# Patient Record
Sex: Male | Born: 1970 | Race: Black or African American | Hispanic: No | Marital: Single | Smoking: Former smoker
Health system: Southern US, Community
[De-identification: ages and names within clinical notes are randomized; demographics above are authoritative.]

## PROBLEM LIST (undated history)

## (undated) DIAGNOSIS — I10 Essential (primary) hypertension: Secondary | ICD-10-CM

## (undated) DIAGNOSIS — J45909 Unspecified asthma, uncomplicated: Secondary | ICD-10-CM

## (undated) DIAGNOSIS — F209 Schizophrenia, unspecified: Secondary | ICD-10-CM

## (undated) DIAGNOSIS — F319 Bipolar disorder, unspecified: Secondary | ICD-10-CM

## (undated) HISTORY — PX: KNEE SURGERY: SHX244

---

## 2005-07-16 ENCOUNTER — Emergency Department (HOSPITAL_COMMUNITY): Admission: EM | Admit: 2005-07-16 | Discharge: 2005-07-16 | Payer: Self-pay | Admitting: Emergency Medicine

## 2006-03-30 ENCOUNTER — Emergency Department (HOSPITAL_COMMUNITY): Admission: EM | Admit: 2006-03-30 | Discharge: 2006-03-30 | Payer: Self-pay | Admitting: Emergency Medicine

## 2006-10-28 ENCOUNTER — Emergency Department (HOSPITAL_COMMUNITY): Admission: EM | Admit: 2006-10-28 | Discharge: 2006-10-28 | Payer: Self-pay | Admitting: Emergency Medicine

## 2007-04-14 ENCOUNTER — Emergency Department: Payer: Self-pay | Admitting: Unknown Physician Specialty

## 2008-01-08 ENCOUNTER — Emergency Department (HOSPITAL_COMMUNITY): Admission: EM | Admit: 2008-01-08 | Discharge: 2008-01-08 | Payer: Self-pay | Admitting: Emergency Medicine

## 2008-09-23 ENCOUNTER — Emergency Department (HOSPITAL_COMMUNITY): Admission: EM | Admit: 2008-09-23 | Discharge: 2008-09-23 | Payer: Self-pay | Admitting: Emergency Medicine

## 2008-11-13 ENCOUNTER — Other Ambulatory Visit: Payer: Self-pay

## 2008-11-14 ENCOUNTER — Ambulatory Visit: Payer: Self-pay | Admitting: Psychiatry

## 2008-11-15 ENCOUNTER — Inpatient Hospital Stay (HOSPITAL_COMMUNITY): Admission: AD | Admit: 2008-11-15 | Discharge: 2008-11-16 | Payer: Self-pay | Admitting: Psychiatry

## 2010-10-06 ENCOUNTER — Inpatient Hospital Stay (HOSPITAL_COMMUNITY)
Admission: RE | Admit: 2010-10-06 | Discharge: 2010-10-09 | Payer: Self-pay | Source: Home / Self Care | Attending: Psychiatry | Admitting: Psychiatry

## 2010-10-14 LAB — URINALYSIS, MICROSCOPIC ONLY
Bilirubin Urine: NEGATIVE
Ketones, ur: NEGATIVE mg/dL
Leukocytes, UA: NEGATIVE
Nitrite: NEGATIVE
Protein, ur: 30 mg/dL — AB
Specific Gravity, Urine: 1.008 (ref 1.005–1.030)
Urine Glucose, Fasting: NEGATIVE mg/dL
Urine-Other: NONE SEEN
Urobilinogen, UA: 0.2 mg/dL (ref 0.0–1.0)
pH: 6 (ref 5.0–8.0)

## 2010-10-14 LAB — CBC
HCT: 43.2 % (ref 39.0–52.0)
Hemoglobin: 14.7 g/dL (ref 13.0–17.0)
MCH: 32.7 pg (ref 26.0–34.0)
MCHC: 34 g/dL (ref 30.0–36.0)
MCV: 96 fL (ref 78.0–100.0)
Platelets: 143 10*3/uL — ABNORMAL LOW (ref 150–400)
RBC: 4.5 MIL/uL (ref 4.22–5.81)
RDW: 13.5 % (ref 11.5–15.5)
WBC: 6.5 10*3/uL (ref 4.0–10.5)

## 2010-10-14 LAB — DRUGS OF ABUSE SCREEN W/O ALC, ROUTINE URINE
Amphetamine Screen, Ur: NEGATIVE
Barbiturate Quant, Ur: NEGATIVE
Benzodiazepines.: NEGATIVE
Cocaine Metabolites: NEGATIVE
Creatinine,U: 55.2 mg/dL
Marijuana Metabolite: NEGATIVE
Methadone: NEGATIVE
Opiate Screen, Urine: NEGATIVE
Phencyclidine (PCP): NEGATIVE
Propoxyphene: NEGATIVE

## 2010-10-14 LAB — COMPREHENSIVE METABOLIC PANEL
ALT: 18 U/L (ref 0–53)
AST: 21 U/L (ref 0–37)
Albumin: 3.7 g/dL (ref 3.5–5.2)
Alkaline Phosphatase: 60 U/L (ref 39–117)
BUN: 14 mg/dL (ref 6–23)
CO2: 30 mEq/L (ref 19–32)
Calcium: 8.6 mg/dL (ref 8.4–10.5)
Chloride: 106 mEq/L (ref 96–112)
Creatinine, Ser: 1.27 mg/dL (ref 0.4–1.5)
GFR calc Af Amer: >60 mL/min (ref >60)
GFR calc non Af Amer: >60 mL/min (ref >60)
Glucose, Bld: 96 mg/dL (ref 70–99)
Potassium: 4.6 mEq/L (ref 3.5–5.1)
Sodium: 140 mEq/L (ref 135–145)
Total Bilirubin: 0.5 mg/dL (ref 0.3–1.2)
Total Protein: 6.8 g/dL (ref 6.0–8.3)

## 2010-10-14 LAB — TSH: TSH: 1.491 u[IU]/mL (ref 0.350–4.500)

## 2010-10-14 LAB — ETHANOL: Alcohol, Ethyl (B): <5 mg/dL (ref 0–10)

## 2010-12-17 ENCOUNTER — Emergency Department (HOSPITAL_COMMUNITY)
Admission: EM | Admit: 2010-12-17 | Discharge: 2010-12-17 | Disposition: A | Discharge status: Home / Self Care | Payer: Medicaid Other | Attending: Emergency Medicine | Admitting: Emergency Medicine

## 2010-12-17 DIAGNOSIS — F209 Schizophrenia, unspecified: Secondary | ICD-10-CM | POA: Insufficient documentation

## 2010-12-17 LAB — ETHANOL: Alcohol, Ethyl (B): <5 mg/dL (ref 0–10)

## 2010-12-17 LAB — URINALYSIS, ROUTINE W REFLEX MICROSCOPIC
Bilirubin Urine: NEGATIVE
Glucose, UA: NEGATIVE mg/dL
Hgb urine dipstick: NEGATIVE
Ketones, ur: NEGATIVE mg/dL
Nitrite: NEGATIVE
Protein, ur: NEGATIVE mg/dL
Specific Gravity, Urine: 1.014 (ref 1.005–1.030)
Urobilinogen, UA: 0.2 mg/dL (ref 0.0–1.0)
pH: 7.5 (ref 5.0–8.0)

## 2010-12-17 LAB — RAPID URINE DRUG SCREEN, HOSP PERFORMED
Amphetamines: NOT DETECTED
Barbiturates: NOT DETECTED
Benzodiazepines: NOT DETECTED
Cocaine: NOT DETECTED
Opiates: NOT DETECTED
Tetrahydrocannabinol: NOT DETECTED

## 2010-12-17 LAB — CBC
HCT: 44 % (ref 39.0–52.0)
Hemoglobin: 14.9 g/dL (ref 13.0–17.0)
MCH: 32.2 pg (ref 26.0–34.0)
MCHC: 33.9 g/dL (ref 30.0–36.0)
MCV: 95 fL (ref 78.0–100.0)
Platelets: 163 10*3/uL (ref 150–400)
RBC: 4.63 MIL/uL (ref 4.22–5.81)
RDW: 12.8 % (ref 11.5–15.5)
WBC: 5.7 10*3/uL (ref 4.0–10.5)

## 2010-12-17 LAB — BASIC METABOLIC PANEL
BUN: 17 mg/dL (ref 6–23)
CO2: 26 mEq/L (ref 19–32)
Calcium: 9 mg/dL (ref 8.4–10.5)
Chloride: 105 mEq/L (ref 96–112)
Creatinine, Ser: 1.35 mg/dL (ref 0.4–1.5)
GFR calc Af Amer: >60 mL/min (ref >60)
GFR calc non Af Amer: 59 mL/min — ABNORMAL LOW (ref >60)
Glucose, Bld: 102 mg/dL — ABNORMAL HIGH (ref 70–99)
Potassium: 4.1 mEq/L (ref 3.5–5.1)
Sodium: 138 mEq/L (ref 135–145)

## 2010-12-17 LAB — DIFFERENTIAL
Basophils Absolute: 0 10*3/uL (ref 0.0–0.1)
Basophils Relative: 1 % (ref 0–1)
Eosinophils Absolute: 0.4 10*3/uL (ref 0.0–0.7)
Eosinophils Relative: 7 % — ABNORMAL HIGH (ref 0–5)
Lymphocytes Relative: 52 % — ABNORMAL HIGH (ref 12–46)
Lymphs Abs: 3 10*3/uL (ref 0.7–4.0)
Monocytes Absolute: 0.3 10*3/uL (ref 0.1–1.0)
Monocytes Relative: 5 % (ref 3–12)
Neutro Abs: 2 10*3/uL (ref 1.7–7.7)
Neutrophils Relative %: 35 % — ABNORMAL LOW (ref 43–77)

## 2011-01-14 LAB — BASIC METABOLIC PANEL
BUN: 7 mg/dL (ref 6–23)
CO2: 28 mEq/L (ref 19–32)
Calcium: 9.2 mg/dL (ref 8.4–10.5)
Chloride: 99 mEq/L (ref 96–112)
Creatinine, Ser: 1.01 mg/dL (ref 0.4–1.5)
GFR calc Af Amer: >60 mL/min (ref >60)
GFR calc non Af Amer: >60 mL/min (ref >60)
Glucose, Bld: 106 mg/dL — ABNORMAL HIGH (ref 70–99)
Potassium: 3.9 mEq/L (ref 3.5–5.1)
Sodium: 136 mEq/L (ref 135–145)

## 2011-01-14 LAB — DIFFERENTIAL
Basophils Absolute: 0 10*3/uL (ref 0.0–0.1)
Basophils Relative: 0 % (ref 0–1)
Eosinophils Absolute: 0.1 10*3/uL (ref 0.0–0.7)
Eosinophils Relative: 1 % (ref 0–5)
Lymphocytes Relative: 41 % (ref 12–46)
Lymphs Abs: 2.5 10*3/uL (ref 0.7–4.0)
Monocytes Absolute: 0.6 10*3/uL (ref 0.1–1.0)
Monocytes Relative: 9 % (ref 3–12)
Neutro Abs: 3.1 10*3/uL (ref 1.7–7.7)
Neutrophils Relative %: 49 % (ref 43–77)

## 2011-01-14 LAB — CBC
HCT: 45.5 % (ref 39.0–52.0)
Hemoglobin: 15.6 g/dL (ref 13.0–17.0)
MCHC: 34.4 g/dL (ref 30.0–36.0)
MCV: 97.4 fL (ref 78.0–100.0)
Platelets: 151 10*3/uL (ref 150–400)
RBC: 4.67 MIL/uL (ref 4.22–5.81)
RDW: 13.4 % (ref 11.5–15.5)
WBC: 6.3 10*3/uL (ref 4.0–10.5)

## 2011-01-14 LAB — RAPID URINE DRUG SCREEN, HOSP PERFORMED
Amphetamines: NOT DETECTED
Barbiturates: NOT DETECTED
Benzodiazepines: NOT DETECTED
Cocaine: NOT DETECTED
Opiates: NOT DETECTED
Tetrahydrocannabinol: NOT DETECTED

## 2011-01-14 LAB — ETHANOL: Alcohol, Ethyl (B): <5 mg/dL (ref 0–10)

## 2011-02-11 NOTE — H&P (Signed)
[name], York             ACCOUNT NO.:  [id number]   MEDICAL RECORD NO.:  [id number]          PATIENT TYPE:  IPS   LOCATION:  0406                          FACILITY:  [facility]   PHYSICIAN:  Anselm Jungling, MD  DATE OF BIRTH:  November 16, 1970   DATE OF ADMISSION:  11/14/2008  DATE OF DISCHARGE:                       PSYCHIATRIC ADMISSION ASSESSMENT   TIME:  11:55 a.m.   IDENTIFYING INFORMATION:  A 40 year old single male.  This is a  voluntary admission.   HISTORY OF PRESENT ILLNESS:  First [facility] admission for this 40 year old  who presented in the emergency room accompanied by police after he had  called them several times during the day.  On the prior day that he had  been to mental health a couple of times looking for help,  recognizing  that he needed to take his medications and had gotten them filled at  mental health, but not yet started them.  He says today that he stopped  his medications back in October, has not been taking anything regularly.  Wanted to see if he could function well without them.  Says that when he  does not take his medications, he begins to see things, hear things that  people are talking about him.  He also feels that people are looking at  him and getting ideas about him, that they are plotting against him.  He  does have some insight and recognizes that he has paranoia and would  like to get back on his medications.  He was pacing and mildly agitated  in the emergency room, and required 20 mg of Geodon.  He has been calm  and cooperative here on the unit.   PAST PSYCHIATRIC HISTORY:  First [facility] admission.  He is followed at the  [facility] by Dr. Hortencia Pilar.  He reports a history of  schizoaffective disorder.  He denies a history of substance abuse.  He  denies a history of suicide attempts.  He has a history of prior  admissions at [facility] last in 2007.   SOCIAL HISTORY:  Single male, lives in his own apartment.   Parents live  nearby.  He says they are supportive.  Feels that he has good  relationships with his family.  No income.  No job at this time, but  states that he would like to work.  Does have Medicaid insurance.   FAMILY HISTORY:  Not available.   MEDICAL HISTORY:  No regular primary care Ryan York.   MEDICAL PROBLEMS:  He denies any medical problems or somatic complaints.   CURRENT MEDICATIONS:  1. Risperdal 4 mg tab 1 tab q.h.s. and 1/2 tablet during the day.  2. Depakote 500 mg in the morning and 1500 mg at bedtime.  3. Cogentin, dose not clear.   DRUG ALLERGIES:  NONE.   PHYSICAL EXAMINATION:  Physical exam was done in the emergency room as  noted in the record.  He initially presented there on November 13, 2008  and was unable to get a bed until late on November 14, 2008.  While in  the emergency room, he  received 20 mg of Geodon then later Risperdal 2  mg along with Depakote 500 mg this morning, November 15, 2008.   LABORATORY DATA:  Diagnostic studies were done in the emergency room.  His BMET was within normal limits.  His urine drug screen negative for  all substances.  Ethanol level less than 5.  CBC within normal limits.   MENTAL STATUS EXAM:  Fully alert male, cooperative with a guarded  affect.  Speech is normal, nonpressured.  No response latency.  Candid  about his history.  Recognizes that he has hallucinations.  Recognizes  paranoia.  Somewhat embarrassed about his behavior.  Mood is neutral  today.  Initially he is quite guarded, but warms up quickly with  conversation and becomes less guarded.  More forthcoming.  He has been  up and moving about the unit cooperating and participating well in all  activities.  Thought processes are logical.  He does not appear to be  internally distracted at this time.  No delusional thoughts.  Denying  any suicidal or homicidal thought or dangerous ideas.  Cognition is  completely preserved.  Immediate recent and remote memory  are intact.  Insight fairly good.   AXIS I:  Bipolar disorder versus schizoaffective disorder.  AXIS II:  Deferred.  AXIS III:  No diagnosis.  AXIS IV:  Deferred.  AXIS V:  Current 46, past year not known.   PLAN:  The plan is to voluntarily admit him to stabilize him.  Our case  manager will contact  family and hear their concerns.  He is on our  stabilization team.  We are going to evaluate his support system and get  some additional history.  I have started him back on Depakote 1000 mg  q.h.s. and 500 q.a.m., Risperdal 2 mg p.o. q.a.m. and 4 mg p.o. q.h.s.,  and Cogentin 1 mg b.i.d.      Ryan York. Lorin Picket, N.P.      Anselm Jungling, MD  Electronically Signed    MAS/MEDQ  D:  11/15/2008  T:  11/15/2008  Job:  [id number]

## 2011-06-24 LAB — CBC
HCT: 45.3
Hemoglobin: 15.4
MCHC: 34
MCV: 95.1
Platelets: 141 — ABNORMAL LOW
RBC: 4.77
RDW: 12.8
WBC: 3.8 — ABNORMAL LOW

## 2011-06-24 LAB — DIFFERENTIAL
Basophils Absolute: 0
Basophils Relative: 0
Eosinophils Absolute: 0.3
Eosinophils Relative: 8 — ABNORMAL HIGH
Lymphocytes Relative: 25
Lymphs Abs: 0.9
Monocytes Absolute: 0.3
Monocytes Relative: 9
Neutro Abs: 2.1
Neutrophils Relative %: 57

## 2011-06-24 LAB — BASIC METABOLIC PANEL
BUN: 19
CO2: 25
Calcium: 8.8
Chloride: 105
Creatinine, Ser: 1.33
GFR calc Af Amer: >60
GFR calc non Af Amer: >60
Glucose, Bld: 106 — ABNORMAL HIGH
Potassium: 3.6
Sodium: 138

## 2011-06-24 LAB — RAPID URINE DRUG SCREEN, HOSP PERFORMED
Amphetamines: NOT DETECTED
Barbiturates: NOT DETECTED
Benzodiazepines: POSITIVE — AB
Cocaine: NOT DETECTED
Opiates: NOT DETECTED
Tetrahydrocannabinol: NOT DETECTED

## 2011-06-24 LAB — ETHANOL: Alcohol, Ethyl (B): <5

## 2012-04-17 ENCOUNTER — Encounter (HOSPITAL_COMMUNITY): Payer: Self-pay | Admitting: Emergency Medicine

## 2012-04-17 ENCOUNTER — Emergency Department (HOSPITAL_COMMUNITY)
Admission: EM | Admit: 2012-04-17 | Discharge: 2012-04-19 | Disposition: A | Discharge status: Other Acute Inpatient Hospital | Payer: Medicaid Other | Attending: Emergency Medicine | Admitting: Emergency Medicine

## 2012-04-17 DIAGNOSIS — Z8659 Personal history of other mental and behavioral disorders: Secondary | ICD-10-CM | POA: Insufficient documentation

## 2012-04-17 DIAGNOSIS — J45909 Unspecified asthma, uncomplicated: Secondary | ICD-10-CM | POA: Insufficient documentation

## 2012-04-17 DIAGNOSIS — F17200 Nicotine dependence, unspecified, uncomplicated: Secondary | ICD-10-CM | POA: Insufficient documentation

## 2012-04-17 DIAGNOSIS — F319 Bipolar disorder, unspecified: Secondary | ICD-10-CM | POA: Insufficient documentation

## 2012-04-17 DIAGNOSIS — F29 Unspecified psychosis not due to a substance or known physiological condition: Secondary | ICD-10-CM

## 2012-04-17 HISTORY — DX: Unspecified asthma, uncomplicated: J45.909

## 2012-04-17 HISTORY — DX: Bipolar disorder, unspecified: F31.9

## 2012-04-17 HISTORY — DX: Schizophrenia, unspecified: F20.9

## 2012-04-17 LAB — CBC WITH DIFFERENTIAL/PLATELET
Basophils Absolute: 0.1 10*3/uL (ref 0.0–0.1)
Basophils Relative: 1 % (ref 0–1)
Eosinophils Absolute: 0.4 10*3/uL (ref 0.0–0.7)
Eosinophils Relative: 8 % — ABNORMAL HIGH (ref 0–5)
HCT: 40.7 % (ref 39.0–52.0)
Hemoglobin: 14.3 g/dL (ref 13.0–17.0)
Lymphocytes Relative: 43 % (ref 12–46)
Lymphs Abs: 2.4 10*3/uL (ref 0.7–4.0)
MCH: 34.1 pg — ABNORMAL HIGH (ref 26.0–34.0)
MCHC: 35.1 g/dL (ref 30.0–36.0)
MCV: 97.1 fL (ref 78.0–100.0)
Monocytes Absolute: 0.4 10*3/uL (ref 0.1–1.0)
Monocytes Relative: 7 % (ref 3–12)
Neutro Abs: 2.3 10*3/uL (ref 1.7–7.7)
Neutrophils Relative %: 41 % — ABNORMAL LOW (ref 43–77)
Platelets: 149 10*3/uL — ABNORMAL LOW (ref 150–400)
RBC: 4.19 MIL/uL — ABNORMAL LOW (ref 4.22–5.81)
RDW: 12.5 % (ref 11.5–15.5)
WBC: 5.6 10*3/uL (ref 4.0–10.5)

## 2012-04-17 LAB — BASIC METABOLIC PANEL
BUN: 14 mg/dL (ref 6–23)
CO2: 25 mEq/L (ref 19–32)
Calcium: 9 mg/dL (ref 8.4–10.5)
Chloride: 103 mEq/L (ref 96–112)
Creatinine, Ser: 1.03 mg/dL (ref 0.50–1.35)
GFR calc Af Amer: >90 mL/min (ref >90)
GFR calc non Af Amer: 90 mL/min — ABNORMAL LOW (ref >90)
Glucose, Bld: 96 mg/dL (ref 70–99)
Potassium: 4 mEq/L (ref 3.5–5.1)
Sodium: 138 mEq/L (ref 135–145)

## 2012-04-17 LAB — RAPID URINE DRUG SCREEN, HOSP PERFORMED
Amphetamines: NOT DETECTED
Barbiturates: NOT DETECTED
Benzodiazepines: NOT DETECTED
Cocaine: NOT DETECTED
Opiates: NOT DETECTED
Tetrahydrocannabinol: NOT DETECTED

## 2012-04-17 LAB — ETHANOL: Alcohol, Ethyl (B): <11 mg/dL (ref 0–11)

## 2012-04-17 MED ORDER — ONDANSETRON HCL 4 MG PO TABS: 4.0000 mg | ORAL_TABLET | Freq: Three times a day (TID) | ORAL | Status: DC | PRN

## 2012-04-17 MED ORDER — NICOTINE 21 MG/24HR TD PT24
21.0000 mg | MEDICATED_PATCH | Freq: Every day | TRANSDERMAL | Status: DC
  Administered 2012-04-17 – 2012-04-19 (×3): 21 mg via TRANSDERMAL
  Filled 2012-04-17 (×3): qty 1

## 2012-04-17 MED ORDER — ZIPRASIDONE HCL 20 MG PO CAPS
20.0000 mg | ORAL_CAPSULE | Freq: Two times a day (BID) | ORAL | Status: DC
  Administered 2012-04-17 – 2012-04-19 (×5): 20 mg via ORAL
  Filled 2012-04-17 (×4): qty 1

## 2012-04-17 MED ORDER — ALUM & MAG HYDROXIDE-SIMETH 200-200-20 MG/5ML PO SUSP: 30.0000 mL | ORAL | Status: DC | PRN

## 2012-04-17 MED ORDER — QUETIAPINE FUMARATE 100 MG PO TABS
100.0000 mg | ORAL_TABLET | Freq: Two times a day (BID) | ORAL | Status: DC
  Administered 2012-04-17 – 2012-04-19 (×5): 100 mg via ORAL
  Filled 2012-04-17 (×5): qty 1

## 2012-04-17 NOTE — ED Notes (Signed)
Pt states voices tell him to sleep with women and to chase his children around the house.

## 2012-04-17 NOTE — ED Notes (Signed)
Telepsych in progress. 

## 2012-04-17 NOTE — BH Assessment (Signed)
Assessment Note   Ryan York is an 41 y.o. male who presented to [facility] Emergency Department with the chief complaint of experiencing auditory hallucinations. Patient reported to Clinical research associate that he was diagnosed with Schizophrenia in 1995, stating "That's when I started hearing the voices". Patient stated to writer that he hears 20 different voices in his head that command him to hurt himself. "I live in a hostile environment full of druggies and prostitutes. No one ever told the truth about Schizophrenia and telepathy. The voices be trying to inflict pain and I can't take it no more." Patient reported that his only way of coping with the voices is by consuming his Seroquel because it puts him to sleep. Patient stated that the voices are "interrogating me. They wanna see me go to jail or make babies with your girl." Patient reported to Clinical research associate that he began hearing the voices through objects such as the speaker or electronics beginning in 1999. Patient desires for the voices to stop and to have his medications evaluated. Patient denies SI/HI/VH at this time. Patient has received past inpatient treatment before at [facility], [facility], and [facility].   Axis I: Schizophrenia, Bipolar Disorder  Axis II: Deferred Axis III:  Past Medical History  Diagnosis Date  . Bipolar disorder   . Schizophrenia   . Asthma    Axis IV: other psychosocial or environmental problems, problems related to social environment and problems with access to health care services Axis V: 21-30 behavior considerably influenced by delusions or hallucinations OR serious impairment in judgment, communication OR inability to function in almost all areas  Past Medical History:  Past Medical History  Diagnosis Date  . Bipolar disorder   . Schizophrenia   . Asthma     Past Surgical History  Procedure Date  . Knee surgery     Family History: No family history on file.  Social History:  reports that he has been smoking  Cigarettes.  He has never used smokeless tobacco. He reports that he drinks alcohol. He reports that he does not use illicit drugs.  Additional Social History:  Alcohol / Drug Use Pain Medications: See MAR Prescriptions: See MAR Over the Counter: See MAR History of alcohol / drug use?: No history of alcohol / drug abuse (Pt denies to assessor)  CIWA: CIWA-Ar BP: 121/81 mmHg Pulse Rate: 63  COWS:    Allergies: No Known Allergies  Home Medications:  (Not in a hospital admission)  OB/GYN Status:  No LMP for male patient.  General Assessment Data Location of Assessment: [facility] ED Living Arrangements: Alone Can pt return to current living arrangement?: Yes Admission Status: Voluntary Is patient capable of signing voluntary admission?: Yes Transfer from: Acute Hospital Referral Source: Self/Family/Friend  Education Status Is patient currently in school?: No  Risk to self Suicidal Ideation: No Suicidal Intent: No Is patient at risk for suicide?: No Suicidal Plan?: No Access to Means: No What has been your use of drugs/alcohol within the last 12 months?: Pt denies SA use Previous Attempts/Gestures: No How many times?: 0  Other Self Harm Risks: None Reported Triggers for Past Attempts: None known Intentional Self Injurious Behavior: None Family Suicide History: No Recent stressful life event(s): Other (Comment) (Recent severe Auditory Hallucinations) Persecutory voices/beliefs?: No Depression: Yes Depression Symptoms: Loss of interest in usual pleasures;Feeling worthless/self pity;Isolating Substance abuse history and/or treatment for substance abuse?: No Suicide prevention information given to non-admitted patients: Not applicable  Risk to Others Homicidal Ideation: No Thoughts of  Harm to Others: No Current Homicidal Intent: No Current Homicidal Plan: No Access to Homicidal Means: No Identified Victim: N/A History of harm to others?: No Assessment of Violence: None  Noted Violent Behavior Description: Pt is calm and cooperative Does patient have access to weapons?: No Criminal Charges Pending?: No (Pt is on probation ) Does patient have a court date: No  Psychosis Hallucinations: Auditory;With command (Voices command pt to hurt himself) Delusions: None noted  Mental Status Report Appear/Hygiene: Disheveled Eye Contact: Poor Motor Activity: Freedom of movement Speech: Logical/coherent Level of Consciousness: Quiet/awake Mood: Apprehensive Affect: Appropriate to circumstance Anxiety Level: None Thought Processes: Coherent;Relevant Judgement: Impaired Orientation: Person;Time;Place;Situation Obsessive Compulsive Thoughts/Behaviors: None  Cognitive Functioning Concentration: Decreased Memory: Recent Intact;Remote Intact IQ: Average Insight: Fair Impulse Control: Poor Appetite: Good Weight Loss: 0  Weight Gain: 0  Sleep: Increased Total Hours of Sleep: 16  Vegetative Symptoms: None  ADLScreening [facility] Assessment Services) Patient's cognitive ability adequate to safely complete daily activities?: Yes Patient able to express need for assistance with ADLs?: Yes Independently performs ADLs?: Yes  Abuse/Neglect [facility]) Physical Abuse: Denies Verbal Abuse: Denies Sexual Abuse: Denies  Prior Inpatient Therapy Prior Inpatient Therapy: Yes Prior Therapy Dates:  (Dates unknown ) Prior Therapy Facilty/Provider(s): [facility], [facility] Facility in [place] Reason for Treatment: Psychosis  Prior Outpatient Therapy Prior Outpatient Therapy: No  ADL Screening (condition at time of admission) Patient's cognitive ability adequate to safely complete daily activities?: Yes Patient able to express need for assistance with ADLs?: Yes Independently performs ADLs?: Yes Weakness of Legs: None Weakness of Arms/Hands: None  Home Assistive Devices/Equipment Home Assistive Devices/Equipment: None  Therapy Consults (therapy consults require a physician  order) PT Evaluation Needed: No OT Evalulation Needed: No SLP Evaluation Needed: No Abuse/Neglect Assessment (Assessment to be complete while patient is alone) Physical Abuse: Denies Verbal Abuse: Denies Sexual Abuse: Denies Exploitation of patient/patient's resources: Denies Self-Neglect: Denies Values / Beliefs Cultural Requests During Hospitalization: None Spiritual Requests During Hospitalization: None Consults Spiritual Care Consult Needed: No Social Work Consult Needed: No      Additional Information 1:1 In Past 12 Months?: No CIRT Risk: No Elopement Risk: No Does patient have medical clearance?: Yes     Disposition: Referral for inpatient treatment program. Disposition Disposition of Patient: Inpatient treatment program Type of inpatient treatment program: Adult  On Site Evaluation by:   Reviewed with Physician:     Paulino Door, Shifa Brisbon C 04/17/2012 1:14 PM

## 2012-04-17 NOTE — ED Notes (Signed)
[id number]> Expected date:<BR> Expected time:<BR> Means of arrival:<BR> Comments:<BR>

## 2012-04-17 NOTE — ED Notes (Addendum)
Belongings received. Three bags placed in LOCKER 26 and one large duffle bag at desk.

## 2012-04-17 NOTE — ED Notes (Signed)
Call placed to Telepsych around 12 noon

## 2012-04-17 NOTE — ED Notes (Signed)
Pt placed in blue scrubs via [employer]. Security called to wand pt and belongings.

## 2012-04-17 NOTE — ED Notes (Signed)
ACT assessment completed 

## 2012-04-17 NOTE — ED Provider Notes (Addendum)
History     CSN: [id number]  Arrival date & time 04/17/12  0123   First MD Initiated Contact with Patient 04/17/12 [phone]      Chief Complaint  Patient presents with  . Hearing voices     (Consider location/radiation/quality/duration/timing/severity/associated sxs/prior treatment) HPI Level V caveat: This psychotic, poverty of speech. This is a 41 year old black male with a long-standing history of schizophrenia. He states he continues to hear voices despite being on Seroquel. He is not sure why he came in particularly this morning as there has not been an acute change. He is vague about what the voices say to him. He is reticent to give a more detailed history, stating that being in the hospital environment distracts him from being able to carry on a conversation.  Past Medical History  Diagnosis Date  . Bipolar disorder   . Schizophrenia   . Asthma     Past Surgical History  Procedure Date  . Knee surgery     No family history on file.  History  Substance Use Topics  . Smoking status: Current Some Day Smoker  . Smokeless tobacco: Never Used  . Alcohol Use: Yes      Review of Systems  Unable to perform ROS   Allergies  Review of patient's allergies indicates no known allergies.  Home Medications   Current Outpatient Rx  Name Route Sig Dispense Refill  . QUETIAPINE FUMARATE 100 MG PO TABS Oral Take 100 mg by mouth 2 (two) times daily.       BP 138/87  Pulse 60  Temp 97.3 F (36.3 C) (Oral)  Resp 20  SpO2 93%  Physical Exam General: Well-developed, well-nourished male in no acute distress; appearance consistent with age of record HENT: normocephalic, atraumatic Eyes: pupils equal round and reactive to light; extraocular muscles intact Neck: supple Heart: regular rate and rhythm Lungs: clear to auscultation bilaterally Abdomen: soft; nondistended Extremities: No deformity; full range of motion Neurologic: Awake, alert; motor function intact in all  extremities and symmetric; no facial droop Skin: Warm and dry Psychiatric: Auditory hallucinations; poverty of speech; flat affect    ED Course  Procedures (including critical care time)     MDM   Nursing notes and vitals signs, including pulse oximetry, reviewed.  Summary of this visit's results, reviewed by myself:  Labs:  Results for orders placed during the hospital encounter of 04/17/12  ETHANOL      Component Value Range   Alcohol, Ethyl (B) <11  0 - 11 mg/dL  URINE RAPID DRUG SCREEN (HOSP PERFORMED)      Component Value Range   Opiates NONE DETECTED  NONE DETECTED   Cocaine NONE DETECTED  NONE DETECTED   Benzodiazepines NONE DETECTED  NONE DETECTED   Amphetamines NONE DETECTED  NONE DETECTED   Tetrahydrocannabinol NONE DETECTED  NONE DETECTED   Barbiturates NONE DETECTED  NONE DETECTED  BASIC METABOLIC PANEL      Component Value Range   Sodium 138  135 - 145 mEq/L   Potassium 4.0  3.5 - 5.1 mEq/L   Chloride 103  96 - 112 mEq/L   CO2 25  19 - 32 mEq/L   Glucose, Bld 96  70 - 99 mg/dL   BUN 14  6 - 23 mg/dL   Creatinine, Ser 1.19  0.50 - 1.35 mg/dL   Calcium 9.0  8.4 - 14.7 mg/dL   GFR calc non Af Amer 90 (*) >90 mL/min   GFR calc Af Amer >90  >  90 mL/min  CBC WITH DIFFERENTIAL      Component Value Range   WBC 5.6  4.0 - 10.5 K/uL   RBC 4.19 (*) 4.22 - 5.81 MIL/uL   Hemoglobin 14.3  13.0 - 17.0 g/dL   HCT 98.1  19.1 - 47.8 %   MCV 97.1  78.0 - 100.0 fL   MCH 34.1 (*) 26.0 - 34.0 pg   MCHC 35.1  30.0 - 36.0 g/dL   RDW 29.5  62.1 - 30.8 %   Platelets 149 (*) 150 - 400 K/uL   Neutrophils Relative 41 (*) 43 - 77 %   Lymphocytes Relative 43  12 - 46 %   Monocytes Relative 7  3 - 12 %   Eosinophils Relative 8 (*) 0 - 5 %   Basophils Relative 1  0 - 1 %   Neutro Abs 2.3  1.7 - 7.7 K/uL   Lymphs Abs 2.4  0.7 - 4.0 K/uL   Monocytes Absolute 0.4  0.1 - 1.0 K/uL   Eosinophils Absolute 0.4  0.0 - 0.7 K/uL   Basophils Absolute 0.1  0.0 - 0.1 K/uL   RBC  Morphology TEARDROP CELLS     WBC Morphology ATYPICAL LYMPHOCYTES               Hanley Seamen, MD 04/17/12 [id number]  Hanley Seamen, MD 04/17/12 [phone]

## 2012-04-17 NOTE — ED Notes (Signed)
As per EMS, pt requesting mental health help.

## 2012-04-17 NOTE — ED Notes (Signed)
Pt recommended to inpatient tx per ACT team. Will pursue a bed at [facility].

## 2012-04-17 NOTE — ED Notes (Signed)
Received pt to TCU Rm 26, ambulatory.

## 2012-04-17 NOTE — ED Notes (Signed)
Pt states he is looking for adjustment in medication to help with mental health issues.

## 2012-04-17 NOTE — ED Notes (Signed)
Acuity level changed due to pt hearing voices and requesting seroquel be adjusted per Dr Read Drivers

## 2012-04-17 NOTE — ED Provider Notes (Signed)
  Physical Exam  BP 121/81  Pulse 63  Temp 97.5 F (36.4 C) (Oral)  Resp 18  SpO2 94%  Physical Exam  ED Course  Procedures  MDM patietn has had a telepsych consult. recomending inpatient treatment. Will had geodon. 20 mg po bid  Juliet Rude. Rubin Payor, MD 04/17/12 1454

## 2012-04-17 NOTE — ED Notes (Signed)
YNW:[id number]<OZ> Expected date:04/17/12<BR> Expected time: 1:07 AM<BR> Means of arrival:Ambulance<BR> Comments:<BR> Mental health issues

## 2012-04-18 NOTE — ED Provider Notes (Addendum)
Pt resting quietly, nad. Vitals normal. Discussed w act team - awaiting psych placement.    Suzi Roots, MD 04/18/12 [phone]  Pt alert, content, vitals normal, nad. Awaiting psych placement.   Suzi Roots, MD 04/19/12 [phone]

## 2012-04-19 ENCOUNTER — Encounter (HOSPITAL_COMMUNITY): Payer: Self-pay | Admitting: Emergency Medicine

## 2012-04-19 ENCOUNTER — Inpatient Hospital Stay (HOSPITAL_COMMUNITY)
Admission: AD | Admit: 2012-04-19 | Discharge: 2012-04-27 | DRG: 885 | Disposition: A | Discharge status: Home / Self Care | Payer: Medicaid Other | Source: Ambulatory Visit | Attending: Psychiatry | Admitting: Psychiatry

## 2012-04-19 DIAGNOSIS — J45909 Unspecified asthma, uncomplicated: Secondary | ICD-10-CM | POA: Diagnosis present

## 2012-04-19 DIAGNOSIS — F259 Schizoaffective disorder, unspecified: Principal | ICD-10-CM | POA: Diagnosis present

## 2012-04-19 DIAGNOSIS — F250 Schizoaffective disorder, bipolar type: Secondary | ICD-10-CM

## 2012-04-19 DIAGNOSIS — F1020 Alcohol dependence, uncomplicated: Secondary | ICD-10-CM | POA: Diagnosis present

## 2012-04-19 MED ORDER — BENZTROPINE MESYLATE 1 MG PO TABS: 1.0000 mg | ORAL_TABLET | Freq: Two times a day (BID) | ORAL | Status: DC | PRN

## 2012-04-19 MED ORDER — QUETIAPINE FUMARATE ER 300 MG PO TB24
300.0000 mg | ORAL_TABLET | Freq: Every day | ORAL | Status: DC
  Administered 2012-04-19 – 2012-04-26 (×8): 300 mg via ORAL
  Filled 2012-04-19 (×10): qty 1

## 2012-04-19 MED ORDER — ALUM & MAG HYDROXIDE-SIMETH 200-200-20 MG/5ML PO SUSP: 30.0000 mL | ORAL | Status: DC | PRN

## 2012-04-19 MED ORDER — ACETAMINOPHEN 325 MG PO TABS
650.0000 mg | ORAL_TABLET | Freq: Four times a day (QID) | ORAL | Status: DC | PRN
  Administered 2012-04-23 – 2012-04-25 (×2): 650 mg via ORAL

## 2012-04-19 MED ORDER — MAGNESIUM HYDROXIDE 400 MG/5ML PO SUSP: 30.0000 mL | Freq: Every day | ORAL | Status: DC | PRN

## 2012-04-19 NOTE — [facility] Counselor (Signed)
Patient accepted to [facility] by Lynann Bologna, NP to Dr. Harvie Heck Reidling. Patient's bed assignment is 402-2. Writer informed the EDP-Dr. Linwood Dibbles of patients disposition. Also informed patient's nurse of the disposition. Patient's support paperwork completed and faxed to [facility].

## 2012-04-19 NOTE — Progress Notes (Signed)
Patient ID: Ryan York, male   DOB: September 28, 1971, 41 y.o.   MRN: [id number] Pt admitted to [facility] for Schizophrenia and depression. Pt with hx of multiple inpatient admissions. Pt is voluntary, stating he "came in because the voices got so bad". Pt states he has been hearing command voices telling him to "[facility], you're not wanted here". Pt also states the voices are telling him to sleep with multiple partners so he can have many children. Pt states he feels them (voices) touch him at times as well. Pt denies SI or HI at this time. Pt pleasant and cooperative during assessment, brightens on approach but endorses depression. Pt currently homeless and states his family does not live in this area. Pt declines to add any emergency contacts at this time stating he does not want to worry his mom. Pt oriented to unit rules and is agreeable to them. Will monitor patient Q 15 minutes for safety.

## 2012-04-19 NOTE — Tx Team (Signed)
Initial Interdisciplinary Treatment Plan  PATIENT STRENGTHS: (choose at least two) Ability for insight Active sense of humor Average or above average intelligence Capable of independent living Communication skills General fund of knowledge Motivation for treatment/growth  PATIENT STRESSORS: Financial difficulties   PROBLEM LIST: Problem List/Patient Goals Date to be addressed Date deferred Reason deferred Estimated date of resolution  Schizophrenia 04/19/12     Depression 04/19/12                                                DISCHARGE CRITERIA:  Adequate post-discharge living arrangements Improved stabilization in mood, thinking, and/or behavior Motivation to continue treatment in a less acute level of care Need for constant or close observation no longer present Safe-care adequate arrangements made Verbal commitment to aftercare and medication compliance  PRELIMINARY DISCHARGE PLAN: Placement in alternative living arrangements  PATIENT/FAMIILY INVOLVEMENT: This treatment plan has been presented to and reviewed with the patient, Ryan York, and/or family member, .  The patient and family have been given the opportunity to ask questions and make suggestions.  [name] 04/19/2012, 8:21 PM

## 2012-04-20 ENCOUNTER — Encounter (HOSPITAL_COMMUNITY): Payer: Self-pay | Admitting: Psychiatry

## 2012-04-20 DIAGNOSIS — F250 Schizoaffective disorder, bipolar type: Secondary | ICD-10-CM | POA: Diagnosis present

## 2012-04-20 DIAGNOSIS — F1010 Alcohol abuse, uncomplicated: Secondary | ICD-10-CM

## 2012-04-20 MED ORDER — LORAZEPAM 2 MG/ML IJ SOLN
INTRAMUSCULAR | Status: AC
  Filled 2012-04-20: qty 1

## 2012-04-20 MED ORDER — LORAZEPAM 1 MG PO TABS
ORAL_TABLET | ORAL | Status: AC
  Filled 2012-04-20: qty 2

## 2012-04-20 MED ORDER — HALOPERIDOL 5 MG PO TABS: 5.0000 mg | ORAL_TABLET | Freq: Three times a day (TID) | ORAL | Status: DC | PRN

## 2012-04-20 MED ORDER — HALOPERIDOL 5 MG PO TABS
ORAL_TABLET | ORAL | Status: AC
  Filled 2012-04-20: qty 2

## 2012-04-20 MED ORDER — LORAZEPAM 2 MG/ML IJ SOLN: 2.0000 mg | INTRAMUSCULAR | Status: DC | PRN

## 2012-04-20 MED ORDER — HALOPERIDOL 5 MG PO TABS
10.0000 mg | ORAL_TABLET | ORAL | Status: DC | PRN
  Administered 2012-04-20: 10 mg via ORAL

## 2012-04-20 MED ORDER — LORAZEPAM 1 MG PO TABS
2.0000 mg | ORAL_TABLET | ORAL | Status: DC | PRN
  Administered 2012-04-20 – 2012-04-26 (×7): 2 mg via ORAL
  Filled 2012-04-20 (×2): qty 2
  Filled 2012-04-20 (×2): qty 1
  Filled 2012-04-20 (×3): qty 2

## 2012-04-20 MED ORDER — HALOPERIDOL LACTATE 5 MG/ML IJ SOLN
5.0000 mg | Freq: Three times a day (TID) | INTRAMUSCULAR | Status: DC | PRN
  Filled 2012-04-20: qty 2

## 2012-04-20 MED ORDER — HALOPERIDOL LACTATE 5 MG/ML IJ SOLN: 10.0000 mg | INTRAMUSCULAR | Status: DC | PRN

## 2012-04-20 NOTE — Progress Notes (Addendum)
D: Pt in room, sitting on side of bed and looking out the window on approach. Appears blunted and anxious. MHT had approached writer stating the pt had been making inappropriate comments in the hallway and asked that pt be assessed. Pt did state he had gotten upset in hallway, but said, "Im straight now, I just need to rebuke these demons around here.". Continues to endorse AH and does not feel they are any better or worse. Offered PRN for psychotic agitation, but pt refused. Denies SI/HI/VH and contracts for safety.  A: Support and encouragement provided. Safety has been maintained with Q15 minute observation. Writer reminded pt he is in a psych hospital with pts that have many of the same issues as he does and we need him to remain calm so as not to scare his peers. States he will be fine and just needs to unwind. Will monitor closely and if he does not calm, writer will strongly recommend prn.  R: Pt remains safe. Pt remained in room and did appear to calm during interaction. Will continue to monitor closely for psychotic agitation and treat as indicated by MD order. Will continue current POC.   Pt became extremely psychotic and agitated. Was accussing staff and peers of being drug dealers and prostitutes. Believed MHT was his baby's momma. He refused PO prn as he felt like he could self regulate, but writer saw no evidence of this. Discussed with Mallie Darting, PA and prn orders were given for PO or IM if needed. A show of force was displayed and pt reluctantly agreed to take PO without incident.  Writer took HS medication to pt and he appeared to much more calm. Did not appear to be in any acute distress. Will continue to monitor.

## 2012-04-20 NOTE — Progress Notes (Signed)
Patient has been distressed today over the loss of his personal belongings.  His belongings were never sent over from [facility].  He became agitated and called the [employer] re the bag and they responded twice.  The phones had to be cut off so patient wouldn't continually call 911.  He became agitated to the point staff advised him to take some medication which he refused.  After locating the bag, patient went through his belongings and stated that everything was there.  Patient lives at [facility], but wants to leave.  He also wants to disassociate himself from [facility].  He also talked about doing away with his [address].  Patient would like to find another place to stay.  He remains with AH; told staff that he didn't want to stay in the Day Room with "all those prostitutes."  He has flat affect; he can be redirected when agitated.  He denies any SI/HI.    Continue to monitor medication management and MD orders.  15 minutes safety checks maintained.  Patient interacts with staff; has been easily redirected today.

## 2012-04-20 NOTE — [facility] Suicide Risk Assessment (Signed)
Suicide Risk Assessment  Admission Assessment     Demographic factors:  Assessment Details Time of Assessment: Admission Information Obtained From: Patient Current Mental Status:    Loss Factors:  Loss Factors: Financial problems / change in socioeconomic status Historical Factors:    Risk Reduction Factors:  Risk Reduction Factors: Positive coping skills or problem solving skills  CLINICAL FACTORS:   Severe Anxiety and/or Agitation Schizophrenia:   Command hallucinatons Paranoid or undifferentiated type More than one psychiatric diagnosis Currently Psychotic Unstable or Poor Therapeutic Relationship Previous Psychiatric Diagnoses and Treatments  COGNITIVE FEATURES THAT CONTRIBUTE TO RISK:  Closed-mindedness Loss of executive function    SUICIDE RISK:   Mild:  Suicidal ideation of limited frequency, intensity, duration, and specificity.  There are no identifiable plans, no associated intent, mild dysphoria and related symptoms, good self-control (both objective and subjective assessment), few other risk factors, and identifiable protective factors, including available and accessible social support.  PLAN OF CARE: The patient would benefit being started on a long-acting IM antipsychotic medication secondary to history of medication noncompliance  and patient being agitated, refusing to participate in outpatient treatment Will adjust patient's medications to help remit the psychosis Patient to participate in groups Patient will undergo education in regards to his diagnoses treatment and the need for medication compliance    Ryan York 04/20/2012, 11:21 AM

## 2012-04-20 NOTE — Progress Notes (Signed)
Psychoeducational Group Note  Date:  04/20/2012 Time:  1100 Group Topic/Focus:  Recovery Goals:   The focus of this group is to identify appropriate goals for recovery and establish a plan to achieve them.  Participation Level:  Did Not Attend  Participation Quality:  Did not attend group  Affect:  Did not attend group  Cognitive:  Did not attend group  Insight:  Did not attend group  Engagement in Group:  Did not attend group  Additional Comments:  Pt walked in group once group ended.  Karleen Hampshire Brittini 04/20/2012, 2:18 PM

## 2012-04-20 NOTE — Discharge Planning (Signed)
Met with patient in Aftercare Planning Group and provided today's workbook based on theme of the day.  Patient has been living in his own apartment at [facility] and states he does not want to return there.  He would like Case Manager to find him a room in the country somewhere.  Until then, he wants to stay at a homeless shelter.    Patient stated he is angry with his [facility] and wants to not work with them anymore.  He did sign consent for [place] to get his medication list and to speak with the ACT Team.  Patient asks to return to [place] mental health clinic [place], stating that he was with them from 1995-2009.  He appeared to be unaware that it has changed names and staff.  Patient reported that he is hearing at least 20 voices, and that he wants to kill some of them.  Later he stated there were specific people who give him a hard time that he would like to kill.  Case Manager sent request for records to [facility] and when those records arrived, gave them to the doctor.  Also called and verified that his only psychiatric medication is Seroquel 100 mg BID.  The ACT Team Leader Vincente Poli also reported to Case Manager that from time to time patient goes through this type of period of feeling he wants to stop working with the ACT Team.  The last time he was asked why, the ACT Team Leader quoted and warned Case Manager it was graphic, that he said, "'cause I ain't getting no p*ssy, I ain't getting no *ss, and I ain't getting no h*ad."  In the past when he has wanted to move or has wanted to stop ACTT services, very quickly he has changed his mind.  We made arrangements for the ACT Team Leader to come visit patient.  It was also shared that patient had said to ACTT staff several days ago that he wanted to go to the hospital because he had run out of food.  They immediately took him to several food banks to get food, but patient is now swearing that he has cleared his entire room out.  The  other thing that they wanted [place] to know is that patient has been drinking alcohol fairly regularly.  Ambrose Mantle, LCSW 04/20/2012, 2:14 PM

## 2012-04-20 NOTE — Tx Team (Signed)
Interdisciplinary Treatment Plan Update (Adult)  Date:  04/20/2012  Time Reviewed:  10:15AM-11:15AM  Progress in Treatment: Attending groups:  Yes Participating in groups:  Yes   Taking medication as prescribed:    Yes Tolerating medication:   Yes Family/Significant other contact made:  Not yet Patient understands diagnosis:   Yes, fair insight, poor judgment Discussing patient identified problems/goals with staff:   Yes Medical problems stabilized or resolved:   Yes Denies suicidal/homicidal ideation:  Yes Issues/concerns per patient self-inventory:   Missing possessions between [facility] ED and here at [facility] Other:    New problem(s) identified: Yes, Describe:  missing possessions, states he will run away from this area if he is put on an injection again, just like he ran away last time ("too much at one time")  Reason for Continuation of Hospitalization: Delusions  Depression Hallucinations Homicidal ideation Medication stabilization  Interventions implemented related to continuation of hospitalization:  Medication monitoring and adjustment, safety checks Q15 min., suicide risk assessment, group therapy, psychoeducation, collateral contact, aftercare planning, ongoing physician assessments, medication education  Additional comments:  Very upset, states that his [place] [employer] dufflebag is missing along with his Obamaphone, clippers, 15 pr boxers, socks, and more  Estimated length of stay:  3-5 days  Discharge Plan:  Unknown at this time, he refuses to consider going back to his former apartment ([address]), wants to get rid of his ACT Team Nurse, adult), is interested in possibly going to [facility] in [place] goal(s):  Not applicable  Review of initial/current patient goals per problem list:   1.  Goal(s):  Medication stabilization  Met:  No  Target date:  By Discharge   As evidenced by:  Not stable  2.  Goal(s):  Reduce auditory hallucinations (with command)  to baseline.  Met:  No  Target date:  By Discharge   As evidenced by:  Still hearing about 20 voices  3.  Goal(s):  Decrease depression to no greater than 3 at discharge.  Met:  No  Target date:  By Discharge   As evidenced by:  High depression right now  4.  Goal(s):  Determine aftercare living situation and follow-up, whether to remain with [facility].  Met:  No  Target date:  By Discharge   As evidenced by:  Ongoing need  5.  Goal(s):  Deny HI for 48 hours prior to D/C.  Met:  No  Target date:  By Discharge   As evidenced by:  HI today  6.  Goal(s):  Return sleep pattern to normal, 6+ hours nightly.  Met:  No  Target date:  By Discharge   As evidenced by:  Tired, needs sleep right now  Attendees: Patient:  Ryan York  04/20/2012 10:15AM-11:15AM  Family:     Physician:  Dr. Nelly Rout 04/20/2012 10:15AM-11:15AM  Nursing:   Joslyn Devon, RN 04/20/2012 10:15AM -11:15AM   Case Manager:  Ambrose Mantle, LCSW 04/20/2012 10:15AM-11:15AM  Counselor:  Veto Kemps, MT-BC 04/20/2012 10:15AM-11:15AM  Other:   Neill Loft, RN 04/20/2012   Other:      Other:      Other:       Scribe for Treatment Team:   Sarina Ser, 04/20/2012, 10:15AM-11:15AM

## 2012-04-20 NOTE — H&P (Signed)
Psychiatric Admission Assessment Adult  Patient Identification:  Ryan York Date of Evaluation:  04/20/2012 Chief Complaint:  Schizophrenia; Bipolar Disorder NOS History of Present Illness::Ryan York is an 41 y.o. male transferred from [place] long ED. Patient presented to [facility] Emergency Department with the chief complaint of experiencing auditory hallucinations. Patient reported to Clinical research associate that he was diagnosed with Schizophrenia in 1995, stating "That's when I started hearing the voices". Patient stated to writer that he hears 20 different voices in his head that command him to hurt himself. "I live in a hostile environment full of druggies and prostitutes. No one ever told the truth about Schizophrenia and telepathy. The voices be trying to inflict pain and I can't take it no more." Patient reported that his only way of coping with the voices is by consuming his Seroquel because it puts him to sleep. Patient stated that the voices are "interrogating me. They wanna see me go to jail or make babies with my girl."  Patient reported that he began hearing the voices through objects such as the speaker or electronics beginning in 1999. Patient desires for the voices to stop and to have his medications evaluated. Patient denies SI/HI/VH at this time. Patient has a history of multiple psychiatric hospitalizations in the past and has been at [facility], [facility], and [facility].  Patient adds that he is missing him back and feels that someone has stolen it. He wants to make a report to the police to report his missing bag.   Mood Symptoms:  Depression, Hypomania/Mania, Mood Swings, Sleep, Depression Symptoms:  depressed mood, insomnia, psychomotor agitation, difficulty concentrating, disturbed sleep, (Hypo) Manic Symptoms:  Distractibility, Flight of Ideas, Impulsivity, Irritable Mood, Labiality of Mood, Anxiety Symptoms:  Excessive Worry, Psychotic Symptoms:   Delusions, Hallucinations: Auditory  PTSD Symptoms: Had a traumatic exposure in the last month:  none  Past Psychiatric History: Diagnosis:  Hospitalizations:  Outpatient Care:  Substance Abuse Care:  Self-Mutilation:  Suicidal Attempts:  Violent Behaviors:   Past Medical History:   Past Medical History  Diagnosis Date  . Bipolar disorder   . Schizophrenia   . Asthma    None. Allergies:  No Known Allergies PTA Medications: Prescriptions prior to admission  Medication Sig Dispense Refill  . QUEtiapine (SEROQUEL) 100 MG tablet Take 100 mg by mouth 2 (two) times daily.         Previous Psychotropic Medications:  Medication/Dose                 Substance Abuse History in the last 12 months:Patient has been drinking a lot of alcohol but denies any other drug use Substance Age of 1st Use Last Use Amount Specific Type  Nicotine      Alcohol      Cannabis      Opiates      Cocaine      Methamphetamines      LSD      Ecstasy      Benzodiazepines      Caffeine      Inhalants      Others:                         Consequences of Substance Abuse: Withdrawal Symptoms:   None  Social History: Current Place of Residence:  Lives in section 8 housing Place of Birth:   Family Members: Parents supportive Marital Status:  Single    Family History:  History reviewed. No  pertinent family history.  Mental Status Examination/Evaluation: Objective:  Appearance: Bizarre and Guarded  Eye Contact::  Fair  Speech:  Pressured and loud  Volume:  Increased  Mood:  Angry and Irritable  Affect:  Inappropriate and Labile  Thought Process:  Disorganized and Irrelevant  Orientation:  Other:  Place and person  Thought Content:  Delusions, Hallucinations: Auditory and Paranoid Ideation  Suicidal Thoughts:  No  Homicidal Thoughts:  No  Memory:  Immediate;   Poor  Judgement:  Poor  Insight:  Lacking  Psychomotor Activity:  Increased and Restlessness  Concentration:  Poor   Recall:  Poor  Akathisia:  No  Handed:  Right  AIMS (if indicated):     Assets:  Housing Physical Health Social Support  Sleep:  Number of Hours: 4.75     Laboratory/X-Ray Psychological Evaluation(s)      Assessment:    AXIS I:  Schizoaffective Disorder and Alcohol abuse/dependency AXIS II:  Deferred AXIS III:   Past Medical History  Diagnosis Date  . Bipolar disorder   . Schizophrenia   . Asthma    AXIS IV:  other psychosocial or environmental problems, problems related to social environment and problems with primary support group AXIS V:  31-40 impairment in reality testing  Treatment Plan/Recommendations:  Treatment Plan Summary: Daily contact with patient to assess and evaluate symptoms and progress in treatment Medication management Current Medications:  Current Facility-Administered Medications  Medication Dose Route Frequency Provider Last Rate Last Dose  . acetaminophen (TYLENOL) tablet 650 mg  650 mg Oral Q6H PRN Mickeal Skinner, MD      . alum & mag hydroxide-simeth (MAALOX/MYLANTA) 200-200-20 MG/5ML suspension 30 mL  30 mL Oral Q4H PRN Mickeal Skinner, MD      . benztropine (COGENTIN) tablet 1 mg  1 mg Oral BID PRN Mickeal Skinner, MD      . magnesium hydroxide (MILK OF MAGNESIA) suspension 30 mL  30 mL Oral Daily PRN Mickeal Skinner, MD      . QUEtiapine (SEROQUEL XR) 24 hr tablet 300 mg  300 mg Oral QHS Mickeal Skinner, MD   300 mg at 04/19/12 2124   Facility-Administered Medications Ordered in Other Encounters  Medication Dose Route Frequency Provider Last Rate Last Dose  . DISCONTD: alum & mag hydroxide-simeth (MAALOX/MYLANTA) 200-200-20 MG/5ML suspension 30 mL  30 mL Oral PRN Carlisle Beers Molpus, MD      . DISCONTD: nicotine (NICODERM CQ - dosed in mg/24 hours) patch 21 mg  21 mg Transdermal Daily Carlisle Beers Molpus, MD   21 mg at 04/19/12 0932  . DISCONTD: ondansetron (ZOFRAN) tablet 4 mg  4 mg Oral Q8H PRN Carlisle Beers Molpus, MD      . DISCONTD: QUEtiapine (SEROQUEL)  tablet 100 mg  100 mg Oral BID Carlisle Beers Molpus, MD   100 mg at 04/19/12 0931  . DISCONTD: ziprasidone (GEODON) capsule 20 mg  20 mg Oral BID WC Nathan R. Pickering, MD   20 mg at 04/19/12 1650    Observation Level/Precautions:   Every 15 minute checks  Laboratory:  Labs to be reviewed  Psychotherapy:  Patient to participate in groups   Medications:  To change Seroquel to Seroquel XR 300 mg one pill at bedtime   Routine PRN Medications:  Yes  Consultations:  None at this time   Discharge Concerns: The need for medication compliance      Ras Kollman 7/23/20132:32 PM

## 2012-04-20 NOTE — Progress Notes (Signed)
[facility] Group Notes:  (Counselor/Nursing/MHT/Case Management/Adjunct)  04/20/2012 1:03 PM  Type of Therapy:  Group Therapy  Participation Level:  Active  Participation Quality:  Attentive and Sharing  Affect:  Appropriate  Cognitive:  Oriented  Insight:  Limited  Engagement in Group:  Good  Engagement in Therapy:  Good  Modes of Intervention:  Clarification, Problem Solving, Support   Summary of Progress/Problems: Patient talked about needing to get away from his apartment of 8 years due to the landlord, the drug environment, etc. He wants to get off of section 8. Stated he doesn't hear voices when he is away from the apartment. Heard voices for years but nothing helps except THC. He denies any use now. Thinks he needs to be out in the country, where it is quiet and he can just walk out of his front door into daylight. He has been in group homes and doesn't like these either. Hoping to find a different place. Stated he is not in any hurry to leave the hospital.   Veto Kemps 04/20/2012, 1:03 PM

## 2012-04-20 NOTE — [facility] Counselor (Signed)
Adult Comprehensive Assessment  Patient ID: Ryan York, male   DOB: [birth year], 41 y.o.   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 get along IT trainer / Lack of resources (include bankruptcy): fixed income of SSI, food stamps and [employer] / Lack of housing: doesn't like his section 8 housing, wants to go to a shelter Physical health (include injuries & life threatening diseases): ashtma, bade left leg Social relationships: lacks social support Substance abuse: none reported Bereavement / Loss: none reported  Living/Environment/Situation:  Living Arrangements: Alone Living conditions (as described by patient or guardian): doesn't like, drug infested, landlord won't keep things up, section 8 How long has patient lived in current situation?: 8 years What is atmosphere in current home: Dangerous  Family History:  Marital status: Separated Separated, when?: separated in 29, hasn't seen since 1999. What types of issues is patient dealing with in the relationship?: n/a Does patient have children?: No  Childhood History:  By whom was/is the patient raised?: Both parents Description of patient's relationship with caregiver when they were a child: alright, he was very active and didn't have time to get in trouble Patient's description of current relationship with people who raised him/her: good, they just have to tolerate me now, working on it. Does patient have siblings?: Yes Number of Siblings: 1  (older brother) Description of patient's current relationship with siblings: good Did patient suffer any verbal/emotional/physical/sexual abuse as a child?: No Did patient suffer from severe childhood neglect?: No Has patient ever been sexually  abused/assaulted/raped as an adolescent or adult?: No Was the patient ever a victim of a crime or a disaster?: No Witnessed domestic violence?: No Has patient been effected by domestic violence as an adult?: No  Education:  Highest grade of school patient has completed: graduated high school, vocational classes on line Currently a student?: Yes Name of school: [employer] online classes How long has the patient attended?: unsure Learning disability?: No  Employment/Work Situation:   Employment situation: On disability Why is patient on disability: Schizophrenia How long has patient been on disability: unsure Patient's job has been impacted by current illness: No What is the longest time patient has a held a job?: 15 months 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 Has patient ever been in the [employer]?: No Has patient ever served in Buyer, retail?: No  Financial Resources:   Surveyor, quantity resources: [employer];Food stamps;Medicaid Does patient have a representative payee or guardian?: No  Alcohol/Substance Abuse:   What has been your use of drugs/alcohol within the last 12 months?: none reported currently, age 41-drug use If attempted suicide, did drugs/alcohol play a role in this?: No Alcohol/Substance Abuse Treatment Hx: Past Tx, Inpatient If yes, describe treatment: ADS Has alcohol/substance abuse ever caused legal problems?: Yes (age 41-possession charges, )  Social Support System:   Patient's Community Support System: Production assistant, radio System: family, [facility] Type of faith/religion: none How does patient's faith help to cope with current illness?: n/a  Leisure/Recreation:   Leisure and Hobbies: writing poetry, collect cans, watch [employer]  Strengths/Needs:   What things does the patient do well?: I talk to people and relate to people In what areas does patient struggle / problems for patient: not having a lot of energy  Discharge Plan:   Does  patient  have access to transportation?: No Plan for no access to transportation at discharge: bus Will patient be returning to same living situation after discharge?: No Plan for living situation after discharge: wants to go to a shelter Currently receiving community mental health services: Yes (From Whom) Nurse, adult, wants to go to [place]) Does patient have financial barriers related to discharge medications?: No  Summary/Recommendations:   Summary and Recommendations (to be completed by the evaluator): Patient is a 41 year old African American male with diagnosis of Schizophrenia. He was admitted with audiitory hallucinations. Patient is not compliant with medications. Patient's main goal for hospitaliztion is to find different housing. Patient will benefit from crisis stabilization, medication evaluation, group therapy and psycho-education groups to work on coping skills, cse management for referrals .  Ryan York. 04/20/2012

## 2012-04-20 NOTE — Progress Notes (Signed)
Psychoeducational Group Note  Date:  04/20/2012 Time:  2030  Group Topic/Focus:  Wrap-Up Group:   The focus of this group is to help patients review their daily goal of treatment and discuss progress on daily workbooks.  Participation Level:  Minimal  Participation Quality:  Monopolizing  Affect:  Anxious and Irritable  Cognitive:  Confused  Insight:  Limited  Engagement in Group:  Limited  Additional Comments:  Patient attended wrap-up group this evening and was respectful when the first two group members shared but when patient shared during group, patient was sharing that other members in the group were causing agitation. Patient spoke with raised voice and was not redirectable. Patient left group after sharing.  Amanpreet Delmont, Newton Pigg 04/20/2012, 8:54 PM

## 2012-04-21 DIAGNOSIS — F259 Schizoaffective disorder, unspecified: Principal | ICD-10-CM

## 2012-04-21 MED ORDER — HYDROXYZINE HCL 50 MG PO TABS: 50.0000 mg | ORAL_TABLET | Freq: Three times a day (TID) | ORAL | Status: DC | PRN

## 2012-04-21 NOTE — Discharge Planning (Signed)
Met with patient in Aftercare Planning Group and provided today's workbook based on theme of the day.  He was a little sleepy and kept snoring while other patients were talking, but was sufficiently awake to talk with Case Manager several times throughout group.  He expressed some bizarre ideas regarding being in a studio apartment, with the air conditioner and refrigerator draining or interfering with his energy.  He spoke with his ACT Team yesterday and now is consent to stay with their services, and stay at [facility] if he can get a room on the other side of the building where the traffic will not interfere with his energy, would prefer two rooms where he can get away from the air conditioner and refrigerator.  He stated he is willing to save his money toward paying off the landlord and listened openly to suggestions from Case Manager about possibly using his ACT Team to save his money.  He has now decided he does not want to lose his [address] 8 housing, and is willing to be patient in moving with the help of the ACT Team.  A [facility] Team member came to see him today again.    No case management needs today aside from utilization management.  Ambrose Mantle, LCSW 04/21/2012, 12:40 PM

## 2012-04-21 NOTE — Progress Notes (Signed)
[facility] MD Progress Note  04/21/2012 1:33 PM  Diagnosis:  Axis I: Schizo affective disorder, bipolar type  ADL's:  Impaired  Sleep: Poor  Appetite:  Fair  Suicidal Ideation: none  Homicidal Ideation:  Plan:  " I need to kill alll the drug dealers, prostitutes as they are bothering me   AEB (as evidenced by):  Mental Status Examination/Evaluation: Objective:  Appearance: Disheveled  Patent attorney::  Fair  Speech:  Pressured  Volume:  Increased  Mood:  Angry, Anxious and Irritable  Affect:  Labile  Thought Process:  Disorganized and Loose  Orientation:  Other:  Person and place  Thought Content:  Delusions and Hallucinations: Auditory Tactile  Suicidal Thoughts:  No  Homicidal Thoughts:  No  Memory:  Immediate;   Fair  Judgement:  Poor  Insight:  Absent  Psychomotor Activity:  Mannerisms and Restlessness  Concentration:  Poor  Recall:  Poor  Akathisia:  No    AIMS (if indicated):     Assets:  Desire for Improvement Physical Health  Sleep:  Number of Hours: 5.25    Vital Signs:Blood pressure 126/87, pulse 72, temperature 96.7 F (35.9 C), temperature source Oral, resp. rate 16, height 6' (1.829 m), weight 213 lb (96.616 kg). Current Medications: Current Facility-Administered Medications  Medication Dose Route Frequency Provider Last Rate Last Dose  . acetaminophen (TYLENOL) tablet 650 mg  650 mg Oral Q6H PRN Mickeal Skinner, MD      . alum & mag hydroxide-simeth (MAALOX/MYLANTA) 200-200-20 MG/5ML suspension 30 mL  30 mL Oral Q4H PRN Mickeal Skinner, MD      . benztropine (COGENTIN) tablet 1 mg  1 mg Oral BID PRN Nelly Rout, MD      . haloperidol (HALDOL) 5 MG tablet           . haloperidol (HALDOL) tablet 10 mg  10 mg Oral Q1H PRN Mickie D. Adams, PA   10 mg at 04/20/12 2115   Or  . haloperidol lactate (HALDOL) injection 10 mg  10 mg Intramuscular Q1H PRN Mickie D. Adams, PA      . hydrOXYzine (ATARAX/VISTARIL) tablet 50 mg  50 mg Oral TID PRN Nelly Rout, MD      .  LORazepam (ATIVAN) 1 MG tablet           . LORazepam (ATIVAN) 2 MG/ML injection           . LORazepam (ATIVAN) tablet 2 mg  2 mg Oral Q1H PRN Mickie D. Adams, PA   2 mg at 04/20/12 2115   Or  . LORazepam (ATIVAN) injection 2 mg  2 mg Intramuscular Q1H PRN Mickie D. Adams, PA      . magnesium hydroxide (MILK OF MAGNESIA) suspension 30 mL  30 mL Oral Daily PRN Mickeal Skinner, MD      . QUEtiapine (SEROQUEL XR) 24 hr tablet 300 mg  300 mg Oral QHS Mickeal Skinner, MD   300 mg at 04/20/12 2213  . DISCONTD: haloperidol (HALDOL) tablet 5 mg  5 mg Oral Q8H PRN Nelly Rout, MD      . DISCONTD: haloperidol lactate (HALDOL) injection 5 mg  5 mg Intramuscular Q8H PRN Nelly Rout, MD      . DISCONTD: LORazepam (ATIVAN) injection 2 mg  2 mg Intravenous Q1H PRN Mickie D. Adams, PA        Lab Results: No results found for this or any previous visit (from the past 48 hour(s)).  Physical Findings: AIMS: Facial and Oral Movements Muscles  of Facial Expression: None, normal Lips and Perioral Area: None, normal Jaw: None, normal Tongue: None, normal,Extremity Movements Upper (arms, wrists, hands, fingers): None, normal Lower (legs, knees, ankles, toes): None, normal, Trunk Movements Neck, shoulders, hips: None, normal, Overall Severity Severity of abnormal movements (highest score from questions above): None, normal Incapacitation due to abnormal movements: None, normal Patient's awareness of abnormal movements (rate only patient's report): No Awareness, Dental Status Current problems with teeth and/or dentures?: No Does patient usually wear dentures?: No  CIWA:  CIWA-Ar Total: 0  COWS:  COWS Total Score: 0   Treatment Plan Summary: Daily contact with patient to assess and evaluate symptoms and progress in treatment Medication management Patient's delusions and hallucinations the need to remit in order for him to be discharged  Plan: Continue Seroquel XL 300 mg at bedtime for psychosis To continue  Haldol by mouth/IM for agitation along with Cogentin if needed To start Vistaril 50 mg by mouth every 8 hours when necessary for anxiety/agitation Patient to participate in the program Labs reviewed today  Ryan York 04/21/2012, 1:33 PM

## 2012-04-21 NOTE — Progress Notes (Signed)
Psychoeducational Group Note  Date:  04/21/2012 Time:  1100  Group Topic/Focus:  Crisis Planning:   The purpose of this group is to help patients create a crisis plan for use upon discharge or in the future, as needed.  Participation Level:  Did Not Attend  Participation Quality:    Affect:    Cognitive:    Insight:    Engagement in Group:   Additional Comments:  Pt stated he had an prn and was feeling tired.  Isla Pence M 04/21/2012, 1:34 PM

## 2012-04-21 NOTE — Progress Notes (Signed)
[facility] Group Notes:  (Counselor/Nursing/MHT/Case Management/Adjunct)  04/21/2012 2:17 PM  Type of Therapy:  Group Therapy  Participation Level:  Did Not Attend. Patient's medications are making him drowsy.   Fleet Higham, [name] 04/21/2012, 2:17 PM

## 2012-04-21 NOTE — Progress Notes (Signed)
D.Pt. Ryan York.  Pt. Sitting in his room on his bed.  Denies SI/HI.  Denies pain.  No concerns or issues voiced at present. A.  Encouragement and support given.   R.  Pt. Remains safe.

## 2012-04-21 NOTE — Progress Notes (Signed)
D:  Pt. about on the unit today.  He did attend discharge planning group but went back to bed.  He said he was still feeling sleepy from night meds last night.  He said he got upset over some belongings last evening.  He said that he still hears some voices but said that they were not as bad as last night.  Rates his depression as an 8/10.  He denies SI/HI. A:  Offered support and encouragement.  Encouraged patient that if he has any concerns/needs that he seek out staff.  R:  Receptive to staff.  Remains on q 15 minute checks.  Will continue to monitor.

## 2012-04-21 NOTE — Progress Notes (Signed)
04/21/2012         Time: 0930      Group Topic/Focus: The focus of the group is on enhancing the patients' ability to utilize positive relaxation strategies by practicing several that can be used at discharge.  Participation Level: Did not attend  Participation Quality: Not Applicable  Affect: Not Applicable  Cognitive: Not Applicable   Additional Comments: Patient asleep.   Orest Dygert 04/21/2012 11:51 AM 

## 2012-04-21 NOTE — Progress Notes (Signed)
Psychoeducational Group Note  Date:  04/21/2012 Time:  2000  Group Topic/Focus:  Wrap-Up Group:   The focus of this group is to help patients review their daily goal of treatment and discuss progress on daily workbooks.  Participation Level:  Active  Participation Quality:  Appropriate  Affect:  Appropriate  Cognitive:  Appropriate  Insight:  Good  Engagement in Group:  Good  Additional Comments:  Pt attended wrap-up group this evening and stated that he has had a good day today.  Kaleen Odea R 04/21/2012, 9:24 PM

## 2012-04-22 MED ORDER — HALOPERIDOL 5 MG PO TABS
10.0000 mg | ORAL_TABLET | Freq: Every day | ORAL | Status: DC
  Administered 2012-04-22 – 2012-04-23 (×2): 10 mg via ORAL
  Filled 2012-04-22 (×4): qty 2

## 2012-04-22 NOTE — Progress Notes (Signed)
D: Patient denies SI/HI but is still experiencing auditory hallucinations. Patient has a preoccupied mood with an appropriate affect. Patients reports sleeping well and having a good appetite and energy level. The patient rates his depression and hopeless a 3 out of 10 (1 low/10 high). The patient is interacting on the unit more and attending groups.  A: Patient given emotional support from RN. Patient encouraged to come to staff with concerns and/or questions. Patient's medication routine continued. Patient's orders and plan of care reviewed. R: Patient remains appropriate and cooperative. Will continue to monitor patient q15 minutes for safety. Patient agreed to come to RN with any questions or concerns.

## 2012-04-22 NOTE — Progress Notes (Signed)
[facility] Group Notes:  (Counselor/Nursing/MHT/Case Management/Adjunct)  04/22/2012 11:35 AM  Type of Therapy:  Group Therapy  Participation Level:  Active  Participation Quality:  Appropriate  Affect:  Appropriate  Cognitive:  Alert and Appropriate  Insight:  Good  Engagement in Group:  Good  Engagement in Therapy:  Good  Modes of Intervention:  Problem-solving  Summary of Progress/Problems:Ryan York was able to express life changes he is interested in making to include: changing his current living situation and being open to transitioning to structured living situation such as [facility] and taking college classes at the local technical school.  Ryan York agrees to communicate and be more compliant with recommendations while work with his ACTT team.  Ryan York was able to advise a peer in group on the benefits of taking advantage of resources such as [employer], school, and not falling into situations with "club women" through speaking about life changes he would make if he were able to be 41 years old again.   Clarice Pole C 04/22/2012, 11:35 AM

## 2012-04-22 NOTE — Progress Notes (Signed)
D: Pt appears anxious and depressed. Pt stated that he is satisfied with his medication regimen. He stated that the haldol makes it easier to fight the voices off. He appears restless at times. Pt did not attend 8pm group therapy. He stated that his day so far has been up and down.  A: Support given. Verbalization encouraged. Pt encouraged to come to staff with any needs or concerns.  R: Pt is receptive. No complaints of pain or discomfort at this time. Q36min checks. Will continue to monitor.

## 2012-04-22 NOTE — Progress Notes (Signed)
[facility] Group Notes:  (Counselor/Nursing/MHT/Case Management/Adjunct)  04/22/2012 2:17 PM  Type of Therapy:  Music Therapy  Participation Level:  Active  Participation Quality:  Appropriate  Affect:  Appropriate  Cognitive:  Oriented  Insight:  Good  Engagement in Group:  Good  Engagement in Therapy:  Good  Modes of Intervention:  Activity, support, education and music  Summary of Progress/Problems: Patient was active in music therapy activities. He chose songs and reminisced about certain songs. He talked about wanting to be in a relationship where he could be comfortable and just be himself. Appropriate interactions with peers.   Vashon Arch, [name] 04/22/2012, 2:17 PM

## 2012-04-22 NOTE — Discharge Planning (Addendum)
Aftercare Planning Group  Date:  04/22/2012  Time:  8:30AM  Narrative:  Met with patient in Aftercare Planning Group and provided today's workbook based on theme of the day.  Patient stated he received a visit yesterday from his ACT Team, and that he will either go back to his apartment, or even possibly stay with the maintenance man in the apartment building while his ACT Team works to identify a new [address] location for him.  Later, patient started talking about how people have been "playing" him for 19-20 years as getting Bipolar medications even though he has Schizophrenia.  Case Manager spent some time providing psychoeducation re Schizoaffective Disorder, as many current patients do have that diagnosis.  Patient was not able to connect with this explanation, however.  Affect:  Broad  Insight:  Poor  Judgment:  Fair  Problems identified:  None  Request(s) made to Case Manager:  None  Still needed to finalize discharge plans:  Obtain appointment date/time from [facility] before discharge.  Give patient applicaition to [employer] to begin working on.  Call [facility] to inform them to follow up on [employer] application.   Ambrose Mantle, LCSW 04/22/2012 10:10 AM

## 2012-04-22 NOTE — Progress Notes (Signed)
Currently resting quietly in bed with eyes closed. Respirations are even and unlabored. No acute distress noted. Safety has been maintained with Q15 minute observation. Will continue to current POC. 

## 2012-04-22 NOTE — Progress Notes (Signed)
Patient ID: Ryan York, male   DOB: 07/14/1971, 41 y.o.   MRN: [id number] [facility] MD Progress Note  04/22/2012 12:04 PM  Diagnosis:  Axis I: Schizo affective disorder, bipolar type  ADL's:  Impaired  Sleep: Poor  Appetite:  Fair  Suicidal Ideation: none  Homicidal Ideation:  Plan:  None  AEB (as evidenced by): Patient reports his anxiety a 4/10 this morning, his auditory hallucinations a 5/10 Mental Status Examination/Evaluation: Objective:  Appearance: Disheveled  Eye Contact::  Fair  Speech:  Pressured  Volume:  Increased  Mood:  Angry, Anxious and Irritable  Affect:  Labile  Thought Process:  Disorganized and Loose  Orientation:  Other:  Person and place  Thought Content:  Delusions and Hallucinations: Auditory Tactile  Suicidal Thoughts:  No  Homicidal Thoughts:  No  Memory:  Immediate;   Fair  Judgement:  Poor  Insight:  Absent  Psychomotor Activity:  Mannerisms and Restlessness  Concentration:  Poor  Recall:  Poor  Akathisia:  No    AIMS (if indicated):     Assets:  Desire for Improvement Physical Health  Sleep:  Number of Hours: 5    Vital Signs:Blood pressure 134/92, pulse 91, temperature 97 F (36.1 C), temperature source Oral, resp. rate 16, height 6' (1.829 m), weight 213 lb (96.616 kg). Current Medications: Current Facility-Administered Medications  Medication Dose Route Frequency Provider Last Rate Last Dose  . acetaminophen (TYLENOL) tablet 650 mg  650 mg Oral Q6H PRN Mickeal Skinner, MD      . alum & mag hydroxide-simeth (MAALOX/MYLANTA) 200-200-20 MG/5ML suspension 30 mL  30 mL Oral Q4H PRN Mickeal Skinner, MD      . benztropine (COGENTIN) tablet 1 mg  1 mg Oral BID PRN Nelly Rout, MD      . haloperidol (HALDOL) tablet 10 mg  10 mg Oral Q1H PRN Mickie D. Adams, PA   10 mg at 04/20/12 2115   Or  . haloperidol lactate (HALDOL) injection 10 mg  10 mg Intramuscular Q1H PRN Mickie D. Adams, PA      . haloperidol (HALDOL) tablet 10 mg  10 mg Oral  QHS Nelly Rout, MD      . hydrOXYzine (ATARAX/VISTARIL) tablet 50 mg  50 mg Oral TID PRN Nelly Rout, MD      . LORazepam (ATIVAN) tablet 2 mg  2 mg Oral Q1H PRN Mickie D. Adams, PA   2 mg at 04/20/12 2115   Or  . LORazepam (ATIVAN) injection 2 mg  2 mg Intramuscular Q1H PRN Mickie D. Adams, PA      . magnesium hydroxide (MILK OF MAGNESIA) suspension 30 mL  30 mL Oral Daily PRN Mickeal Skinner, MD      . QUEtiapine (SEROQUEL XR) 24 hr tablet 300 mg  300 mg Oral QHS Mickeal Skinner, MD   300 mg at 04/21/12 2231    Lab Results: No results found for this or any previous visit (from the past 48 hour(s)).  Physical Findings: AIMS: Facial and Oral Movements Muscles of Facial Expression: None, normal Lips and Perioral Area: None, normal Jaw: None, normal Tongue: None, normal,Extremity Movements Upper (arms, wrists, hands, fingers): None, normal Lower (legs, knees, ankles, toes): None, normal, Trunk Movements Neck, shoulders, hips: None, normal, Overall Severity Severity of abnormal movements (highest score from questions above): None, normal Incapacitation due to abnormal movements: None, normal Patient's awareness of abnormal movements (rate only patient's report): No Awareness, Dental Status Current problems with teeth and/or dentures?: No Does patient  usually wear dentures?: No  CIWA:  CIWA-Ar Total: 0  COWS:  COWS Total Score: 0   Treatment Plan Summary: Daily contact with patient to assess and evaluate symptoms and progress in treatment Medication management Patient's delusions and hallucinations  need to remit in order for him to be discharged  Plan: Continue Seroquel XR 300 mg at bedtime for psychosis Start Haldol 10 MG by mouth each bedtime for psychosis To continue Haldol by mouth/IM for agitation along with Cogentin if needed To start Vistaril 50 mg by mouth every 8 hours when necessary for anxiety/agitation Patient to participate in the program EKG ordered for tomorrow  morning as the patient is being started on Haldol and is already on Seroquel to rule out any conduction abnormalities  Roc Streett 04/22/2012, 12:04 PM

## 2012-04-22 NOTE — Progress Notes (Signed)
Psychoeducational Group Note  Date:  04/22/2012 Time:  1100  Group Topic/Focus:  Overcoming Stress:   The focus of this group is to define stress and help patients assess their triggers.  Participation Level:  Active  Participation Quality:  Appropriate  Affect:  Appropriate  Cognitive:  Alert  Insight:  Good  Engagement in Group:  Good  Additional Comments:  Patient contributed greatly to the group subject. Pt shared a detailed list of his stressors and self developed coping skills with peers.   Koki Buxton A 04/22/2012, 11:58 PM

## 2012-04-23 NOTE — Progress Notes (Signed)
04/23/2012      Time: 0930      Group Topic/Focus: The focus of this group is on discussing various styles of communication and communicating assertively using 'I' (feeling) statements.  Participation Level: Active  Participation Quality: Redirectable  Affect: Labile  Cognitive: Alert  Additional Comments: Patient making comments about everything like it was a game show. Patient also reported that he has learned that if he "shows himself" he can get sleep medications whenever he wants. Patient reported he did so last night because he felt like staff wasn't listening to him.   Jaliah Foody 04/23/2012 10:12 AM

## 2012-04-23 NOTE — Progress Notes (Signed)
[facility] Group Notes:  (Counselor/Nursing/MHT/Case Management/Adjunct)  04/23/2012 3:40 PM  Type of Therapy:  Group Therapy  Participation Level:  Minimal  Participation Quality:  Attentive  Affect:  Blunted  Cognitive:  Oriented  Insight:  Limited  Engagement in Group:  Limited  Engagement in Therapy:  Limited  Modes of Intervention:  Education and Support  Summary of Progress/Problems: Patient came to group late. At the end of discussion on barriers, patient began to ramble and was not reality oriented. He talked about being protective of his heart and that he would hurt females.   HartisAram Beecham 04/23/2012, 3:40 PM

## 2012-04-23 NOTE — Progress Notes (Signed)
Patient ID: Ryan York, male   DOB: 06-Mar-1971, 41 y.o.   MRN: [id number] D. The patient has an anxious mood and affect, but is pleasant and cooperative. He has been obsessing about his discharge living arrangements and has asked several staff members the same questions regarding living in a group home versus an assisted living.  A. Support given and encouraged to continue working with his case manager and the treatment team to complete his discharge plan. He will probably not be discharged until next week. R. The patient is aware that he will initially return to his present home and continue working with the ACT team for further living arrangements

## 2012-04-23 NOTE — Discharge Planning (Signed)
Aftercare Planning Group   -  04/23/2012 @ 8:30AM  Narrative:  Met with patient in Aftercare Planning Group and provided today's workbook based on theme of the day.  Patient stated he did receive the information from Case Manager yesterday regarding [facility], and that he is not sure if he can apply there due to restrictions in his activities based on problems with his kneww.  Case Manager explained the process starts with a phone call to the Admissions Director, and that would let him know if he is even eligible.  Explained to him again that he will not get into [facility] from the hospital, and that his [facility] can pursue this with him.  Informed the patient that Case Manager called yesterday and spoke with the ACT Team leader, explaining his interest in [place] and how they can pursue it, giving her the web address to look it up.  Patient asked where he would stay until approved to go to [facility], and Case Manager explained he would go about his life normally.  Affect:   Somewhat broader                  Insight:   Good                  Judgment:  Fair  Problems identified:  None  Request(s) made to Case Manager:  None  Still needed to finalize discharge plans:  Obtain appointment date/time from [facility] before discharge.  Ambrose Mantle, LCSW 04/23/2012 10:02 AM

## 2012-04-23 NOTE — Progress Notes (Addendum)
Ryan York  has been seen [facility] on the [facility] hall today, tolerated fair.  Has stayed mostly to himself and in his room. He has an intense expression on his face...looks straight ahead and does not smile. He  Completed  his AM self inventory this morning, and on it he wrote he denied SI in the past 24 hrs, he rated his depression and hopelessness " 0 / 0 " and stated his DC plan was : " try to find the right meds for the daytime".      A He did attend his groups, was minimally engaged, but demonstrates difficulty in processing info given to him by counselors and case managers. He demonstrates thought blocking...gets visibly agitated when he is unable to complete his thoughts and leaves ...while trying to speak to this nurse.      R He was visibly anxious and asked for " something for my knee" this evening after dinner. Pt reports he underwent arthroscopic surgery on his left knee recently and that " it's killing me". Pt was given 2 mg ativan po, prn and 2 tylenol prn, per MD order. Pt was positioned to comfort  And able to calm down immediately after taking medicine. POC in place and safety is maintaiend as well. PD RN [facility]

## 2012-04-23 NOTE — Tx Team (Signed)
Interdisciplinary Treatment Plan Update (Adult)  Date:  04/23/2012  Time Reviewed:  10:15AM-11:15AM  Progress in Treatment: Attending groups:  Yes Participating in groups:    Some Taking medication as prescribed:    Yes Tolerating medication:   Yes Family/Significant other contact made:  Yes Patient understands diagnosis:   Yes Discussing patient identified problems/goals with staff:   Yes Medical problems stabilized or resolved:   Yes Denies suicidal/homicidal ideation:  Yes Issues/concerns per patient self-inventory:   None Other:    New problem(s) identified: No, Describe:    Reason for Continuation of Hospitalization: Hallucinations Medication stabilization Other; describe ruminations  Interventions implemented related to continuation of hospitalization:  Medication monitoring and adjustment, safety checks Q15 min., suicide risk assessment, group therapy, psychoeducation, collateral contact, aftercare planning, ongoing physician assessments, medication education  Additional comments:  Not applicable  Estimated length of stay:  3 days  Discharge Plan:  Return to live by self at [facility], follow up with [facility]  New goal(s):  Not applicable  Review of initial/current patient goals per problem list:   1.  Goal(s):  Medication stabilization  Met:  No  Target date:  By Discharge   As evidenced by:  Ongoing  2.  Goal(s):  Reduce auditory hallucinations (with command) to baseline.  Met:  No  Target date:  By Discharge   As evidenced by:  Needs more time to be ascertained to be at baseline  3.  Goal(s):  Decrease depression to no greater than 3 at discharge.  Met:  No  Target date:  By Discharge   As evidenced by:  Appears depressed  4.  Goal(s):  Determine aftercare living situation and follow-up, whether to remain with [facility].  Met:  Yes  Target date:  By Discharge   As evidenced by:  Will return to apartment, follow up with  ACT Team  5. Goal(s): Deny HI for 48 hours prior to D/C.  Met: No  Target date: By Discharge  As evidenced [id number], needs 48 hours.  6. Goal(s): Return sleep pattern to normal, 6+ hours nightly.  Met: No  Target date: By Discharge  As evidenced by: 4.75 hours last night  Attendees: Patient:  Ryan York  04/23/2012 10:15AM-11:15AM  Family:     Physician:   04/23/2012 10:15AM-11:15AM  Nursing:   Nestor Ramp, RN 04/23/2012 10:15AM -11:15AM   Case Manager:  Ambrose Mantle, LCSW 04/23/2012 10:15AM-11:15AM  Counselor:  Veto Kemps, MT-BC 04/23/2012 10:15AM-11:15AM  Other:   Verne Spurr, PA 04/23/2012   Other:      Other:      Other:       Scribe for Treatment Team:   Sarina Ser, 04/23/2012, 10:15AM-11:15AM

## 2012-04-23 NOTE — Progress Notes (Signed)
[facility] Group Notes:  (Counselor/Nursing/MHT/Case Management/Adjunct)  04/23/2012 9:34 PM  Type of Therapy:  wrap up  Participation Level:  Active  Participation Quality:  Appropriate  Affect:  Appropriate  Cognitive:  Appropriate  Insight:  Good  Engagement in Group:  Good  Engagement in Therapy:  Good  Modes of Intervention:  Education and Support  Summary of Progress/Problems: Ryan York was active in group tonight. Ryan York said that he had a good day because he got to talk to the dr about his knee. He also found out about housing options today and wants more info on them. He asked about getting a wheelchair because his knee has been hurting him.   Nichola Sizer 04/23/2012, 9:34 PM

## 2012-04-24 MED ORDER — HALOPERIDOL 2 MG PO TABS
4.0000 mg | ORAL_TABLET | Freq: Every day | ORAL | Status: DC
  Administered 2012-04-24 – 2012-04-26 (×3): 4 mg via ORAL
  Filled 2012-04-24 (×4): qty 2

## 2012-04-24 MED ORDER — HALOPERIDOL 2 MG PO TABS
2.0000 mg | ORAL_TABLET | ORAL | Status: DC
  Administered 2012-04-24 – 2012-04-27 (×10): 2 mg via ORAL
  Filled 2012-04-24 (×13): qty 1

## 2012-04-24 NOTE — Progress Notes (Signed)
Psychoeducational Group Note  Date:  04/24/2012 Time:1311  Group Topic/Focus:  Therapeutic Activity  Participation Level:  Active  Participation Quality:  Appropriate  Affect:  Appropriate  Cognitive:  Appropriate  Insight:  Good  Engagement in Group:  Good  Additional Comments: Pt attended therapeutic group with tech and peers. Pt was thrill about apple to apples games. Pt interacting with tech and peers and found the game very relaxing and helping to take his mind of stuff.  Lenox Ahr 04/24/2012, 3:12 PM

## 2012-04-24 NOTE — Progress Notes (Signed)
Psychoeducational Group Note  Date:  04/24/2012 Time:  0945 am  Group Topic/Focus:  Identifying Needs:   The focus of this group is to help patients identify their personal needs that have been historically problematic and identify healthy behaviors to address their needs.  Participation Level:  Did Not Attend   Andrena Mews 04/24/2012,10:46 AM

## 2012-04-24 NOTE — Progress Notes (Signed)
Patient ID: DELLIS VOGHT, male   DOB: 12-11-1970, 41 y.o.   MRN: [id number] D.The patient was in evening wrap up group. Initially he participated appropriately and shared that he uses walking as his method of coping. He listened and supported others as they shared their coping strategies. Then out of no where asked if God causes cancer to woman who have babies and don't tell men they fathered a child. Attempts were made to redirect him back on topic. He became agitated, verbally insulted a male patient, got up to leave group but turned to her and said to her, " Watch out I know where you live. Bang, bang!" He went to his room and was observed pacing and talking to himself. Stated, " I'm sick of African women and all their lies. I'm sick of white woman too. If you want me to drop my pants and so you can watch me jerk my dick off I will, but I'm sick of all of you woman." A. Medicated with Ativan prn for agitation and his H.S. medications. Encouraged to stay in his room until he was feeling less anxious and agitated. Reminded him that he was in a hospital and we needed to keep everyone safe.  R. Was able to follow direction and took his medication. Continued to speak to himself in an agitated manner, but made no attempts to physically harm anyone. Will continue to monitor.

## 2012-04-24 NOTE — Progress Notes (Signed)
Patient ID: Ryan York, male   DOB: 1970/10/24, 41 y.o.   MRN: [id number]  [facility] Group Notes:  (Counselor/Nursing/MHT/Case Management/Adjunct)  04/24/2012 11 AM  Type of Therapy:  Aftercare Planning, Group Therapy, Dance/Movement Therapy   Participation Level:  Active  Participation Quality:  Appropriate and Drowsy  Affect:  Appropriate  Cognitive:  Appropriate  Insight:  Limited  Engagement in Group:  Limited  Engagement in Therapy:  Limited  Modes of Intervention:  Clarification, Problem-solving, Role-play, Socialization and Support  Summary of Progress/Problems: After Care: Pt. attended and participated in aftercare planning group. Pt. accepted information on suicide prevention, warning signs to look for with suicide and crisis line numbers to use. The pt. agreed to call crisis line numbers if having warning signs or having thoughts of suicide. Pt. listed their current mood as "Feeling good like good like sunshine". Counseling:Therapist discussed the definition of self sabotage.Therapist asked group "What is your definition of self sabotage.?" and "Why do we tend to do things to sabotage ourselves?" Therapist discussed motives of sabotage and ways to turn negative thoughts into positive thoughts.  Therapist asked group to discuss self sabotaging mechanisms in their lives and how they turn the negative into positive.  Pt. stated, "Sometimes I am afraid to accept when things are going good.  I feel like something bad is going to happen.  Something positive I do is work out and exercise.        Rhunette Croft 04/24/2012. 11:50 AM

## 2012-04-24 NOTE — Progress Notes (Signed)
D Fabyan is seen  Out   in the milieu this morning.  He makes spontaneous   eye contact. He jokes with  This nurse. And .states " boy, i went to sleep last night....'. .  He interacts appropriately with the other pts.. He takes his medications as ordered..he asks " is that haldol?"  This nurse educates pt about his haldol and he is engaged in this discussion.      A he completed his self inventory  And on it he wrote he denied SI, he rated his depression and hopelessness " 3 / 3 " and stated his DC plan is to continue with " exercise and rest".      R Safety is in place and POC cont with therapeutic relationship fostered PD RN [facility]

## 2012-04-24 NOTE — Progress Notes (Addendum)
Ryan York  41 y.o.  [id number] [birth year]   Diagnosis:   Subjective: Corgan states he did not sleep well and he needed a prn medication last night to sleep. CM states that he was bragging about being able to manipulate the staff to get his medications any time he wanted them. When asked about this he states he was talking "junk" and that 4 pm is his headbanging tme.  He states he would feel better if he had medication more frequently.  Vital Signs:Blood pressure 139/72, pulse 112, temperature 97.4 F (36.3 C), temperature source Oral, resp. rate 16, height 6' (1.829 m), weight 96.616 kg (213 lb).   Objective: His speech is clear and goal directed, he is chagrinned at being questioned about his behavior but he does admit it was manipulative on his part. He states is depression is at a 4/10 today, no SI/no HI.  He reports the AH/VH are not bothering him as much but are still present.  Assessment: dx unchanged vs. Medication seeking behaviors.  Medications Scheduled:  . haloperidol  10 mg Oral QHS  . QUEtiapine  300 mg Oral QHS   PRN Meds acetaminophen, alum & mag hydroxide-simeth, benztropine, haloperidol, haloperidol lactate, hydrOXYzine, LORazepam, LORazepam, magnesium hydroxide  Plan: 1. Haldol 2mg  po TID at 8AM , 12 noon, and 4pm. 2. Haldol 4mg  at HS. 3. Seroquel 300mg  at hs. Increase to 400mg  if no improvement.  Rona Ravens. Usama H[facility] 04/24/2012 12:28 AM

## 2012-04-25 NOTE — Progress Notes (Signed)
[facility] Group Notes:  (Counselor/Nursing/MHT/Case Management/Adjunct)  04/25/2012 2:45 AM  Type of Therapy:  wrap-up  Participation Level:  Active  Participation Quality:  Appropriate  Affect:  Angry  Cognitive:  Appropriate  Insight:  Good  Engagement in Group:  Good  Engagement in Therapy:  Good  Modes of Intervention:  Education  Summary of Progress/Problems:Patient attended and participated in our group this evening. He reports that he had a good day, attended groups and socialized with peers. The patient was appropriate until the end of group he requested to ask a question. He asked what will happen to a woman who have a child and do not tell the man that they fathered a child. "will God give them some kind of disease like cancer or something". The patient became very agitated stating that he didn't expected to get answer from all women. He got up and walked out of the room. Before leaving he directed his anger to one particular patient in the room and threaten her. Patient was observed paseing and speaking to himself loud and agitated. Pt nurse spoke with him and offered him medication which he took.   Lita Mains [facility] 04/25/2012, 2:45 AM

## 2012-04-25 NOTE — Progress Notes (Signed)
Psychoeducational Group Note  Date:  04/25/2012 Time:  0945 am  Group Topic/Focus:  Making Healthy Choices:   The focus of this group is to help patients identify negative/unhealthy choices they were using prior to admission and identify positive/healthier coping strategies to replace them upon discharge.  Participation Level:  Active  Participation Quality:  Appropriate  Affect:  Blunted  Cognitive:  Alert  Insight:  Good  Engagement in Group:  Good  Additional Comments:  Pt gave supportive feedback to another pt during group  Andrena Mews 04/25/2012, 10:29 AM

## 2012-04-25 NOTE — Progress Notes (Signed)
Psychoeducational Group Note  Date:  04/25/2012 Time:  1515  Group Topic/Focus:  Goals Group:   The focus of this group is to help patients establish daily goals to achieve during treatment and discuss how the patient can incorporate goal setting into their daily lives to aide in recovery.  Participation Level:  Active  Participation Quality:  Appropriate and Attentive  Affect:  Appropriate  Cognitive:  Appropriate  Insight:  Good  Engagement in Group:  Good  Additional Comments:  Pt participated and processed to group.  Marquis Lunch, Demiana Crumbley 04/25/2012, 4:25 PM

## 2012-04-25 NOTE — Progress Notes (Signed)
Patient ID: Ryan York, male   DOB: [birth year], 41 y.o.   MRN: [id number] D. The patient was unable to attend group this evening. He was restless and pacing the hall. His speech was rapid and thoughts were loose. Vague when asked about auditory hallucinations. Instead talked about having to relocate out of [place] due to having too many painful memories here. Stated he has 30 children and none of the mothers will acknowledge him as the father. He claims he recognizes these children when he sees them and knows that they are his. Stated he has gone to houses and rang doorbells to homes where he believes a child of his might be living, but everyone lies to him. A broken spoon that had the stem twisted off was found in his room during environmental rounds. The straight stem with a pointed end was in his pocket. He stated it was to use as an imaginary cigarette because he was having cravings. A. The patient was asked to turn in the broken spoon and pockets searched for any other contraband or weapons. Offered nicotine replacement. Medicated for agitation and anxiety as well as scheduled H.S. medication. Q 15 minute checks maintained for safety.  R. The patient turned in the broken spoon handle and complied with request to empty pockets. Refused offer of nicotine replacement. Thoughts remains tangential but after medication was less anxious and not pacing the hallway.

## 2012-04-25 NOTE — Progress Notes (Signed)
D Ruffin has been seen out in the milieu..he interacts with the staff and other pts...tolerated fair. HE is pleasant and cooperative...boisterous when he approaches  This nurse to take his meds. . He attended his  AM group and completed his self inventory and on it he wrote he denied SI in the past 24 hrs, he rated his depression  And hopelessness " 2 / 2  " .      A He has attended his meals in the cafeteria, he has voiced no complaints.      R Safety is in place and POC cont with therapeutic relationship being fostered PD RN [facility]

## 2012-04-25 NOTE — Progress Notes (Signed)
Patient ID: Ryan York, male   DOB: 06-Feb-1971, 41 y.o.   MRN: [id number]  [facility] Group Notes:  (Counselor/Nursing/MHT/Case Management/Adjunct)  04/25/2012 11 AM  Type of Therapy:  Aftercare Planning, Group Therapy, Dance/Movement Therapy   Participation Level:  Active  Participation Quality:  Redirectable  Affect:  Appropriate  Cognitive:  Appropriate  Insight:  Limited  Engagement in Group:  Limited  Engagement in Therapy:  Limited  Modes of Intervention:  Clarification, Problem-solving, Role-play, Socialization and Support  Summary of Progress/Problems: After Care: Pt. attended and participated in aftercare planning group. Pt. accepted information on suicide prevention, warning signs to look for with suicide and crisis line numbers to use. The pt. agreed to call crisis line numbers if having warning signs or having thoughts of suicide. Pt. listed their mood as feeling good. Counseling:  Therapist discussed the meaning of support and how supports systems play a role in a healthy recovery..  Therapist asked what are the supports in your life and ask the group to name at least three healthy supports in their life.  Therapist processed with group in knowing the difference between unhealthy and positive supports.  Therapist processed with group ways to control the various supports in their lives.Therapist asked group what are ways to support yourself when your supports are not available when you need them.  Pt. stated "supports in my life are the hospital,mom and brother".    Rhunette Croft 04/25/2012. 3:01 PM

## 2012-04-25 NOTE — Progress Notes (Signed)
TAGEN MILBY  41 y.o.  [id number] 12-19-1970  04/24/2012   Diagnosis: Schizophrenia, Bipolar disorder  Subjective: Met with patient to discuss his symptoms.  He states he slept better, Appetite is good. He states his depression is a 2/10. Denis SI/HI, and he reports that the voices are decreasing by 80% with which he is pleased. He states his knee is sore which he feels is from playing basketball during recreation time yesterday. Vital Signs: BP 108/82 Temp: 97.5 Resp: 15 Pulse 68   Objective: His speech is clear, mood is calm, affect is congruent. Eye contact is good, and he reports his anxiety is much less today at a 2/10.   Assessment:Bipolar effective disorder improving. Pt. Is showing good response to 1st generation antipsychotic and atypical antipsychotic.  Medications Scheduled:  . haloperidol  2 mg Oral [id number]  . haloperidol  4 mg Oral QHS  . QUEtiapine  300 mg Oral QHS   PRN Meds acetaminophen, alum & mag hydroxide-simeth, benztropine, haloperidol, haloperidol lactate, hydrOXYzine, LORazepam, LORazepam, magnesium hydroxide  Plan: Continue current plan of care with no changes at this time.    Ice pack NSAID for knee. Rona Ravens. Rosbel Buckner [facility] 04/25/2012 12:05 PM

## 2012-04-25 NOTE — Progress Notes (Signed)
[facility] MD Progress Note  04/25/2012 12:00 PM  S: "I am doing great right now. I am sleeping well. The voices come off and on. I know to turn it off and stay busy, that is I keep my mind busy. No, I don't have suicidal thoughts and or suicidal thoughts".  Diagnosis:   Axis I: Schizoaffective disorder, bipolar type Axis II: Deferred Axis III:  Past Medical History  Diagnosis Date  . Bipolar disorder   . Schizophrenia   . Asthma    Axis IV: other psychosocial or environmental problems Axis V: 61-70 mild symptoms  ADL's:  Intact  Sleep: Good  Appetite:  Good  Suicidal Ideation:  Plan:  No Intent:  No Means:  no Homicidal Ideation:  Plan:  No Intent:  no Means:  No  AEB (as evidenced by): Per patient's reports.  Mental Status Examination/Evaluation: Objective:  Appearance: Casual  Eye Contact::  Good  Speech:  Clear and Coherent  Volume:  Normal  Mood:  Euthymic  Affect:  Appropriate  Thought Process:  Coherent  Orientation:  Full  Thought Content:  Rumination  Suicidal Thoughts:  No  Homicidal Thoughts:  No  Memory:  Immediate;   Good Recent;   Good Remote;   Good  Judgement:  Fair  Insight:  Fair  Psychomotor Activity:  Normal  Concentration:  Good  Recall:  Good  Akathisia:  No  Handed:  Right  AIMS (if indicated):     Assets:  Communication Skills Desire for Improvement  Sleep:  Number of Hours: 5    Vital Signs:Blood pressure 118/82, pulse 90, temperature 97.6 F (36.4 C), temperature source Oral, resp. rate 20, height 6' (1.829 m), weight 96.616 kg (213 lb). Current Medications: Current Facility-Administered Medications  Medication Dose Route Frequency Provider Last Rate Last Dose  . acetaminophen (TYLENOL) tablet 650 mg  650 mg Oral Q6H PRN Mickeal Skinner, MD   650 mg at 04/23/12 1816  . alum & mag hydroxide-simeth (MAALOX/MYLANTA) 200-200-20 MG/5ML suspension 30 mL  30 mL Oral Q4H PRN Mickeal Skinner, MD      . benztropine (COGENTIN) tablet 1 mg  1  mg Oral BID PRN Nelly Rout, MD      . haloperidol (HALDOL) tablet 10 mg  10 mg Oral Q1H PRN Mickie D. Adams, PA   10 mg at 04/20/12 2115   Or  . haloperidol lactate (HALDOL) injection 10 mg  10 mg Intramuscular Q1H PRN Mickie D. Adams, PA      . haloperidol (HALDOL) tablet 2 mg  2 mg Oral [id number] Verne Spurr, PA-C   2 mg at 04/25/12 1151  . haloperidol (HALDOL) tablet 4 mg  4 mg Oral QHS Verne Spurr, PA-C   4 mg at 04/24/12 2052  . hydrOXYzine (ATARAX/VISTARIL) tablet 50 mg  50 mg Oral TID PRN Nelly Rout, MD      . LORazepam (ATIVAN) tablet 2 mg  2 mg Oral Q1H PRN Mickie D. Adams, PA   2 mg at 04/24/12 2054   Or  . LORazepam (ATIVAN) injection 2 mg  2 mg Intramuscular Q1H PRN Mickie D. Adams, PA      . magnesium hydroxide (MILK OF MAGNESIA) suspension 30 mL  30 mL Oral Daily PRN Mickeal Skinner, MD      . QUEtiapine (SEROQUEL XR) 24 hr tablet 300 mg  300 mg Oral QHS Mickeal Skinner, MD   300 mg at 04/24/12 2051    Lab Results: No results found for this or any  previous visit (from the past 48 hour(s)).  Physical Findings: AIMS: Facial and Oral Movements Muscles of Facial Expression: None, normal Lips and Perioral Area: None, normal Jaw: None, normal Tongue: None, normal,Extremity Movements Upper (arms, wrists, hands, fingers): None, normal Lower (legs, knees, ankles, toes): None, normal, Trunk Movements Neck, shoulders, hips: None, normal, Overall Severity Severity of abnormal movements (highest score from questions above): None, normal Incapacitation due to abnormal movements: None, normal Patient's awareness of abnormal movements (rate only patient's report): No Awareness, Dental Status Current problems with teeth and/or dentures?: No Does patient usually wear dentures?: No  CIWA:  CIWA-Ar Total: 0  COWS:  COWS Total Score: 0   Treatment Plan Summary: Daily contact with patient to assess and evaluate symptoms and progress in treatment Medication management  Plan:  Continue current treatment plan.  Armandina Stammer I 04/25/2012, 12:00 PM

## 2012-04-26 NOTE — Progress Notes (Signed)
D Elvyn is seen out in the milieu this morning. He makes appropriate eye contact. He is articulate, pleasant and cooperative. HE attended his AM group as planned. He completed his AM self inventory and on it he writes he denied experiencing SI in the past 24 hrs, he rated his depression and hopelessness " 2 / 2 " and he stated his DC plan is to " exercise".       A  He is compliant with his medications and has not experienced any identifiable side effects associated with these meds.      R POC cont with safety maintained, DC planning in place and therapeutic relationship fostered. PD RN [facility]

## 2012-04-26 NOTE — Progress Notes (Signed)
04/26/2012         Time: 0930      Group Topic/Focus: The focus of this group is on discussing various aspects of wellness, balancing those aspects and exploring ways to increase the ability to experience wellness.  Participation Level: Active  Participation Quality: Attentive  Affect: Appropriate  Cognitive: Alert   Additional Comments: None.  Alayjah Boehringer 04/26/2012 12:34 PM

## 2012-04-26 NOTE — Progress Notes (Signed)
Patient ID: Ryan York, male   DOB: 1971-06-20, 41 y.o.   MRN: [id number] D: Pt. Sitting in room with light off, but speaks to Clinical research associate, notes voices have decreased with meds. " I don't even talk to them like that" Pt. Reports making a game plan for discharge tomorrow, "one that makes sense" Denies SHI. A: Staff will monitor for safety q75min. Pt. Encouraged to come to group. R: Pt. Remains safe on the unit. Pt. Attended group.

## 2012-04-26 NOTE — Treatment Plan (Signed)
Interdisciplinary Treatment Plan Update (Adult) Date: 04/26/2012  Time Reviewed: 10:55 AM  Progress in Treatment: Attending groups: Yes Participating in groups: Yes Taking medication as prescribed: Yes Tolerating medication: Yes Family/Significant othe contact made:  Patient understands diagnosis: Yes Discussing patient identified problems/goals with staff: Yes Medical problems stabilized or resolved: Yes Denies suicidal/homicidal ideation: Yes Issues/concerns per patient self-inventory: None identified Other: N/A New problem(s) identified: None Identified Reason for Continuation of Hospitalization: Depression Hallucinations Medication stabilization Interventions implemented related to continuation of hospitalization: mood stabilization, medication monitoring and adjustment, group therapy and psycho education, safety checks q 15 mins Additional comments: N/A Estimated length of stay:  Discharge Plan: SW is assessing for appropriate referrals.  Reason for Continuation of Hospitalization:  Hallucinations  Medication stabilization  Other; describe ruminations  Interventions implemented related to continuation of hospitalization: Medication monitoring and adjustment, safety checks Q15 min., suicide risk assessment, group therapy, psychoeducation, collateral contact, aftercare planning, ongoing physician assessments, medication education  Additional comments: Not applicable  Estimated length of stay: 3 days  Discharge Plan: Return to live by self at [facility], follow up with [facility]  New goal(s): Not applicable  Review of initial/current patient goals per problem list:  1. Goal(s): Medication stabilization  Met: No  Target date: By Discharge  As evidenced by: Ongoing 2. Goal(s): Reduce auditory hallucinations (with command) to baseline.  Met: No  Target date: By Discharge  As evidenced by: Needs more time to be ascertained to be at baseline 3. Goal(s): Decrease  depression to no greater than 3 at discharge.  Met: Yes  Target date: By Discharge  As evidenced by: Appears depressed 4. Goal(s): Determine aftercare living situation and follow-up, whether to remain with [facility].  Met: Yes  Target date: By Discharge  As evidenced by: Will return to apartment, follow up with [facility] 5. Goal(s): Deny HI for 48 hours prior to D/C.  Met: No  Target date: By Discharge  As evidenced XB:JYNWGN, needs 48 hours.  6. Goal(s): Return sleep pattern to normal, 6+ hours nightly.  Met: Yes  Target date: By Discharge  As evidenced by: improved sleep based on unit report of observation of pt.  Attendees: Patient: Ryan York  04/26/2012  Family:    Physician: Franchot Gallo, MD 04/26/2012 10:55 AM   Nursing: Alexia Freestone, RN   Case Manager: Clarice Pole, LCASA 04/26/2012 10:55 AM   Counselor: Veto Kemps, MT-BC 04/26/2012 10:55 AM   Other:    Other:    Other:    Other:    Scribe for Treatment Team:  Clarice Pole, LCASA 04/26/2012 10:55 AM

## 2012-04-26 NOTE — Progress Notes (Signed)
[facility] Group Notes:  (Counselor/Nursing/MHT/Case Management/Adjunct)  04/26/2012 2:28 PM  Type of Therapy:  Group Therapy  Participation Level:  Active  Participation Quality:  Resistant, monopolizing   Affect:  Angry  Cognitive:  Oriented  Insight:  None  Engagement in Group:  Limited  Engagement in Therapy:  Limited  Modes of Intervention:  Clarification, Limit-setting and Problem-solving  Summary of Progress/Problems: Patient was very negative in group talking about the Hispanics taking over and taking his job. Talked about how a black male can't make it. Patient was pointing out all problems but was not open to problem-solving. At some point, patient was standing up in the middle of the room and was very loud. He would not respond to redirection. Eventually the group had to be stopped because he was leading others in his negativity.   Guiseppe Flanagan, [name] 04/26/2012, 2:28 PM

## 2012-04-26 NOTE — Progress Notes (Signed)
[facility] MD Progress Note  04/26/2012 5:23 PM  Current Mental Status Per Physician:  Diagnosis:  Axis I: Schizoaffective Disorder - Bipolar Type.   The patient was seen today and reports the following:   ADL's: Intact.  Sleep: The patient reports that he is sleeping well at night..  Appetite: The patient reports a good appetite this morning.   Mild>(1-10) >Severe  Hopelessness (1-10): 0  Depression (1-10): 0  Anxiety (1-10): 0   Suicidal Ideation: The patient adamantly denies any suicidal ideations today.  Plan: No  Intent: No  Means: No   Homicidal Ideation: The patient adamantly denies any homicidal ideations today.  Plan: No  Intent: No.  Means: No   General Appearance/Behavior: The patient was friendly and cooperative today with this provider.  Eye Contact: Good.  Speech: Appropriate in rate and volume with no pressuring noted today.  Motor Behavior: wnl.  Level of Consciousness: Alert and Oriented x 3.  Mental Status: Alert and Oriented x 3.  Mood: Essentially euthymic.  Affect: Appears bright and full.  Anxiety Level: No anxiety reported today.  Thought Process: wnl.  Thought Content: The patient denies any auditory or visual hallucinations today as well as any delusional thinking.   Perception: wnl.  Judgment: Good.  Insight: Good.  Cognition: Oriented to person, place and time.  Sleep:  Number of Hours: 4.75    Vital Signs:Blood pressure 125/87, pulse 80, temperature 97.9 F (36.6 C), temperature source Oral, resp. rate 18, height 6' (1.829 m), weight 96.616 kg (213 lb).  Current Medications: Current Facility-Administered Medications  Medication Dose Route Frequency Provider Last Rate Last Dose  . acetaminophen (TYLENOL) tablet 650 mg  650 mg Oral Q6H PRN Mickeal Skinner, MD   650 mg at 04/25/12 1625  . alum & mag hydroxide-simeth (MAALOX/MYLANTA) 200-200-20 MG/5ML suspension 30 mL  30 mL Oral Q4H PRN Mickeal Skinner, MD      . benztropine (COGENTIN) tablet 1 mg   1 mg Oral BID PRN Nelly Rout, MD      . haloperidol (HALDOL) tablet 10 mg  10 mg Oral Q1H PRN Mickie D. Adams, PA   10 mg at 04/20/12 2115   Or  . haloperidol lactate (HALDOL) injection 10 mg  10 mg Intramuscular Q1H PRN Mickie D. Adams, PA      . haloperidol (HALDOL) tablet 2 mg  2 mg Oral [id number] Verne Spurr, PA-C   2 mg at 04/26/12 1701  . haloperidol (HALDOL) tablet 4 mg  4 mg Oral QHS Verne Spurr, PA-C   4 mg at 04/25/12 2106  . hydrOXYzine (ATARAX/VISTARIL) tablet 50 mg  50 mg Oral TID PRN Nelly Rout, MD      . LORazepam (ATIVAN) tablet 2 mg  2 mg Oral Q1H PRN Mickie D. Adams, PA   2 mg at 04/25/12 2107   Or  . LORazepam (ATIVAN) injection 2 mg  2 mg Intramuscular Q1H PRN Mickie D. Adams, PA      . magnesium hydroxide (MILK OF MAGNESIA) suspension 30 mL  30 mL Oral Daily PRN Mickeal Skinner, MD      . QUEtiapine (SEROQUEL XR) 24 hr tablet 300 mg  300 mg Oral QHS Mickeal Skinner, MD   300 mg at 04/25/12 2106   Lab Results: No results found for this or any previous visit (from the past 48 hour(s)).  Physical Findings: AIMS: Facial and Oral Movements Muscles of Facial Expression: None, normal Lips and Perioral Area: None, normal Jaw: None, normal Tongue: None,  normal,Extremity Movements Upper (arms, wrists, hands, fingers): None, normal Lower (legs, knees, ankles, toes): None, normal, Trunk Movements Neck, shoulders, hips: None, normal, Overall Severity Severity of abnormal movements (highest score from questions above): None, normal Incapacitation due to abnormal movements: None, normal Patient's awareness of abnormal movements (rate only patient's report): No Awareness, Dental Status Current problems with teeth and/or dentures?: No Does patient usually wear dentures?: No  CIWA:  CIWA-Ar Total: 0  COWS:  COWS Total Score: 0   Review of Systems:  Neurological: The patient denies any headaches today. He denies any seizures or dizziness.  G.I.: The patient denies any  constipation or G.I. Upset today.  Musculoskeletal: The patient denies any musculoskeletal issues today.   The patient was seen today and is Alert and Oriented x 3. The patient reports that he is sleeping well and reports a good appetite. He denies any significant feelings of sadness, anhedonia or depressed mood as well as any anxiety symptoms. He adamantly denies any suicidal or homicidal ideations and also denies any auditory or visual hallucinations or delusional thinking.  The patient denies any medication related side effects and states he has an ACT team which sees him 3 times a week.  Treatment Plan Summary:  1. Daily contact with patient to assess and evaluate symptoms and progress in treatment.  2. Medication management  3. The patient will deny suicidal ideations or homicidal ideations for 48 hours prior to discharge and have a depression and anxiety rating of 3 or less. The patient will also deny any auditory or visual hallucinations or delusional thinking.  4. The patient will deny any symptoms of substance withdrawal at time of discharge.   Plan:  1. Will continue the patient on his current medications as listed above.  2. Will continue to monitor.  3. Laboratory studies reviewed.  4. Possible discharge this week.  Ryan York 04/26/2012, 5:23 PM

## 2012-04-26 NOTE — Progress Notes (Signed)
[facility] Group Notes:  (Counselor/Nursing/MHT/Case Management/Adjunct)  04/26/2012 9:54 PM  Type of Therapy:  Wrap-up  Participation Level:  Active  Participation Quality:  Appropriate  Affect:  Appropriate  Cognitive:  Appropriate  Insight:  Good  Engagement in Group:  Good  Engagement in Therapy:  Good  Modes of Intervention:  Education  Summary of Progress/Problems: Patient stated he had a good day, states that he feels like his meds are working for him. Patient is hoping to go home tomorrow.   [facility] 04/26/2012, 9:54 PM

## 2012-04-26 NOTE — Progress Notes (Signed)
Psychoeducational Group Note  Date:  04/26/2012 Time:  1100  Group Topic/Focus:  Self Care:   The focus of this group is to help patients understand the importance of self-care in order to improve or restore emotional, physical, spiritual, interpersonal, and financial health.  Participation Level:  Active  Participation Quality:  Appropriate, Sharing and Supportive  Affect:  Appropriate  Cognitive:  Appropriate  Insight:  Good  Engagement in Group:  Good  Additional Comments:  none  Winner Valeriano M 04/26/2012, 1:28 PM

## 2012-04-26 NOTE — Discharge Planning (Signed)
04/26/2012  SW met with Ryan York in discharge planning group.  SW found [employer] to state he has adequate transportation and housing upon discharge.  SW found [employer] to need no contacts made on their behalf for follow-up.  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  SW found [name] to rate his depression at 1/10 and anxiety at 2/10 today.  Medication monitoring to continue due to history of non-compliance.  SW will continue to assess for referrals.  Clarice Pole, LCASA 04/26/2012, 4:08 PM

## 2012-04-27 MED ORDER — HALOPERIDOL 2 MG PO TABS: ORAL_TABLET | ORAL | Status: DC

## 2012-04-27 MED ORDER — QUETIAPINE FUMARATE ER 300 MG PO TB24: 300.0000 mg | ORAL_TABLET | Freq: Every day | ORAL | Status: DC

## 2012-04-27 NOTE — Progress Notes (Signed)
Pt. discharged from the unit.  His father here to pick him up.  He denies SI/HI and A/V hallucinations.  RN reviewed follow up appointment with him as well as medications and discharge prescriptions with voiced understanding given.  He obtained all belongings from his room and locker prior to leaving.

## 2012-04-27 NOTE — Progress Notes (Signed)
[facility] Group Notes:  (Counselor/Nursing/MHT/Case Management/Adjunct)  04/27/2012 2:12 PM  Type of Therapy:  Group Therapy  Participation Level:  Active  Participation Quality:  Attentive and Sharing  Affect:  Appropriate  Cognitive:  Oriented  Insight:  Limited  Engagement in Group:  Good  Engagement in Therapy:  Good  Modes of Intervention:  Education, Problem-solving and Support  Summary of Progress/Problems: Patient apologized for his behavior yesterday in group. He was more positive today even though he still had a negative outlook on what he can accomplish. He stated lets face it I can never have some of the things others have, I'm just not cut out for it. He was able to identify positive coping and plans to follow up with [facility]. He stated that he tends to set lists and does better with deadlines.   [name], [name] 04/27/2012, 2:12 PM

## 2012-04-27 NOTE — Progress Notes (Signed)
Psychoeducational Group Note  Date:  04/27/2012 Time: 1100  Group Topic/Focus:  Recovery Goals:   The focus of this group is to identify appropriate goals for recovery and establish a plan to achieve them.  Participation Level:  Active  Participation Quality:  Appropriate  Affect:  Appropriate  Cognitive:  Appropriate  Insight:  Limited  Engagement in Group:  Good  Additional Comments:  Pt was able to attend group this morning and actively participated.  Mahin Guardia E 04/27/2012, 1:53 PM

## 2012-04-27 NOTE — Tx Team (Signed)
Interdisciplinary Treatment Plan Update (Adult) Date: 04/27/2012  Time Reviewed: 10:16 AM  Progress in Treatment: Attending groups: Yes Participating in groups: Yes Taking medication as prescribed: Yes Tolerating medication: Yes Family/Significant othe contact made:  Patient understands diagnosis: Yes Discussing patient identified problems/goals with staff: Yes Medical problems stabilized or resolved: Yes Denies suicidal/homicidal ideation: Yes Issues/concerns per patient self-inventory: None identified Other: N/A New problem(s) identified: None Identified Reason for Continuation of Hospitalization: Other; describe Patient discharging home today Interventions implemented related to continuation of hospitalization: mood stabilization, medication monitoring and adjustment, group therapy and psycho education, safety checks q 15 mins Additional comments: N/A Estimated length of stay:  Discharge Plan: SW is assessing for appropriate referrals.  New goal(s): N/A Review of initial/current patient goals per problem list:  1. Goal(s): Medication stabilization  Met: Yes  Target date: By Discharge  As evidenced by: Stabilized per observation 2. Goal(s): Reduce auditory hallucinations (with command) to baseline.  Met: Yes  Target date: By Discharge  As evidenced by: Pt reports no AH within past 48 hours 3. Goal(s): Decrease depression to no greater than 3 at discharge.  Met: Yes Target date: By Discharge  As evidenced by: Appears depressed 4. Goal(s): Determine aftercare living situation and follow-up, whether to remain with [facility].  Met: Yes  Target date: By Discharge  As evidenced by: Will return to apartment, follow up with [facility] 5. Goal(s): Deny HI for 48 hours prior to D/C.  Met: Yes Target date: By Discharge  As evidenced [id number], needs 48 hours.  6. Goal(s): Return sleep pattern to normal, 6+ hours nightly.  Met: Yes  Target date: By Discharge  As  evidenced by: increased hours of sleep last night  Attendees: Patient: Ryan York 04/27/2012  Family:    Physician: Franchot Gallo, MD 04/27/2012 10:16 AM   Nursing:    Case Manager: Clarice Pole, LCASA 04/27/2012 10:16 AM   Counselor: Veto Kemps, MT-BC 04/27/2012 10:16 AM   Other: Joslyn Devon, RN 04/27/2012 10:16 AM   Other:    Other:    Other:    Scribe for Treatment Team:  Clarice Pole, LCASA 04/27/2012 10:16 AM

## 2012-04-27 NOTE — Discharge Planning (Signed)
04/27/2012  Narrative: SW met with Ryan York in discharge planning group.  SW found Ryan York to have adequate transportation upon discharge.  SW found Ryan York to need no contacts made on their behalf.  SW found Ryan York has current service providers, and they are [employer] [facility] team.  SW found Ryan York to rate his depression at 0/10 and anxiety at 0/10 today.  Pt. Expressed feeling better, no AVH, stable on meds, and ready to go home.     Affect: Good Insight: Good Judgment: Good   Problems identified: none   Clarice Pole, LCASA 04/27/2012, 6:25 PM

## 2012-04-27 NOTE — [facility] Suicide Risk Assessment (Signed)
Suicide Risk Assessment  Discharge Assessment     Demographic factors:  Male;Low socioeconomic status;Living alone;Unemployed  Current Mental Status Per Nursing Assessment::   On Admission:    At Discharge: The patient was seen today and is Alert and Oriented x 3. The patient reports that he is sleeping well and reports a good appetite. He denies any significant feelings of sadness, anhedonia or depressed mood.  He does report some mild anxiety symptoms related to being excited about going home.  He adamantly denies any suicidal or homicidal ideations and also denies any auditory or visual hallucinations or delusional thinking.  The patient states that he is ready to go home today and this will be ordered.  Current Mental Status Per Physician:  Diagnosis:  Axis I: Schizoaffective Disorder - Bipolar Type.   The patient was seen today and reports the following:   ADL's: Intact.  Sleep: The patient reports that he is sleeping well at night..  Appetite: The patient reports a good appetite this morning.   Mild>(1-10) >Severe  Hopelessness (1-10): 0  Depression (1-10): 0  Anxiety (1-10): 2   Suicidal Ideation: The patient adamantly denies any suicidal ideations today.  Plan: No  Intent: No  Means: No   Homicidal Ideation: The patient adamantly denies any homicidal ideations today.  Plan: No  Intent: No.  Means: No   General Appearance/Behavior: The patient was friendly and cooperative today with this provider.  Eye Contact: Good.  Speech: Appropriate in rate and volume with no pressuring noted today.  Motor Behavior: wnl.  Level of Consciousness: Alert and Oriented x 3.  Mental Status: Alert and Oriented x 3.  Mood: Essentially Euthymic.  Affect: Appears bright and full.  Anxiety Level: Mild anxiety reported today which the patient describes as excitement about going home.  Thought Process: wnl.  Thought Content: The patient denies any auditory or visual hallucinations today  as well as any delusional thinking.  Perception: wnl.  Judgment: Good.  Insight: Good.  Cognition: Oriented to person, place and time.   Loss Factors: Financial problems / change in socioeconomic status  Historical Factors: Long history of mental illness.  History of non-compliance with medications.  Risk Reduction Factors:   Good access to healthcare.  Patient has a ACT Team involved in his care.  Continued Clinical Symptoms:  Previous Psychiatric Diagnoses and Treatments Schizoaffective Disorder - Bipolar Type.  Discharge Diagnoses:   AXIS I:   Schizoaffective Disorder - Bipolar Type.  AXIS II:   Deferred. AXIS III:   1.  Asthma. AXIS IV:   Chronic Mental Illness.  Non-compliance with Medications. AXIS V:   GAF at time of admission approximately 35.  GAF at time of discharge approximately 55.  Cognitive Features That Contribute To Risk:  None Noted.    Current Medications:     . haloperidol  2 mg Oral [id number]  . haloperidol  4 mg Oral QHS  . QUEtiapine  300 mg Oral QHS   Review of Systems:  Neurological: The patient denies any headaches today. He denies any seizures or dizziness.  G.I.: The patient denies any constipation or G.I. Upset today.  Musculoskeletal: The patient denies any musculoskeletal issues today.   Treatment Plan Summary:  1. Daily contact with patient to assess and evaluate symptoms and progress in treatment.  2. Medication management  3. The patient will deny suicidal ideations or homicidal ideations for 48 hours prior to discharge and have a depression and anxiety rating of 3 or less. The  patient will also deny any auditory or visual hallucinations or delusional thinking.  4. The patient will deny any symptoms of substance withdrawal at time of discharge.   The patient was seen today and is Alert and Oriented x 3. The patient reports that he is sleeping well and reports a good appetite. He denies any significant feelings of sadness, anhedonia  or depressed mood.  He does report some mild anxiety symptoms related to being excited about going home.  He adamantly denies any suicidal or homicidal ideations and also denies any auditory or visual hallucinations or delusional thinking.  The patient states that he is ready to go home today and this will be ordered.  Plan:  1. Will continue the patient on his current medications as listed above.  2. Will continue to monitor.  3. Laboratory studies reviewed.  4. Discharge today to outpatient follow up.   Suicide Risk:  Minimal: No identifiable suicidal ideation. Patients presenting with no risk factors but with morbid ruminations; may be classified as minimal risk based on the severity of the depressive symptoms   Plan Of Care/Follow-up recommendations:  Activity: As tolerated.  Diet: Regular Diet.  Other: Please take all medications only as directed and keep all scheduled follow up appointments.  Sache Sane 04/27/2012, 1:09 PM

## 2012-04-29 NOTE — Progress Notes (Signed)
Patient Discharge Instructions:  After Visit Summary (AVS):   Faxed to:  [phone] Psychiatric Admission Assessment Note:   Faxed to:  [phone] Suicide Risk Assessment - Discharge Assessment:   Faxed to:  [phone] Faxed/Sent to the [facility] Care provider:  [phone]  Faxed to [facility] @ [phone]  Wandra Scot, 04/29/2012, 4:05 PM

## 2012-05-03 NOTE — Discharge Summary (Signed)
Physician Discharge Summary Note  Patient:  Ryan York is an 41 y.o., male MRN:  [id number] DOB:  1970-11-30 Patient phone:  [phone] (home)  Patient address:   [address] [place] [address]   Date of Admission:  04/19/2012 Date of Discharge: 04/27/2012  Discharge Diagnoses: Active Problems:  Schizoaffective disorder, bipolar type  Demographic factors:  Male;Low socioeconomic status;Living alone;Unemployed   Current Mental Status Per Nursing Assessment::  On Admission:  At Discharge: The patient was seen today and is Alert and Oriented x 3. The patient reports that he is sleeping well and reports a good appetite. He denies any significant feelings of sadness, anhedonia or depressed mood. He does report some mild anxiety symptoms related to being excited about going home. He adamantly denies any suicidal or homicidal ideations and also denies any auditory or visual hallucinations or delusional thinking. The patient states that he is ready to go home today and this will be ordered.   Current Mental Status Per Physician:   Diagnosis:  Axis I: Schizoaffective Disorder - Bipolar Type.  The patient was seen today and reports the following:   ADL's: Intact.  Sleep: The patient reports that he is sleeping well at night..  Appetite: The patient reports a good appetite this morning.   Mild>(1-10) >Severe  Hopelessness (1-10): 0  Depression (1-10): 0  Anxiety (1-10): 2   Suicidal Ideation: The patient adamantly denies any suicidal ideations today.  Plan: No  Intent: No  Means: No   Homicidal Ideation: The patient adamantly denies any homicidal ideations today.  Plan: No  Intent: No.  Means: No   General Appearance/Behavior: The patient was friendly and cooperative today with this provider.  Eye Contact: Good.  Speech: Appropriate in rate and volume with no pressuring noted today.  Motor Behavior: wnl.  Level of Consciousness: Alert and Oriented  x 3.  Mental Status: Alert and Oriented x 3.  Mood: Essentially Euthymic.  Affect: Appears bright and full.  Anxiety Level: Mild anxiety reported today which the patient describes as excitement about going home.  Thought Process: wnl.  Thought Content: The patient denies any auditory or visual hallucinations today as well as any delusional thinking.  Perception: wnl.  Judgment: Good.  Insight: Good.  Cognition: Oriented to person, place and time.   Loss Factors:  Financial problems / change in socioeconomic status   Historical Factors:  Long history of mental illness. History of non-compliance with medications.   Risk Reduction Factors:  Good access to healthcare. Patient has a ACT Team involved in his care.   Continued Clinical Symptoms:  Previous Psychiatric Diagnoses and Treatments  Schizoaffective Disorder - Bipolar Type.   Discharge Diagnoses:  AXIS I: Schizoaffective Disorder - Bipolar Type.  AXIS II: Deferred.  AXIS III: 1. Asthma.  AXIS IV: Chronic Mental Illness. Non-compliance with Medications.  AXIS V: GAF at time of admission approximately 35. GAF at time of discharge approximately 55.   Cognitive Features That Contribute To Risk:  None Noted.   Current Medications:  .  haloperidol  2 mg  Oral  [id number]   .  haloperidol  4 mg  Oral  QHS   .  QUEtiapine  300 mg  Oral  QHS    Review of Systems:  Neurological: The patient denies any headaches today. He denies any seizures or dizziness.  G.I.: The patient denies any constipation or G.I. Upset today.  Musculoskeletal: The patient denies any musculoskeletal issues today.   Treatment  Plan Summary:  1. Daily contact with patient to assess and evaluate symptoms and progress in treatment.  2. Medication management  3. The patient will deny suicidal ideations or homicidal ideations for 48 hours prior to discharge and have a depression and anxiety rating of 3 or less. The patient will also deny any auditory or  visual hallucinations or delusional thinking.  4. The patient will deny any symptoms of substance withdrawal at time of discharge.  The patient was seen today and is Alert and Oriented x 3. The patient reports that he is sleeping well and reports a good appetite. He denies any significant feelings of sadness, anhedonia or depressed mood. He does report some mild anxiety symptoms related to being excited about going home. He adamantly denies any suicidal or homicidal ideations and also denies any auditory or visual hallucinations or delusional thinking. The patient states that he is ready to go home today and this will be ordered.   Plan:  1. Will continue the patient on his current medications as listed above.  2. Will continue to monitor.  3. Laboratory studies reviewed.  4. Discharge today to outpatient follow up.   Level of Care:  OP  Hospital Course:   Ryan York is an 41 y.o. male transferred from [place] long ED. Patient presented to [facility] [place] Emergency Department with the chief complaint of experiencing auditory hallucinations. Patient reported to Clinical research associate that he was diagnosed with Schizophrenia in 1995, stating "That's when I started hearing the voices". Patient stated to writer that he hears 20 different voices in his head that command him to hurt himself. "I live in a hostile environment full of druggies and prostitutes. No one ever told the truth about Schizophrenia and telepathy. The voices be trying to inflict pain and I can't take it no more." Patient reported that his only way of coping with the voices is by consuming his Seroquel because it puts him to sleep. Patient stated that the voices are "interrogating me. They wanna see me go to jail or make babies with my girl."   Patient reported that he began hearing the voices through objects such as the speaker or electronics beginning in 1999. Patient desires for the voices to stop and to have his medications evaluated. Patient  denies SI/HI/VH at this time. Patient has a history of multiple psychiatric hospitalizations in the past and has been at [facility], [facility], and [facility].  Patient adds that he is missing him back and feels that someone has stolen it. He wants to make a report to the police to report his missing bag.  While at the hospital, the patient received medication management for her Schizoaffective Disorder - Bipolar Type. They were ordered and received Haldol and Seroquel  for her psychiatric symptoms. They were also enrolled in group counseling sessions and activities in which they participated actively.   Patient attended treatment team meeting this am and met with treatment team members. Pt symptoms, treatment plan and response to treatment discussed. The patient endorsed that their symptoms have improved. Pt also stated that they are stable for discharge.  They reported that from this hospital stay they had learned many coping skills.  In other to maintain stability, they will continue psychiatric care on outpatient basis. They will follow-up as outlined below.  In addition they were instructed to take all your medications as prescribed by your mental healthcare provider, to report any adverse effects and or reactions from your medicines to your outpatient provider  promptly, patient is instructed and cautioned to not engage in alcohol and or illegal drug use while on prescription medicines, in the event of worsening symptoms, patient is instructed to call the crisis hotline, 911 and or go to the nearest ED for appropriate evaluation and treatment of symptoms.   Follow-up Information    Follow up with [facility] on 04/28/2012. (Pt. scheduled to meet with Latoya on the ACTT team att 2pm/)    Contact information:   [facility]  [address] [place], [place] [address] [phone]        Upon discharge, patient adamantly denies suicidal, homicidal ideations, auditory, visual hallucinations and or delusional  thinking. They left [facility] with all personal belongings via personal transportation in no apparent distress.  Consults:  Please refer to the electronic medical record for more details.  Significant Diagnostic Studies:   Please refer to the laboratories sections of the electronic medical record for more details.  Discharge Vitals:   Blood pressure 116/84, pulse 100, temperature 96.7 F (35.9 C), temperature source Oral, resp. rate 20, height 6' (1.829 m), weight 96.616 kg (213 lb)..  Mental Status Exam: See Mental Status Examination and Suicide Risk Assessment completed by Attending Physician prior to discharge.  Discharge destination:  Home  Is patient on multiple antipsychotic therapies at discharge:  Yes  Has Patient had three or more failed trials of antipsychotic monotherapy by history: Yes Recommended Plan for Multiple Antipsychotic Therapies: Continue as prescribed.  The patient's symptoms are stable.  Discharge Orders    Future Orders Please Complete By Expires   Diet - low sodium heart healthy      Increase activity slowly      Discharge instructions      Comments:   Please take all medications only as directed and keep all scheduled follow up appointments.     Medication List  As of 05/03/2012  6:55 AM   STOP taking these medications         QUEtiapine 100 MG tablet         TAKE these medications      Indication    haloperidol 2 MG tablet   Commonly known as: HALDOL   Please take 1 tablet at 8 am, 12 noon, 4 pm and 2 tablets at bedtime for psychosis.       QUEtiapine 300 MG 24 hr tablet   Commonly known as: SEROQUEL XR   Take 1 tablet (300 mg total) by mouth at bedtime. For psychosis and mood stabilization.            Follow-up Information    Follow up with [facility] on 04/28/2012. (Pt. scheduled to meet with Latoya on the ACTT team att 2pm/)    Contact information:   [facility]  [address] [place], [place] [address] [phone]         Follow-up recommendations:   Activities: Resume typical activities Diet: Resume typical diet Other: Follow up with outpatient provider and report any side effects to out patient prescriber.  Comments:  Take all your medications as prescribed by your mental healthcare provider. Report any adverse effects and or reactions from your medicines to your outpatient provider promptly. Patient is instructed and cautioned to not engage in alcohol and or illegal drug use while on prescription medicines. In the event of worsening symptoms, patient is instructed to call the crisis hotline, 911 and or go to the nearest ED for appropriate evaluation and treatment of symptoms.  Suicide Risk:  Minimal: No identifiable suicidal ideation. Patients presenting with no risk factors but with morbid ruminations; may be classified as minimal risk based on the severity of the depressive symptoms   Plan Of Care/Follow-up recommendations:  Activity: As tolerated.  Diet: Regular Diet.  Other: Please take all medications only as directed and keep all scheduled follow up appointments.  Signed: Franchot Gallo 05/03/2012 6:55 AM

## 2012-06-03 ENCOUNTER — Encounter (HOSPITAL_COMMUNITY): Payer: Self-pay | Admitting: Emergency Medicine

## 2012-06-03 ENCOUNTER — Emergency Department (HOSPITAL_COMMUNITY)
Admission: EM | Admit: 2012-06-03 | Discharge: 2012-06-03 | Disposition: A | Discharge status: Home / Self Care | Payer: Medicaid Other | Attending: Emergency Medicine | Admitting: Emergency Medicine

## 2012-06-03 ENCOUNTER — Emergency Department (HOSPITAL_COMMUNITY)
Admission: EM | Admit: 2012-06-03 | Discharge: 2012-06-04 | Disposition: A | Discharge status: Home / Self Care | Payer: Medicaid Other | Source: Home / Self Care | Attending: Emergency Medicine | Admitting: Emergency Medicine

## 2012-06-03 ENCOUNTER — Encounter (HOSPITAL_COMMUNITY): Payer: Self-pay | Admitting: *Deleted

## 2012-06-03 DIAGNOSIS — F319 Bipolar disorder, unspecified: Secondary | ICD-10-CM | POA: Insufficient documentation

## 2012-06-03 DIAGNOSIS — F259 Schizoaffective disorder, unspecified: Secondary | ICD-10-CM

## 2012-06-03 DIAGNOSIS — F209 Schizophrenia, unspecified: Secondary | ICD-10-CM | POA: Insufficient documentation

## 2012-06-03 DIAGNOSIS — Z760 Encounter for issue of repeat prescription: Secondary | ICD-10-CM | POA: Insufficient documentation

## 2012-06-03 DIAGNOSIS — J45909 Unspecified asthma, uncomplicated: Secondary | ICD-10-CM | POA: Insufficient documentation

## 2012-06-03 DIAGNOSIS — F17200 Nicotine dependence, unspecified, uncomplicated: Secondary | ICD-10-CM | POA: Insufficient documentation

## 2012-06-03 DIAGNOSIS — Z79899 Other long term (current) drug therapy: Secondary | ICD-10-CM | POA: Insufficient documentation

## 2012-06-03 LAB — URINALYSIS, ROUTINE W REFLEX MICROSCOPIC
Bilirubin Urine: NEGATIVE
Hgb urine dipstick: NEGATIVE
Nitrite: NEGATIVE
Specific Gravity, Urine: 1.028 (ref 1.005–1.030)
Urobilinogen, UA: 0.2 mg/dL (ref 0.0–1.0)
pH: 6.5 (ref 5.0–8.0)

## 2012-06-03 LAB — COMPREHENSIVE METABOLIC PANEL
ALT: 17 U/L (ref 0–53)
ALT: 16 U/L (ref 0–53)
AST: 20 U/L (ref 0–37)
AST: 18 U/L (ref 0–37)
Albumin: 3.9 g/dL (ref 3.5–5.2)
Alkaline Phosphatase: 77 U/L (ref 39–117)
Alkaline Phosphatase: 74 U/L (ref 39–117)
CO2: 30 mEq/L (ref 19–32)
Calcium: 8.7 mg/dL (ref 8.4–10.5)
Chloride: 101 mEq/L (ref 96–112)
GFR calc Af Amer: 88 mL/min — ABNORMAL LOW (ref >90)
GFR calc non Af Amer: 76 mL/min — ABNORMAL LOW (ref >90)
Glucose, Bld: 100 mg/dL — ABNORMAL HIGH (ref 70–99)
Potassium: 4.3 mEq/L (ref 3.5–5.1)
Potassium: 3.7 mEq/L (ref 3.5–5.1)
Sodium: 138 mEq/L (ref 135–145)
Sodium: 136 mEq/L (ref 135–145)
Total Bilirubin: 0.3 mg/dL (ref 0.3–1.2)
Total Protein: 7.4 g/dL (ref 6.0–8.3)
Total Protein: 7.3 g/dL (ref 6.0–8.3)

## 2012-06-03 LAB — CBC WITH DIFFERENTIAL/PLATELET
Basophils Absolute: 0 10*3/uL (ref 0.0–0.1)
Lymphocytes Relative: 41 % (ref 12–46)
Lymphs Abs: 1.7 10*3/uL (ref 0.7–4.0)
Neutro Abs: 1.8 10*3/uL (ref 1.7–7.7)
Neutrophils Relative %: 43 % (ref 43–77)
Platelets: 178 10*3/uL (ref 150–400)
RBC: 4.4 MIL/uL (ref 4.22–5.81)
RDW: 11.9 % (ref 11.5–15.5)
WBC: 4.3 10*3/uL (ref 4.0–10.5)

## 2012-06-03 LAB — RAPID URINE DRUG SCREEN, HOSP PERFORMED
Amphetamines: NOT DETECTED
Barbiturates: NOT DETECTED
Barbiturates: NOT DETECTED
Benzodiazepines: NOT DETECTED
Benzodiazepines: NOT DETECTED
Cocaine: NOT DETECTED
Tetrahydrocannabinol: NOT DETECTED
Tetrahydrocannabinol: NOT DETECTED

## 2012-06-03 LAB — ACETAMINOPHEN LEVEL: Acetaminophen (Tylenol), Serum: <15 ug/mL (ref 10–30)

## 2012-06-03 LAB — CBC
HCT: 40.3 % (ref 39.0–52.0)
MCHC: 35.2 g/dL (ref 30.0–36.0)
RDW: 11.9 % (ref 11.5–15.5)
WBC: 5.8 10*3/uL (ref 4.0–10.5)

## 2012-06-03 MED ORDER — ALUM & MAG HYDROXIDE-SIMETH 200-200-20 MG/5ML PO SUSP: 30.0000 mL | ORAL | Status: DC | PRN

## 2012-06-03 MED ORDER — NICOTINE 21 MG/24HR TD PT24: 21.0000 mg | MEDICATED_PATCH | Freq: Every day | TRANSDERMAL | Status: DC

## 2012-06-03 MED ORDER — ONDANSETRON HCL 4 MG PO TABS: 4.0000 mg | ORAL_TABLET | Freq: Three times a day (TID) | ORAL | Status: DC | PRN

## 2012-06-03 MED ORDER — QUETIAPINE FUMARATE ER 300 MG PO TB24: 300.0000 mg | ORAL_TABLET | Freq: Every day | ORAL | Status: DC

## 2012-06-03 MED ORDER — ZOLPIDEM TARTRATE 5 MG PO TABS: 5.0000 mg | ORAL_TABLET | Freq: Every evening | ORAL | Status: DC | PRN

## 2012-06-03 MED ORDER — HALOPERIDOL 10 MG PO TABS: 10.0000 mg | ORAL_TABLET | Freq: Every day | ORAL | Status: DC

## 2012-06-03 MED ORDER — IBUPROFEN 600 MG PO TABS: 600.0000 mg | ORAL_TABLET | Freq: Three times a day (TID) | ORAL | Status: DC | PRN

## 2012-06-03 MED ORDER — KCL IN DEXTROSE-NACL 20-5-0.45 MEQ/L-%-% IV SOLN
INTRAVENOUS | Status: AC
  Filled 2012-06-03: qty 1000

## 2012-06-03 MED ORDER — ZIPRASIDONE MESYLATE 20 MG IM SOLR
20.0000 mg | Freq: Once | INTRAMUSCULAR | Status: AC
  Administered 2012-06-03: 20 mg via INTRAMUSCULAR

## 2012-06-03 MED ORDER — ZIPRASIDONE MESYLATE 20 MG IM SOLR
INTRAMUSCULAR | Status: AC
  Administered 2012-06-03: 20 mg via INTRAMUSCULAR
  Filled 2012-06-03: qty 20

## 2012-06-03 MED ORDER — LORAZEPAM 1 MG PO TABS
1.0000 mg | ORAL_TABLET | Freq: Three times a day (TID) | ORAL | Status: DC | PRN
  Administered 2012-06-03: 1 mg via ORAL
  Filled 2012-06-03: qty 1

## 2012-06-03 NOTE — ED Notes (Signed)
Pt states he feels bad; states needs to go back to [place].  States is hearing voices and feels paranoid like everyone is watching him;  Denies SI/HI;  States is homeless and his family won't have anything to do with him any more

## 2012-06-03 NOTE — ED Provider Notes (Signed)
History     CSN: [id number]  Arrival date & time 06/03/12  1944   First MD Initiated Contact with Patient 06/03/12 2011      Chief Complaint  Patient presents with  . Medical Clearance    (Consider location/radiation/quality/duration/timing/severity/associated sxs/prior treatment) HPI Comments: 41 year old male presents to the ED because he is hearing voices and seeing people around him. States he needs to go to [place], [place] where his father is in order to live. Patient is a level V caveat. He then states he doesn't know why he is in the emergency department. States he is perfectly fine.  The history is provided by the patient. The history is limited by the condition of the patient.    Past Medical History  Diagnosis Date  . Bipolar disorder   . Schizophrenia   . Asthma     Past Surgical History  Procedure Date  . Knee surgery     No family history on file.  History  Substance Use Topics  . Smoking status: Current Some Day Smoker -- 0.5 packs/day for 10 years    Types: Cigarettes  . Smokeless tobacco: Never Used  . Alcohol Use: No     Pt stated that he rarely consumes alcohol      Review of Systems  Unable to perform ROS   Allergies  Review of patient's allergies indicates no known allergies.  Home Medications   Current Outpatient Rx  Name Route Sig Dispense Refill  . HALOPERIDOL 10 MG PO TABS Oral Take 1 tablet (10 mg total) by mouth at bedtime. 10 tablet 0  . QUETIAPINE FUMARATE ER 300 MG PO TB24 Oral Take 1 tablet (300 mg total) by mouth at bedtime. 10 tablet 0    BP 146/93  Pulse 86  Temp 98.4 F (36.9 C)  Resp 20  SpO2 96%  Physical Exam  Nursing note and vitals reviewed. Constitutional: He appears well-developed and well-nourished.  HENT:  Head: Normocephalic and atraumatic.  Mouth/Throat: Oropharynx is clear and moist.  Eyes: EOM are normal. Right conjunctiva is injected. Left conjunctiva is injected.  Neck: Normal range of  motion.  Cardiovascular: Normal rate, regular rhythm and normal heart sounds.   Pulmonary/Chest: Effort normal and breath sounds normal.  Abdominal: Soft. Normal appearance and bowel sounds are normal. There is no tenderness.  Musculoskeletal: Normal range of motion.  Neurological: He is alert.  Psychiatric: His affect is angry and inappropriate. His speech is tangential. He is agitated, aggressive, is hyperactive and actively hallucinating. Thought content is paranoid. Cognition and memory are impaired. He expresses impulsivity and inappropriate judgment.    ED Course  Procedures (including critical care time)   Labs Reviewed  CBC  COMPREHENSIVE METABOLIC PANEL  ETHANOL  URINE RAPID DRUG SCREEN (HOSP PERFORMED)  ACETAMINOPHEN LEVEL  URINALYSIS, ROUTINE W REFLEX MICROSCOPIC   No results found.   No diagnosis found.    MDM  41 year old schizophrenic male actively hallucinating with very inappropriate behavior. Security was watching the patient. ACT team consulted.        Trevor Mace, PA-C 06/04/12 [phone]

## 2012-06-03 NOTE — ED Notes (Addendum)
Has been taking Haldol-- for past 5-6 months, does not like the way it makes him feel, wants to start Abilify with Seraquel. States does not want to hurt self or others-- but "was on the edge" last night per patient. Here willingly, lives at homeless shelter currently. Currently a patient with [facility]-- follows meds, social worker, supposed to see Med Nurse next Monday. Is currently out of medicine-- took last dose of Haldol (10mg ) last night  Recently d/c'd from [place]-- last week-- been at homeless shelter for 3 days

## 2012-06-03 NOTE — ED Notes (Signed)
Patient has belongings ln locker 4 in triage.

## 2012-06-03 NOTE — ED Provider Notes (Signed)
History     CSN: [id number]  Arrival date & time 06/03/12  4098   First MD Initiated Contact with Patient 06/03/12 [phone]      Chief Complaint  Patient presents with  . Medical Clearance    (Consider location/radiation/quality/duration/timing/severity/associated sxs/prior treatment) HPI  Patient has history schizophrenia. He was just discharged from the inner he states 8 days ago. He reports he wants to be taken off Haldol and put on Abilify. He states he was staying with his parents however he's been in the home or shelter the past 3 days. He is waiting to get into a apartment at [facility]. He states he gets his [facility] care from [facility]. He was seen there yesterday however he does not to see the nurse to get his medications refilled until 4 days from today. Patient states he was only given a seven-day supply when he was discharged from the psychiatric hospital and he is down to his last pills. Patient states "I'm not feeling good" he states he's having temper tantrums. He states "my thinking is a rational". Patient is not clear why he is here except he wants to be admitted to behavioral health so he can be taken off the Haldol and placed on the Abilify. He states he does not want to wait until he is seen at [facility] in 4 days.  PCP Dr. Concepcion Elk   Past Medical History  Diagnosis Date  . Bipolar disorder   . Schizophrenia   . Asthma     Past Surgical History  Procedure Date  . Knee surgery     No family history on file.  History  Substance Use Topics  . Smoking status: Current Some Day Smoker -- 0.5 packs/day for 10 years    Types: Cigarettes  . Smokeless tobacco: Never Used  . Alcohol Use: No     Pt stated that he rarely consumes alcohol   homeless disability  Review of Systems  All other systems reviewed and are negative.    Allergies  Review of patient's allergies indicates no known allergies.  Home Medications   Current Outpatient Rx    Name Route Sig Dispense Refill  . HALOPERIDOL 10 MG PO TABS Oral Take 10 mg by mouth at bedtime.    [name] QUETIAPINE FUMARATE ER 300 MG PO TB24 Oral Take 300 mg by mouth at bedtime.    visteril  BP 116/67  Pulse 81  Temp 97.5 F (36.4 C) (Oral)  Resp 16  SpO2 97%  Vital signs normal    Physical Exam  Nursing note and vitals reviewed. Constitutional: He is oriented to person, place, and time. He appears well-developed and well-nourished.  Non-toxic appearance. He does not appear ill. No distress.  HENT:  Head: Normocephalic and atraumatic.  Right Ear: External ear normal.  Left Ear: External ear normal.  Nose: Nose normal. No mucosal edema or rhinorrhea.  Mouth/Throat: Oropharynx is clear and moist and mucous membranes are normal. No dental abscesses or uvula swelling.  Eyes: Conjunctivae and EOM are normal. Pupils are equal, round, and reactive to light.  Neck: Normal range of motion and full passive range of motion without pain. Neck supple.  Cardiovascular: Normal rate, regular rhythm and normal heart sounds.  Exam reveals no gallop and no friction rub.   No murmur heard. Pulmonary/Chest: Effort normal and breath sounds normal. No respiratory distress. He has no wheezes. He has no rhonchi. He has no rales. He exhibits no tenderness and no crepitus.  Abdominal: Soft. Normal appearance and bowel sounds are normal. He exhibits no distension. There is no tenderness. There is no rebound and no guarding.  Musculoskeletal: Normal range of motion. He exhibits no edema and no tenderness.       Moves all extremities well.   Neurological: He is alert and oriented to person, place, and time. He has normal strength. No cranial nerve deficit.  Skin: Skin is warm, dry and intact. No rash noted. No erythema. No pallor.  Psychiatric: He has a normal mood and affect. His speech is normal and behavior is normal. His mood appears not anxious.       Patient seems to be in good spirits. He makes good  eye contact. He does not appear to have hallucinations during my exam.    ED Course  Procedures (including critical care time)  Review of his prior chart showed he was admitted to behavioral health on July 22 and was discharged 05/03/2012. I do not see that he was on Vistaril. At that time he was on Haldol and Seroquel which he is still on now. I am not going to change his medications. Patient states he feels like he can go home. He is felt to be psychiatrically stable to be discharged and followup in 4 days when he has an appointment to discuss his medications. He is running out of his medications because he was only given a seven-day supply when he was discharged from [facility]. He was given a 10 day supply of both his Seroquel and his Haldol.    Results for orders placed during the hospital encounter of 06/03/12  CBC WITH DIFFERENTIAL      Component Value Range   WBC 4.3  4.0 - 10.5 K/uL   RBC 4.40  4.22 - 5.81 MIL/uL   Hemoglobin 14.3  13.0 - 17.0 g/dL   HCT 03.4  74.2 - 59.5 %   MCV 94.3  78.0 - 100.0 fL   MCH 32.5  26.0 - 34.0 pg   MCHC 34.5  30.0 - 36.0 g/dL   RDW 63.8  75.6 - 43.3 %   Platelets 178  150 - 400 K/uL   Neutrophils Relative 43  43 - 77 %   Neutro Abs 1.8  1.7 - 7.7 K/uL   Lymphocytes Relative 41  12 - 46 %   Lymphs Abs 1.7  0.7 - 4.0 K/uL   Monocytes Relative 8  3 - 12 %   Monocytes Absolute 0.3  0.1 - 1.0 K/uL   Eosinophils Relative 8 (*) 0 - 5 %   Eosinophils Absolute 0.4  0.0 - 0.7 K/uL   Basophils Relative 1  0 - 1 %   Basophils Absolute 0.0  0.0 - 0.1 K/uL  COMPREHENSIVE METABOLIC PANEL      Component Value Range   Sodium 138  135 - 145 mEq/L   Potassium 4.3  3.5 - 5.1 mEq/L   Chloride 104  96 - 112 mEq/L   CO2 30  19 - 32 mEq/L   Glucose, Bld 100 (*) 70 - 99 mg/dL   BUN 15  6 - 23 mg/dL   Creatinine, Ser 2.95  0.50 - 1.35 mg/dL   Calcium 8.7  8.4 - 18.8 mg/dL   Total Protein 7.4  6.0 - 8.3 g/dL   Albumin 3.7  3.5 - 5.2 g/dL   AST 20  0 - 37  U/L   ALT 17  0 - 53 U/L   Alkaline Phosphatase  77  39 - 117 U/L   Total Bilirubin 0.3  0.3 - 1.2 mg/dL   GFR calc non Af Amer 76 (*) >90 mL/min   GFR calc Af Amer 88 (*) >90 mL/min  ETHANOL      Component Value Range   Alcohol, Ethyl (B) <11  0 - 11 mg/dL  URINE RAPID DRUG SCREEN (HOSP PERFORMED)      Component Value Range   Opiates NONE DETECTED  NONE DETECTED   Cocaine NONE DETECTED  NONE DETECTED   Benzodiazepines NONE DETECTED  NONE DETECTED   Amphetamines NONE DETECTED  NONE DETECTED   Tetrahydrocannabinol NONE DETECTED  NONE DETECTED   Barbiturates NONE DETECTED  NONE DETECTED   Laboratory interpretation all normal except     1. Medication refill   2. Schizophrenia       Discharge medications patient given 10 days of his Haldol and Seroquel  Plan discharge  Devoria Albe, MD, FACEP     MDM          Ward Givens, MD 06/03/12 [phone]

## 2012-06-04 MED ORDER — HALOPERIDOL 5 MG PO TABS: 10.0000 mg | ORAL_TABLET | Freq: Every day | ORAL | Status: DC

## 2012-06-04 MED ORDER — QUETIAPINE FUMARATE ER 300 MG PO TB24
300.0000 mg | ORAL_TABLET | Freq: Every day | ORAL | Status: DC
  Filled 2012-06-04: qty 1

## 2012-06-04 NOTE — ED Provider Notes (Signed)
Medical screening examination/treatment/procedure(s) were performed by non-physician practitioner and as supervising physician I was immediately available for consultation/collaboration.   Charles B. Bernette Mayers, MD 06/04/12 1610

## 2012-06-04 NOTE — ED Provider Notes (Signed)
Pt was seen by me yesterday, he wants to be taken off his haldol and put on abilify. He came back last night and said he was having hallucinations. He states he feels fine today, he cannot give me one example of a hallucination. States he just needed to lay down and rest and he feels ready to leave. He states he doesn't need to be in the hospital.  Pt will be discharged. He has an appointment in 3 days with his psychiatric team.    Devoria Albe, MD, Franz Dell, MD 06/04/12 [phone]

## 2012-10-01 ENCOUNTER — Emergency Department (HOSPITAL_COMMUNITY)
Admission: EM | Admit: 2012-10-01 | Discharge: 2012-10-01 | Disposition: A | Discharge status: Home / Self Care | Payer: Medicaid Other | Attending: Emergency Medicine | Admitting: Emergency Medicine

## 2012-10-01 ENCOUNTER — Encounter (HOSPITAL_COMMUNITY): Payer: Self-pay | Admitting: Emergency Medicine

## 2012-10-01 ENCOUNTER — Emergency Department (HOSPITAL_COMMUNITY): Payer: Medicaid Other

## 2012-10-01 DIAGNOSIS — F319 Bipolar disorder, unspecified: Secondary | ICD-10-CM | POA: Insufficient documentation

## 2012-10-01 DIAGNOSIS — Z8659 Personal history of other mental and behavioral disorders: Secondary | ICD-10-CM | POA: Insufficient documentation

## 2012-10-01 DIAGNOSIS — S4350XA Sprain of unspecified acromioclavicular joint, initial encounter: Secondary | ICD-10-CM | POA: Insufficient documentation

## 2012-10-01 DIAGNOSIS — S4980XA Other specified injuries of shoulder and upper arm, unspecified arm, initial encounter: Secondary | ICD-10-CM | POA: Insufficient documentation

## 2012-10-01 DIAGNOSIS — F17200 Nicotine dependence, unspecified, uncomplicated: Secondary | ICD-10-CM | POA: Insufficient documentation

## 2012-10-01 DIAGNOSIS — S4990XA Unspecified injury of shoulder and upper arm, unspecified arm, initial encounter: Secondary | ICD-10-CM

## 2012-10-01 DIAGNOSIS — Y929 Unspecified place or not applicable: Secondary | ICD-10-CM | POA: Insufficient documentation

## 2012-10-01 DIAGNOSIS — J45909 Unspecified asthma, uncomplicated: Secondary | ICD-10-CM | POA: Insufficient documentation

## 2012-10-01 DIAGNOSIS — S46909A Unspecified injury of unspecified muscle, fascia and tendon at shoulder and upper arm level, unspecified arm, initial encounter: Secondary | ICD-10-CM | POA: Insufficient documentation

## 2012-10-01 DIAGNOSIS — Y9355 Activity, bike riding: Secondary | ICD-10-CM | POA: Insufficient documentation

## 2012-10-01 DIAGNOSIS — S4992XA Unspecified injury of left shoulder and upper arm, initial encounter: Secondary | ICD-10-CM

## 2012-10-01 HISTORY — DX: Essential (primary) hypertension: I10

## 2012-10-01 MED ORDER — HYDROCODONE-ACETAMINOPHEN 5-325 MG PO TABS: 2.0000 | ORAL_TABLET | Freq: Four times a day (QID) | ORAL | Status: DC | PRN

## 2012-10-01 MED ORDER — OXYCODONE-ACETAMINOPHEN 5-325 MG PO TABS
1.0000 | ORAL_TABLET | Freq: Once | ORAL | Status: AC
  Administered 2012-10-01: 1 via ORAL
  Filled 2012-10-01: qty 1

## 2012-10-01 NOTE — ED Notes (Signed)
Patient transported to X-ray 
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
## 2012-10-01 NOTE — ED Provider Notes (Signed)
Medical screening examination/treatment/procedure(s) were performed by non-physician practitioner and as supervising physician I was immediately available for consultation/collaboration.  Meko Masterson, MD 10/01/12 1036 
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
## 2013-02-12 ENCOUNTER — Encounter (HOSPITAL_COMMUNITY): Payer: Self-pay | Admitting: Adult Health

## 2013-02-12 ENCOUNTER — Emergency Department (HOSPITAL_COMMUNITY)
Admission: EM | Admit: 2013-02-12 | Discharge: 2013-02-12 | Disposition: A | Discharge status: Home / Self Care | Payer: Medicaid Other | Attending: Emergency Medicine | Admitting: Emergency Medicine

## 2013-02-12 DIAGNOSIS — I10 Essential (primary) hypertension: Secondary | ICD-10-CM | POA: Insufficient documentation

## 2013-02-12 DIAGNOSIS — M25569 Pain in unspecified knee: Secondary | ICD-10-CM | POA: Insufficient documentation

## 2013-02-12 DIAGNOSIS — F209 Schizophrenia, unspecified: Secondary | ICD-10-CM | POA: Insufficient documentation

## 2013-02-12 DIAGNOSIS — F17200 Nicotine dependence, unspecified, uncomplicated: Secondary | ICD-10-CM | POA: Insufficient documentation

## 2013-02-12 DIAGNOSIS — M25562 Pain in left knee: Secondary | ICD-10-CM

## 2013-02-12 DIAGNOSIS — J45909 Unspecified asthma, uncomplicated: Secondary | ICD-10-CM | POA: Insufficient documentation

## 2013-02-12 DIAGNOSIS — Z79899 Other long term (current) drug therapy: Secondary | ICD-10-CM | POA: Insufficient documentation

## 2013-02-12 DIAGNOSIS — F319 Bipolar disorder, unspecified: Secondary | ICD-10-CM | POA: Insufficient documentation

## 2013-02-12 MED ORDER — OXYCODONE-ACETAMINOPHEN 5-325 MG PO TABS
1.0000 | ORAL_TABLET | Freq: Once | ORAL | Status: AC
  Administered 2013-02-12: 1 via ORAL
  Filled 2013-02-12: qty 1

## 2013-02-12 NOTE — ED Provider Notes (Signed)
History    This chart was scribed for non-physician practitioner Junious Silk PA-C working with Glynn Octave, MD by Smitty Pluck, ED scribe. This patient was seen in room TR06C/TR06C and the patient's care was started at 9:17 PM.   CSN: [id number]  Arrival date & time 02/12/13  2023      Chief Complaint  Patient presents with  . Knee Pain     The history is provided by the patient and medical records. No language interpreter was used.   HPI Comments: Ryan York is a 42 y.o. male with hx of schizophrenia, bipolar disorder and HTN who presents to the Emergency Department complaining of chronic intermittent, moderate, right knee pain that has been ongoing since September 1991 but worsening 5 years ago. He mentions that he has numbness in right knee. Pt states he had orthoscopic surgery on right knee in September 1991 for ligamental tear. He reports that he has been seen in the past for same symptoms and given Vicodin without relief. Pt denies fever, recent fall, recent trauma to right knee, chills, nausea, vomiting, diarrhea, weakness, cough, SOB and any other pain. He reports that he is a patient at [facility] and is going to see physicians there within the next week.     Past Medical History  Diagnosis Date  . Bipolar disorder   . Schizophrenia   . Asthma   . Hypertension     Past Surgical History  Procedure Laterality Date  . Knee surgery      History reviewed. No pertinent family history.  History  Substance Use Topics  . Smoking status: Current Some Day Smoker -- 0.50 packs/day for 10 years    Types: Cigarettes  . Smokeless tobacco: Never Used  . Alcohol Use: No     Comment: Pt stated that he rarely consumes alcohol      Review of Systems  Constitutional: Negative for fever and chills.  Respiratory: Negative for shortness of breath.   Gastrointestinal: Negative for nausea and vomiting.  Musculoskeletal: Positive for arthralgias.  Neurological:  Negative for weakness.    Allergies  Vistaril  Home Medications   Current Outpatient Rx  Name  Route  Sig  Dispense  Refill  . diclofenac sodium (VOLTAREN) 1 % GEL   Topical   Apply 2 g topically 2 (two) times daily as needed (for shoulder pain).         . fluticasone (FLONASE) 50 MCG/ACT nasal spray   Nasal   Place 2 sprays into the nose daily as needed for rhinitis or allergies.          [name] QUEtiapine (SEROQUEL XR) 300 MG 24 hr tablet   Oral   Take 300 mg by mouth at bedtime.           BP 134/91  Pulse 90  Temp(Src) 97.9 F (36.6 C) (Oral)  Resp 18  Physical Exam  Nursing note and vitals reviewed. Constitutional: He is oriented to person, place, and time. He appears well-developed and well-nourished. No distress.  HENT:  Head: Normocephalic and atraumatic.  Right Ear: External ear normal.  Left Ear: External ear normal.  Nose: Nose normal.  Eyes: Conjunctivae are normal.  Neck: Normal range of motion. No tracheal deviation present.  Cardiovascular: Normal rate, regular rhythm and normal heart sounds.   Pulmonary/Chest: Effort normal and breath sounds normal. No stridor.  Abdominal: Soft. He exhibits no distension. There is no tenderness.  Musculoskeletal: Normal range of motion.  Left knee: He exhibits normal range of motion, no swelling, no effusion, no ecchymosis, no deformity, no laceration, no erythema, normal alignment, no LCL laxity, normal patellar mobility and no MCL laxity. Tenderness found. Lateral joint line tenderness noted.  Neurological: He is alert and oriented to person, place, and time.  Skin: Skin is warm and dry. He is not diaphoretic.  Psychiatric: He has a normal mood and affect. His behavior is normal.    ED Course  Procedures (including critical care time)  COORDINATION OF CARE: 9:24 PM Discussed ED treatment with pt and pt agrees.  9:27 PM Ordered:  Medications  oxyCODONE-acetaminophen (PERCOCET/ROXICET) 5-325 MG per tablet  1 tablet (not administered)       Labs Reviewed - No data to display No results found.   1. Knee pain, left       MDM  Patient presents with knee pain since 1991. No new injuries. Neurovascularly intact. Patient went on long rant about how no one in this town will give him pain medication because they are racist. He was given 1 percocet in the ED. He was ambulatory and pacing through his ED stay without an antalgic gait. He states he will follow up with his PCP on Monday. Vital signs stable for discharge. Patient / Family / Caregiver informed of clinical course, understand medical decision-making process, and agree with plan.       I personally performed the services described in this documentation, which was scribed in my presence. The recorded information has been reviewed and is accurate.     Mora Bellman, PA-C 02/13/13 [phone]

## 2013-02-12 NOTE — ED Notes (Addendum)
Presents with chronic left knee pain since 1991. Today knee began to hurt worse. CMS intact.

## 2013-02-13 NOTE — ED Provider Notes (Signed)
Medical screening examination/treatment/procedure(s) were performed by non-physician practitioner and as supervising physician I was immediately available for consultation/collaboration.   Arlisa Leclere, MD 02/13/13 0158 

## 2014-01-14 ENCOUNTER — Emergency Department (HOSPITAL_COMMUNITY)
Admission: EM | Admit: 2014-01-14 | Discharge: 2014-01-14 | Disposition: A | Discharge status: Home / Self Care | Payer: Medicaid Other | Attending: Emergency Medicine | Admitting: Emergency Medicine

## 2014-01-14 ENCOUNTER — Emergency Department (HOSPITAL_COMMUNITY): Payer: Medicaid Other

## 2014-01-14 ENCOUNTER — Encounter (HOSPITAL_COMMUNITY): Payer: Self-pay | Admitting: Emergency Medicine

## 2014-01-14 DIAGNOSIS — F17200 Nicotine dependence, unspecified, uncomplicated: Secondary | ICD-10-CM | POA: Insufficient documentation

## 2014-01-14 DIAGNOSIS — F319 Bipolar disorder, unspecified: Secondary | ICD-10-CM | POA: Insufficient documentation

## 2014-01-14 DIAGNOSIS — I10 Essential (primary) hypertension: Secondary | ICD-10-CM | POA: Insufficient documentation

## 2014-01-14 DIAGNOSIS — L03039 Cellulitis of unspecified toe: Secondary | ICD-10-CM | POA: Insufficient documentation

## 2014-01-14 DIAGNOSIS — IMO0002 Reserved for concepts with insufficient information to code with codable children: Secondary | ICD-10-CM

## 2014-01-14 DIAGNOSIS — J45909 Unspecified asthma, uncomplicated: Secondary | ICD-10-CM | POA: Insufficient documentation

## 2014-01-14 DIAGNOSIS — F209 Schizophrenia, unspecified: Secondary | ICD-10-CM | POA: Insufficient documentation

## 2014-01-14 MED ORDER — CEPHALEXIN 500 MG PO CAPS: 500.0000 mg | ORAL_CAPSULE | Freq: Four times a day (QID) | ORAL | Status: DC

## 2014-01-14 MED ORDER — SULFAMETHOXAZOLE-TRIMETHOPRIM 800-160 MG PO TABS: 1.0000 | ORAL_TABLET | Freq: Two times a day (BID) | ORAL | Status: AC

## 2014-01-14 NOTE — ED Notes (Signed)
Patient was sound asleep and had to be awakened for assessment and went back to sleep after I woke him up.He states he does not know wether he injured it or not. He also denies any treatment at home. He states he used some "numbing ointment" during the week. The finger is swollen at the joint..[name]

## 2014-01-14 NOTE — ED Notes (Signed)
Bed: [id number] Expected date:  Expected time:  Means of arrival:  Comments: EMS M hand swelling

## 2014-01-14 NOTE — ED Provider Notes (Signed)
CSN: [id number]     Arrival date & time 01/14/14  0110 History   First MD Initiated Contact with Patient 01/14/14 0125     Chief Complaint  Patient presents with  . Hand Pain     (Consider location/radiation/quality/duration/timing/severity/associated sxs/prior Treatment) HPI Comments: 43 year old male presents to the emergency department for her pain to his distal right middle finger x1.5 weeks. Patient has not tried anything for symptoms, but states that the pain has been gradually worsening and prevented him from sleeping this evening. Patient states that he believes he jammed his finger prior to symptom onset, though he is a poor historian with respect to the exact injury. Symptoms associated with swelling of distal right third finger. Patient denies fever, red linear streaking, pallor, numbness, and weakness.  Patient is a 43 y.o. male presenting with hand pain. The history is provided by the patient. No language interpreter was used.  Hand Pain This is a new problem. Episode onset: 1 week ago. The problem occurs constantly. The problem has been gradually worsening. Associated symptoms include myalgias. Pertinent negatives include no chills, diaphoresis, fever, numbness, vomiting or weakness. Exacerbated by: palpation to distal R 3rd finger. He has tried nothing for the symptoms. Improvement on treatment: n/a.    Past Medical History  Diagnosis Date  . Bipolar disorder   . Schizophrenia   . Asthma   . Hypertension    Past Surgical History  Procedure Laterality Date  . Knee surgery     History reviewed. No pertinent family history. History  Substance Use Topics  . Smoking status: Current Some Day Smoker -- 0.50 packs/day for 10 years    Types: Cigarettes  . Smokeless tobacco: Never Used  . Alcohol Use: No     Comment: Pt stated that he rarely consumes alcohol    Review of Systems  Constitutional: Negative for fever, chills and diaphoresis.  Gastrointestinal: Negative for  vomiting.  Musculoskeletal: Positive for myalgias.       Swelling and pain to distal R 3rd digit  Skin: Negative for color change and pallor.  Neurological: Negative for weakness and numbness.  All other systems reviewed and are negative.     Allergies  Vistaril  Home Medications   Prior to Admission medications   Medication Sig Start Date End Date Taking? Authorizing Provider  cephALEXin (KEFLEX) 500 MG capsule Take 1 capsule (500 mg total) by mouth 4 (four) times daily. 01/14/14   Antony MaduraKelly Varnika Butz, PA-C  diclofenac sodium (VOLTAREN) 1 % GEL Apply 2 g topically 2 (two) times daily as needed (for shoulder pain).    Historical Provider, MD  fluticasone (FLONASE) 50 MCG/ACT nasal spray Place 2 sprays into the nose daily as needed for rhinitis or allergies.     Historical Provider, MD  QUEtiapine (SEROQUEL XR) 300 MG 24 hr tablet Take 300 mg by mouth at bedtime.    Historical Provider, MD  sulfamethoxazole-trimethoprim (BACTRIM DS,SEPTRA DS) 800-160 MG per tablet Take 1 tablet by mouth 2 (two) times daily. 01/14/14 01/21/14  Antony MaduraKelly Sherron Mapp, PA-C   BP 141/85  Pulse 80  Temp(Src) 97.5 F (36.4 C) (Oral)  Resp 18  SpO2 95%  Physical Exam  Nursing note and vitals reviewed. Constitutional: He is oriented to person, place, and time. He appears well-developed and well-nourished. No distress.  HENT:  Head: Normocephalic and atraumatic.  Eyes: Conjunctivae and EOM are normal. No scleral icterus.  Neck: Normal range of motion.  Cardiovascular: Normal rate, regular rhythm and intact distal pulses.  Distal radial pulse 2+ and right upper extremity. Capillary refill normal in all digits of right hand.  Pulmonary/Chest: Effort normal. No respiratory distress.  Musculoskeletal: Normal range of motion.       Right hand: He exhibits tenderness and swelling. He exhibits normal range of motion, no bony tenderness, normal capillary refill and no deformity. Normal sensation noted. Normal strength noted.        Hands: Swelling of distal tip of R 3rd finger. Swelling most remarkable to dorsal medial aspect around nailbed with visible underlying purulent pus pocket. Patient also with TTP to pad of R finger with, what appears to be a, purulent pocket on medial aspect of R 3rd finger not communicating with paronychia; ? felon.  Neurological: He is alert and oriented to person, place, and time.  No gross sensory deficits appreciated. Patient able to move all fingers.  Skin: Skin is warm and dry. No rash noted. He is not diaphoretic. No erythema. No pallor.  Psychiatric: He has a normal mood and affect. His behavior is normal.    ED Course  Procedures (including critical care time) Labs Review Labs Reviewed - No data to display  Imaging Review No results found.   EKG Interpretation None     INCISION AND DRAINAGE Performed by: Antony MaduraKelly Jalayla Chrismer Consent: Verbal consent obtained. Risks and benefits: risks, benefits and alternatives were discussed Type: abscess  Body area: R middle finger; paronychia  Anesthesia: digital block  Incision was made with a scalpel.  Local anesthetic: lidocaine 2% without epinephrine  Anesthetic total: 8.5 ml  Complexity: simple  Drainage: purulent  Drainage amount: moderate  Packing material: none  Patient tolerance: Patient tolerated the procedure well with no immediate complications.   MDM   Final diagnoses:  Paronychia    Patient presents to ED today for R 3rd finger pain. Patient neurovascularly intact without sensory deficits. Physical exam findings c/w paronychia with questionable unassociated felon. I&D performed which patient tolerated well. Purulent drainage appreciated from incision to nailbed. Incision also made along median aspect of finger to area where patient appeared to have distinct pus pocket c/w felon. No purulent drainage appreciated from this incision. Wound dressed with pressure dressing and patient instructed to f/u with hand  specialist. Will place patient on Bactrim/Keflex. Finger splint applied. Return precautions discussed and patient agreeable to plan with no unaddressed concerns.   Filed Vitals:   01/14/14 0113 01/14/14 0330  BP: 147/98 141/85  Pulse: 91 80  Temp: 97.5 F (36.4 C) 97.5 F (36.4 C)  TempSrc: Oral Oral  Resp: 18 18  SpO2: 95%        Antony MaduraKelly Odilon Cass, PA-C 01/14/14 [phone]

## 2014-01-14 NOTE — ED Notes (Signed)
Patient call EMS with a complaint of right middle finger pain that is preventing him from sleeping.

## 2014-01-14 NOTE — Discharge Instructions (Signed)
Recommend follow up with a hand specialist. Take the antibiotics prescribed for the full course. Change your dressing once per day. Return if symptoms worsen.  Paronychia Paronychia is an inflammatory reaction involving the folds of the skin surrounding the fingernail. This is commonly caused by an infection in the skin around a nail. The most common cause of paronychia is frequent wetting of the hands (as seen with bartenders, food servers, nurses or others who wet their hands). This makes the skin around the fingernail susceptible to infection by bacteria (germs) or fungus. Other predisposing factors are:  Aggressive manicuring.  Nail biting.  Thumb sucking. The most common cause is a staphylococcal (a type of germ) infection, or a fungal (Candida) infection. When caused by a germ, it usually comes on suddenly with redness, swelling, pus and is often painful. It may get under the nail and form an abscess (collection of pus), or form an abscess around the nail. If the nail itself is infected with a fungus, the treatment is usually prolonged and may require oral medicine for up to one year. Your caregiver will determine the length of time treatment is required. The paronychia caused by bacteria (germs) may largely be avoided by not pulling on hangnails or picking at cuticles. When the infection occurs at the tips of the finger it is called felon. When the cause of paronychia is from the herpes simplex virus (HSV) it is called herpetic whitlow. TREATMENT  When an abscess is present treatment is often incision and drainage. This means that the abscess must be cut open so the pus can get out. When this is done, the following home care instructions should be followed. HOME CARE INSTRUCTIONS   It is important to keep the affected fingers very dry. Rubber or plastic gloves over cotton gloves should be used whenever the hand must be placed in water.  Keep wound clean, dry and dressed as suggested by your  caregiver between warm soaks or warm compresses.  Soak in warm water for fifteen to twenty minutes three to four times per day for bacterial infections. Fungal infections are very difficult to treat, so often require treatment for long periods of time.  For bacterial (germ) infections take antibiotics (medicine which kill germs) as directed and finish the prescription, even if the problem appears to be solved before the medicine is gone.  Only take over-the-counter or prescription medicines for pain, discomfort, or fever as directed by your caregiver. SEEK IMMEDIATE MEDICAL CARE IF:  You have redness, swelling, or increasing pain in the wound.  You notice pus coming from the wound.  You have a fever.  You notice a bad smell coming from the wound or dressing. Document Released: 03/11/2001 Document Revised: 12/08/2011 Document Reviewed: 11/10/2008 [facility] Center[facility] Patient Information 2014 [place], [place].

## 2014-01-14 NOTE — ED Notes (Signed)
Pt. Wound cleaned up with saline and gauze. Pt. Finger splint applied and wrapped in kerlix with tape.

## 2014-01-15 NOTE — ED Provider Notes (Signed)
Medical screening examination/treatment/procedure(s) were performed by non-physician practitioner and as supervising physician I was immediately available for consultation/collaboration.   EKG Interpretation None        Allex Madia, MD 01/15/14 0710 

## 2014-04-16 ENCOUNTER — Emergency Department (HOSPITAL_COMMUNITY)
Admission: EM | Admit: 2014-04-16 | Discharge: 2014-04-16 | Disposition: A | Discharge status: Home / Self Care | Payer: Medicaid Other | Attending: Emergency Medicine | Admitting: Emergency Medicine

## 2014-04-16 ENCOUNTER — Encounter (HOSPITAL_COMMUNITY): Payer: Self-pay | Admitting: Emergency Medicine

## 2014-04-16 DIAGNOSIS — S8990XA Unspecified injury of unspecified lower leg, initial encounter: Secondary | ICD-10-CM | POA: Diagnosis present

## 2014-04-16 DIAGNOSIS — F209 Schizophrenia, unspecified: Secondary | ICD-10-CM | POA: Diagnosis not present

## 2014-04-16 DIAGNOSIS — F17200 Nicotine dependence, unspecified, uncomplicated: Secondary | ICD-10-CM | POA: Insufficient documentation

## 2014-04-16 DIAGNOSIS — F319 Bipolar disorder, unspecified: Secondary | ICD-10-CM | POA: Insufficient documentation

## 2014-04-16 DIAGNOSIS — Y9389 Activity, other specified: Secondary | ICD-10-CM | POA: Diagnosis not present

## 2014-04-16 DIAGNOSIS — S99929A Unspecified injury of unspecified foot, initial encounter: Principal | ICD-10-CM

## 2014-04-16 DIAGNOSIS — Z79899 Other long term (current) drug therapy: Secondary | ICD-10-CM | POA: Insufficient documentation

## 2014-04-16 DIAGNOSIS — Z9889 Other specified postprocedural states: Secondary | ICD-10-CM | POA: Diagnosis not present

## 2014-04-16 DIAGNOSIS — I10 Essential (primary) hypertension: Secondary | ICD-10-CM | POA: Insufficient documentation

## 2014-04-16 DIAGNOSIS — J45909 Unspecified asthma, uncomplicated: Secondary | ICD-10-CM | POA: Diagnosis not present

## 2014-04-16 DIAGNOSIS — S99919A Unspecified injury of unspecified ankle, initial encounter: Secondary | ICD-10-CM | POA: Diagnosis present

## 2014-04-16 DIAGNOSIS — M25562 Pain in left knee: Secondary | ICD-10-CM

## 2014-04-16 DIAGNOSIS — Y92410 Unspecified street and highway as the place of occurrence of the external cause: Secondary | ICD-10-CM | POA: Insufficient documentation
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
## 2014-04-16 NOTE — ED Provider Notes (Signed)
Medical screening examination/treatment/procedure(s) were performed by non-physician practitioner and as supervising physician I was immediately available for consultation/collaboration.   EKG Interpretation None        Mekhai Venuto N Angelino Rumery, DO 04/16/14 2259 
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
## 2014-04-16 NOTE — Discharge Instructions (Signed)
Rest, ice and elevate your knee. Followup with orthopedics in one week if no improvement. He may take Tylenol or ibuprofen every 6-8 hours as needed for pain.  Knee Pain The knee is the complex joint between your thigh and your lower leg. It is made up of bones, tendons, ligaments, and cartilage. The bones that make up the knee are:  The femur in the thigh.  The tibia and fibula in the lower leg.  The patella or kneecap riding in the groove on the lower femur. CAUSES  Knee pain is a common complaint with many causes. A few of these causes are:  Injury, such as:  A ruptured ligament or tendon injury.  Torn cartilage.  Medical conditions, such as:  Gout  Arthritis  Infections  Overuse, over training, or overdoing a physical activity. Knee pain can be minor or severe. Knee pain can accompany debilitating injury. Minor knee problems often respond well to self-care measures or get well on their own. More serious injuries may need medical intervention or even surgery. SYMPTOMS The knee is complex. Symptoms of knee problems can vary widely. Some of the problems are:  Pain with movement and weight bearing.  Swelling and tenderness.  Buckling of the knee.  Inability to straighten or extend your knee.  Your knee locks and you cannot straighten it.  Warmth and redness with pain and fever.  Deformity or dislocation of the kneecap. DIAGNOSIS  Determining what is wrong may be very straight forward such as when there is an injury. It can also be challenging because of the complexity of the knee. Tests to make a diagnosis may include:  Your caregiver taking a history and doing a physical exam.  Routine X-rays can be used to rule out other problems. X-rays will not reveal a cartilage tear. Some injuries of the knee can be diagnosed by:  Arthroscopy a surgical technique by which a small video camera is inserted through tiny incisions on the sides of the knee. This procedure is used  to examine and repair internal knee joint problems. Tiny instruments can be used during arthroscopy to repair the torn knee cartilage (meniscus).  Arthrography is a radiology technique. A contrast liquid is directly injected into the knee joint. Internal structures of the knee joint then become visible on X-ray film.  An MRI scan is a non X-ray radiology procedure in which magnetic fields and a computer produce two- or three-dimensional images of the inside of the knee. Cartilage tears are often visible using an MRI scanner. MRI scans have largely replaced arthrography in diagnosing cartilage tears of the knee.  Blood work.  Examination of the fluid that helps to lubricate the knee joint (synovial fluid). This is done by taking a sample out using a needle and a syringe. TREATMENT The treatment of knee problems depends on the cause. Some of these treatments are:  Depending on the injury, proper casting, splinting, surgery, or physical therapy care will be needed.  Give yourself adequate recovery time. Do not overuse your joints. If you begin to get sore during workout routines, back off. Slow down or do fewer repetitions.  For repetitive activities such as cycling or running, maintain your strength and nutrition.  Alternate muscle groups. For example, if you are a weight lifter, work the upper body on one day and the lower body the next.  Either tight or weak muscles do not give the proper support for your knee. Tight or weak muscles do not absorb the stress placed  on the knee joint. Keep the muscles surrounding the knee strong.  Take care of mechanical problems.  If you have flat feet, orthotics or special shoes may help. See your caregiver if you need help.  Arch supports, sometimes with wedges on the inner or outer aspect of the heel, can help. These can shift pressure away from the side of the knee most bothered by osteoarthritis.  A brace called an "unloader" brace also may be used to  help ease the pressure on the most arthritic side of the knee.  If your caregiver has prescribed crutches, braces, wraps or ice, use as directed. The acronym for this is PRICE. This means protection, rest, ice, compression, and elevation.  Nonsteroidal anti-inflammatory drugs (NSAIDs), can help relieve pain. But if taken immediately after an injury, they may actually increase swelling. Take NSAIDs with food in your stomach. Stop them if you develop stomach problems. Do not take these if you have a history of ulcers, stomach pain, or bleeding from the bowel. Do not take without your caregiver's approval if you have problems with fluid retention, heart failure, or kidney problems.  For ongoing knee problems, physical therapy may be helpful.  Glucosamine and chondroitin are over-the-counter dietary supplements. Both may help relieve the pain of osteoarthritis in the knee. These medicines are different from the usual anti-inflammatory drugs. Glucosamine may decrease the rate of cartilage destruction.  Injections of a corticosteroid drug into your knee joint may help reduce the symptoms of an arthritis flare-up. They may provide pain relief that lasts a few months. You may have to wait a few months between injections. The injections do have a small increased risk of infection, water retention, and elevated blood sugar levels.  Hyaluronic acid injected into damaged joints may ease pain and provide lubrication. These injections may work by reducing inflammation. A series of shots may give relief for as long as 6 months.  Topical painkillers. Applying certain ointments to your skin may help relieve the pain and stiffness of osteoarthritis. Ask your pharmacist for suggestions. Many over the-counter products are approved for temporary relief of arthritis pain.  In some countries, doctors often prescribe topical NSAIDs for relief of chronic conditions such as arthritis and tendinitis. A review of treatment with  NSAID creams found that they worked as well as oral medications but without the serious side effects. PREVENTION  Maintain a healthy weight. Extra pounds put more strain on your joints.  Get strong, stay limber. Weak muscles are a common cause of knee injuries. Stretching is important. Include flexibility exercises in your workouts.  Be smart about exercise. If you have osteoarthritis, chronic knee pain or recurring injuries, you may need to change the way you exercise. This does not mean you have to stop being active. If your knees ache after jogging or playing basketball, consider switching to swimming, water aerobics, or other low-impact activities, at least for a few days a week. Sometimes limiting high-impact activities will provide relief.  Make sure your shoes fit well. Choose footwear that is right for your sport.  Protect your knees. Use the proper gear for knee-sensitive activities. Use kneepads when playing volleyball or laying carpet. Buckle your seat belt every time you drive. Most shattered kneecaps occur in car accidents.  Rest when you are tired. SEEK MEDICAL CARE IF:  You have knee pain that is continual and does not seem to be getting better.  SEEK IMMEDIATE MEDICAL CARE IF:  Your knee joint feels hot to the touch  and you have a high fever. MAKE SURE YOU:   Understand these instructions.  Will watch your condition.  Will get help right away if you are not doing well or get worse. Document Released: 07/13/2007 Document Revised: 12/08/2011 Document Reviewed: 07/13/2007 [facility] Patient Information 2015 [place], [place]. This information is not intended to replace advice given to you by your health care provider. Make sure you discuss any questions you have with your health care provider.

## 2014-04-16 NOTE — ED Notes (Signed)
Discharge and follow up instructions reviewed with pt. Pt verbalized understanding.  

## 2016-02-16 DIAGNOSIS — F22 Delusional disorders: Secondary | ICD-10-CM | POA: Diagnosis not present

## 2016-02-16 DIAGNOSIS — F250 Schizoaffective disorder, bipolar type: Secondary | ICD-10-CM | POA: Diagnosis not present

## 2016-02-16 DIAGNOSIS — J45909 Unspecified asthma, uncomplicated: Secondary | ICD-10-CM | POA: Diagnosis not present

## 2016-02-16 DIAGNOSIS — I10 Essential (primary) hypertension: Secondary | ICD-10-CM | POA: Diagnosis not present

## 2016-02-16 DIAGNOSIS — Z79899 Other long term (current) drug therapy: Secondary | ICD-10-CM | POA: Insufficient documentation

## 2016-02-16 DIAGNOSIS — F17210 Nicotine dependence, cigarettes, uncomplicated: Secondary | ICD-10-CM | POA: Insufficient documentation

## 2016-02-16 DIAGNOSIS — R440 Auditory hallucinations: Secondary | ICD-10-CM | POA: Diagnosis present

## 2016-02-17 ENCOUNTER — Encounter (HOSPITAL_COMMUNITY): Payer: Self-pay | Admitting: Emergency Medicine

## 2016-02-17 ENCOUNTER — Emergency Department (HOSPITAL_COMMUNITY)
Admission: EM | Admit: 2016-02-17 | Discharge: 2016-02-18 | Disposition: A | Discharge status: Home / Self Care | Payer: Medicaid Other | Attending: Emergency Medicine | Admitting: Emergency Medicine

## 2016-02-17 DIAGNOSIS — R440 Auditory hallucinations: Secondary | ICD-10-CM | POA: Diagnosis not present

## 2016-02-17 DIAGNOSIS — F22 Delusional disorders: Secondary | ICD-10-CM | POA: Diagnosis not present

## 2016-02-17 DIAGNOSIS — F250 Schizoaffective disorder, bipolar type: Secondary | ICD-10-CM | POA: Diagnosis not present

## 2016-02-17 LAB — RAPID URINE DRUG SCREEN, HOSP PERFORMED
Amphetamines: NOT DETECTED
BARBITURATES: NOT DETECTED
BENZODIAZEPINES: NOT DETECTED
Cocaine: NOT DETECTED
Opiates: NOT DETECTED
Tetrahydrocannabinol: NOT DETECTED

## 2016-02-17 LAB — ETHANOL

## 2016-02-17 LAB — COMPREHENSIVE METABOLIC PANEL
ALBUMIN: 4.2 g/dL (ref 3.5–5.0)
ALK PHOS: 67 U/L (ref 38–126)
ALT: 21 U/L (ref 17–63)
AST: 22 U/L (ref 15–41)
Anion gap: 7 (ref 5–15)
BILIRUBIN TOTAL: 0.6 mg/dL (ref 0.3–1.2)
BUN: 11 mg/dL (ref 6–20)
CALCIUM: 8.6 mg/dL — AB (ref 8.9–10.3)
CO2: 27 mmol/L (ref 22–32)
CREATININE: 1.23 mg/dL (ref 0.61–1.24)
Chloride: 106 mmol/L (ref 101–111)
GFR calc Af Amer: >60 mL/min (ref >60)
Glucose, Bld: 115 mg/dL — ABNORMAL HIGH (ref 65–99)
POTASSIUM: 3 mmol/L — AB (ref 3.5–5.1)
Sodium: 140 mmol/L (ref 135–145)
TOTAL PROTEIN: 7 g/dL (ref 6.5–8.1)

## 2016-02-17 LAB — CBC
HEMATOCRIT: 40.5 % (ref 39.0–52.0)
Hemoglobin: 14.3 g/dL (ref 13.0–17.0)
MCH: 33.3 pg (ref 26.0–34.0)
MCHC: 35.3 g/dL (ref 30.0–36.0)
MCV: 94.4 fL (ref 78.0–100.0)
PLATELETS: 141 10*3/uL — AB (ref 150–400)
RBC: 4.29 MIL/uL (ref 4.22–5.81)
RDW: 13 % (ref 11.5–15.5)
WBC: 6.9 10*3/uL (ref 4.0–10.5)

## 2016-02-17 LAB — ACETAMINOPHEN LEVEL: Acetaminophen (Tylenol), Serum: <10 ug/mL — ABNORMAL LOW (ref 10–30)

## 2016-02-17 LAB — SALICYLATE LEVEL: Salicylate Lvl: <4 mg/dL (ref 2.8–30.0)

## 2016-02-17 MED ORDER — IBUPROFEN 200 MG PO TABS: 600.0000 mg | ORAL_TABLET | Freq: Three times a day (TID) | ORAL | Status: DC | PRN

## 2016-02-17 MED ORDER — LORAZEPAM 1 MG PO TABS
1.0000 mg | ORAL_TABLET | Freq: Three times a day (TID) | ORAL | Status: DC | PRN
  Administered 2016-02-17: 1 mg via ORAL
  Filled 2016-02-17: qty 1

## 2016-02-17 MED ORDER — ALUM & MAG HYDROXIDE-SIMETH 200-200-20 MG/5ML PO SUSP: 30.0000 mL | ORAL | Status: DC | PRN

## 2016-02-17 MED ORDER — POTASSIUM CHLORIDE CRYS ER 20 MEQ PO TBCR
40.0000 meq | EXTENDED_RELEASE_TABLET | Freq: Every day | ORAL | Status: DC
  Administered 2016-02-17 – 2016-02-18 (×2): 40 meq via ORAL
  Filled 2016-02-17 (×2): qty 2

## 2016-02-17 MED ORDER — QUETIAPINE FUMARATE ER 400 MG PO TB24
800.0000 mg | ORAL_TABLET | Freq: Every day | ORAL | Status: DC
  Administered 2016-02-17: 800 mg via ORAL
  Filled 2016-02-17 (×3): qty 2

## 2016-02-17 MED ORDER — ONDANSETRON HCL 4 MG PO TABS: 4.0000 mg | ORAL_TABLET | Freq: Three times a day (TID) | ORAL | Status: DC | PRN

## 2016-02-17 MED ORDER — NICOTINE 21 MG/24HR TD PT24
21.0000 mg | MEDICATED_PATCH | Freq: Every day | TRANSDERMAL | Status: DC
  Administered 2016-02-17 – 2016-02-18 (×2): 21 mg via TRANSDERMAL
  Filled 2016-02-17 (×2): qty 1

## 2016-02-17 MED ORDER — POTASSIUM CHLORIDE CRYS ER 20 MEQ PO TBCR
80.0000 meq | EXTENDED_RELEASE_TABLET | Freq: Once | ORAL | Status: AC
  Administered 2016-02-17: 80 meq via ORAL
  Filled 2016-02-17: qty 4

## 2016-02-17 MED ORDER — ZOLPIDEM TARTRATE 5 MG PO TABS
5.0000 mg | ORAL_TABLET | Freq: Every evening | ORAL | Status: DC | PRN
  Administered 2016-02-17: 5 mg via ORAL
  Filled 2016-02-17: qty 1

## 2016-02-17 NOTE — ED Notes (Signed)
Patient seen watching TV. Pleasant upon approach. Denies pain, SI, AH/VH at this time. Verbalizes no concern. Every 15 minutes check maintained for safety.  Will continue to monitor patient for safety and stability.
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
## 2016-02-17 NOTE — ED Notes (Signed)
[facility]ity fund[facility]. [facility] Patient's belonging bag removed from locker 31 and given tom [facility] staff.

## 2016-02-17 NOTE — ED Notes (Signed)
Pt states he has been hearing voices since his environment has become so stressful.
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
## 2016-02-17 NOTE — BH Assessment (Signed)
Tele Assessment Note   Ryan York is an 45 y.o. male presenting to [facility] reporting auditory hallucinations. Pt stated "I am hearing voices". "They are telling me people are out to get me". "They hating on me". Pt denies SI and HI at this time. Pt did not report any previous suicide attempts or self-injurious behaviors. Pt reported that he is currently seeing a psychiatrist; however he is unable to recall their name. PT has had multiple inpatient hospitalizations. Pt did not report any alcohol or illicit substance use at this time. Pt's UDS is pending. Pt was drowsy throughout this assessment and provided minimal information.  Pt will need to be evaluated by the psychiatrist for final disposition.   Diagnosis: Schizoaffective disorder   Past Medical History:  Past Medical History  Diagnosis Date  . Bipolar disorder (HCC)   . Schizophrenia (HCC)   . Asthma   . Hypertension     Past Surgical History  Procedure Laterality Date  . Knee surgery      Family History: No family history on file.  Social History:  reports that he has been smoking Cigarettes.  He has a 5 pack-year smoking history. He has never used smokeless tobacco. He reports that he does not drink alcohol or use illicit drugs.  Additional Social History:  Alcohol / Drug Use History of alcohol / drug use?: No history of alcohol / drug abuse  CIWA: CIWA-Ar BP: 106/75 mmHg Pulse Rate: (!) 58 COWS:    PATIENT STRENGTHS: (choose at least two) Average or above average intelligence Motivation for treatment/growth  Allergies:  Allergies  Allergen Reactions  . Vistaril [Hydroxyzine] Nausea Only    Severe stomach pain    Home Medications:  (Not in a hospital admission)  OB/GYN Status:  No LMP for male patient.  General Assessment Data Location of Assessment: [facility] ED TTS Assessment: In system Is this a Tele or Face-to-Face Assessment?: Face-to-Face Is this an Initial Assessment or a Re-assessment for this  encounter?: Initial Assessment Marital status: Single Living Arrangements: Alone Can pt return to current living arrangement?: Yes Admission Status: Voluntary Is patient capable of signing voluntary admission?: Yes Referral Source: Self/Family/Friend Insurance type: Medicaid      Crisis Care Plan Living Arrangements: Alone Name of Psychiatrist:  (Pt unable to recall at this time. ) Name of Therapist: None  Education Status Is patient currently in school?: No  Risk to self with the past 6 months Suicidal Ideation: No Has patient been a risk to self within the past 6 months prior to admission? : No Suicidal Intent: No Has patient had any suicidal intent within the past 6 months prior to admission? : No Is patient at risk for suicide?: No Suicidal Plan?: No Has patient had any suicidal plan within the past 6 months prior to admission? : No Access to Means: No What has been your use of drugs/alcohol within the last 12 months?: Pt denies  Previous Attempts/Gestures: No How many times?: 0 Other Self Harm Risks: Pt denies  Triggers for Past Attempts: None known (No previous attempts reported. ) Intentional Self Injurious Behavior: None Family Suicide History: No Recent stressful life event(s):  (Pt denies ) Persecutory voices/beliefs?: No Depression: No Depression Symptoms: Isolating, Loss of interest in usual pleasures Substance abuse history and/or treatment for substance abuse?: No Suicide prevention information given to non-admitted patients: Not applicable  Risk to Others within the past 6 months Homicidal Ideation: No Does patient have any lifetime risk of violence toward others  beyond the six months prior to admission? : No Thoughts of Harm to Others: No Current Homicidal Intent: No Current Homicidal Plan: No Access to Homicidal Means: No Identified Victim: N/A History of harm to others?: No Assessment of Violence: None Noted Violent Behavior Description: No  violent behaviors observed. Pt is calm and cooperative at this time.  Does patient have access to weapons?: No Criminal Charges Pending?: No Does patient have a court date: No Is patient on probation?: No  Psychosis Hallucinations: Auditory Delusions: None noted  Mental Status Report Appearance/Hygiene: In scrubs Eye Contact: Poor Motor Activity: Freedom of movement Speech: Soft Level of Consciousness: Drowsy Mood: Pleasant Affect: Appropriate to circumstance Anxiety Level: None Thought Processes: Relevant, Coherent Judgement: Unimpaired Orientation: Appropriate for developmental age Obsessive Compulsive Thoughts/Behaviors: None  Cognitive Functioning Concentration: Normal Memory: Recent Intact, Remote Intact IQ: Average Insight: Fair Impulse Control: Fair Appetite: Fair Sleep: Decreased Total Hours of Sleep: 6 Vegetative Symptoms: Unable to Assess  ADLScreening [facility]) Patient's cognitive ability adequate to safely complete daily activities?: Yes Patient able to express need for assistance with ADLs?: Yes Independently performs ADLs?: Yes (appropriate for developmental age)  Prior Inpatient Therapy Prior Inpatient Therapy: Yes Prior Therapy Dates: 2010, 2012, 2013 [facility]  Reason for Treatment: Schizoaffective disorder   Prior Outpatient Therapy Prior Outpatient Therapy: No Does patient have an ACCT team?: Unknown Does patient have Intensive In-House Services?  : No Does patient have [facility] services? : Unknown Does patient have P4CC services?: Unknown  ADL Screening (condition at time of admission) Patient's cognitive ability adequate to safely complete daily activities?: Yes Is the patient deaf or have difficulty hearing?: No Does the patient have difficulty seeing, even when wearing glasses/contacts?: No Does the patient have difficulty concentrating, remembering, or making decisions?: No Patient able  to express need for assistance with ADLs?: Yes Does the patient have difficulty dressing or bathing?: No Independently performs ADLs?: Yes (appropriate for developmental age)       Abuse/Neglect Assessment (Assessment to be complete while patient is alone) Physical Abuse: Denies Verbal Abuse: Denies Sexual Abuse: Denies Exploitation of patient/patient's resources: Denies Self-Neglect: Denies          Additional Information 1:1 In Past 12 Months?: No CIRT Risk: No Elopement Risk: No Does patient have medical clearance?: Yes     Disposition:  Disposition Initial Assessment Completed for this Encounter: Yes Disposition of Patient: Other dispositions Other disposition(s): Other (Comment) (AM Psych eval )  Aleira Deiter S 02/17/2016 5:45 AM

## 2016-02-17 NOTE — ED Notes (Signed)
TTS consult in progress. °

## 2016-02-17 NOTE — ED Provider Notes (Signed)
CSN: [id number]     Arrival date & time 02/16/16  2358 History   First MD Initiated Contact with Patient 02/17/16 0158     Chief Complaint  Patient presents with  . Medical Clearance  . Hallucinations     (Consider location/radiation/quality/duration/timing/severity/associated sxs/prior Treatment) The history is provided by the patient and medical records. No language interpreter was used.    Ryan York is a 45 y.o. male  with a hx of Bipolar disorder, schizophrenia, asthma, hypertension presents to the Emergency Department complaining of gradual, persistent, progressively worsening Auditory command hallucinations.  Patient reports that he often has these however they have gotten significantly worse in the last 2 days. He states that the voices tell him to harm himself but is not specific about how. He reports that he feels suicidal but has not made a plan to kill himself. He denies homicidal ideations. Patient reports that he only sometimes has visual hallucinations and denies any of this time. No other associated symptoms. Patient denies alcohol and drug usage. He is taking Seroquel and reports compliance. Patient denies fever, chills, headache, neck pain, chest pain, shortness of breath, abdominal pain, vomiting, diarrhea, weakness, dizziness, syncope, dysuria.  Past Medical History  Diagnosis Date  . Bipolar disorder (HCC)   . Schizophrenia (HCC)   . Asthma   . Hypertension    Past Surgical History  Procedure Laterality Date  . Knee surgery     No family history on file. Social History  Substance Use Topics  . Smoking status: Current Some Day Smoker -- 0.50 packs/day for 10 years    Types: Cigarettes  . Smokeless tobacco: Never Used  . Alcohol Use: No     Comment: Pt stated that he rarely consumes alcohol    Review of Systems  Constitutional: Negative for fever, diaphoresis, appetite change, fatigue and unexpected weight change.  HENT: Negative for mouth sores.    Eyes: Negative for visual disturbance.  Respiratory: Negative for cough, chest tightness, shortness of breath and wheezing.   Cardiovascular: Negative for chest pain.  Gastrointestinal: Negative for nausea, vomiting, abdominal pain, diarrhea and constipation.  Endocrine: Negative for polydipsia, polyphagia and polyuria.  Genitourinary: Negative for dysuria, urgency, frequency and hematuria.  Musculoskeletal: Negative for back pain and neck stiffness.  Skin: Negative for rash.  Allergic/Immunologic: Negative for immunocompromised state.  Neurological: Negative for syncope, light-headedness and headaches.  Hematological: Does not bruise/bleed easily.  Psychiatric/Behavioral: Positive for suicidal ideas and hallucinations. Negative for sleep disturbance. The patient is not nervous/anxious.       Allergies  Vistaril  Home Medications   Prior to Admission medications   Medication Sig Start Date End Date Taking? Authorizing Provider  fluticasone (FLONASE) 50 MCG/ACT nasal spray Place 2 sprays into the nose daily as needed for rhinitis or allergies.    Yes Historical Provider, MD  montelukast (SINGULAIR) 10 MG tablet Take 10 mg by mouth daily as needed. For allergies. 01/07/16  Yes Historical Provider, MD  QUEtiapine (SEROQUEL XR) 400 MG 24 hr tablet Take 800 mg by mouth at bedtime.   Yes Historical Provider, MD   BP 106/75 mmHg  Pulse 58  Temp(Src) 97.8 F (36.6 C) (Oral)  Resp 16  SpO2 100% Physical Exam  Constitutional: He appears well-developed and well-nourished. No distress.  Awake, alert, nontoxic appearance  HENT:  Head: Normocephalic and atraumatic.  Mouth/Throat: Oropharynx is clear and moist. No oropharyngeal exudate.  Eyes: Conjunctivae are normal. No scleral icterus.  Neck: Normal range of motion.  Neck supple.  Cardiovascular: Normal rate, regular rhythm, normal heart sounds and intact distal pulses.   Pulmonary/Chest: Effort normal and breath sounds normal. No  respiratory distress. He has no wheezes.  Equal chest expansion  Abdominal: Soft. Bowel sounds are normal. He exhibits no mass. There is no tenderness. There is no rebound and no guarding.  Musculoskeletal: Normal range of motion. He exhibits no edema.  Neurological: He is alert.  Speech is clear and goal oriented Moves extremities without ataxia  Skin: Skin is warm and dry. He is not diaphoretic.  Psychiatric: He has a normal mood and affect. He is actively hallucinating ( Auditory). He expresses suicidal ideation. He expresses no homicidal ideation. He expresses no suicidal plans and no homicidal plans.  Nursing note and vitals reviewed.   ED Course  Procedures (including critical care time) Labs Review Labs Reviewed  COMPREHENSIVE METABOLIC PANEL - Abnormal; Notable for the following:    Potassium 3.0 (*)    Glucose, Bld 115 (*)    Calcium 8.6 (*)    All other components within normal limits  ACETAMINOPHEN LEVEL - Abnormal; Notable for the following:    Acetaminophen (Tylenol), Serum <10 (*)    All other components within normal limits  CBC - Abnormal; Notable for the following:    Platelets 141 (*)    All other components within normal limits  ETHANOL  SALICYLATE LEVEL  URINE RAPID DRUG SCREEN, HOSP PERFORMED      EKG Interpretation   Date/Time:  Sunday Feb 17 2016 03:31:12 EDT Ventricular Rate:  51 PR Interval:  185 QRS Duration: 113 QT Interval:  418 QTC Calculation: 385 R Axis:   32 Text Interpretation:  Sinus rhythm Incomplete right bundle branch block No  previous ECGs available Confirmed by Bebe ShaggyWICKLINE  MD, Dorinda HillNALD ([id number]) on  02/17/2016 3:53:29 AM      MDM   Final diagnoses:  Schizoaffective disorder, bipolar type (HCC)  Auditory hallucinations   Ryan York presents with auditory hallucinations and a history of schizoaffective disorder. Patient's labs reviewed. He is noted to be hypokalemic at 3.0. EKG is without acute ischemia and patient is  without chest pain or shortness of breath.  He does have an incomplete right bundle branch block with no previous for comparison. Potassium repleted here in the emergency department and scheduled for the next 5 days.  He is here voluntarily. He will need [name] paperwork completed if he attempts to leave however he wishes for assistance. Discussed with TTS who will assess.  6:27 AM TTS recommends psychiatric evaluation in AM.    Dierdre ForthHannah Grier Czerwinski, PA-C 02/17/16 [phone]  Zadie Rhineonald Wickline, MD 02/18/16 [id number]

## 2016-02-17 NOTE — Consult Note (Signed)
Ryan Psychiatry Consult   Reason for Consult:  Paranoia and auditory hallucination Referring Physician:  EDP Patient Identification: Ryan York MRN:  [id number] Principal Diagnosis: Schizoaffective disorder, bipolar type [facility]) Diagnosis:   Patient Active Problem List   Diagnosis Date Noted  . Schizoaffective disorder, bipolar type ([place]) [F25.0] 04/20/2012    Priority: High    Total Time spent with patient: 45 minutes  Subjective:   Ryan York is a 45 y.o. male patient admitted with Paranoia, Auditory hallucination.  HPI:  AA male, 45 years old was evaluated today for increased auditory hallucination with the voices telling him to kill himself.  Patient also states that people around him make him uncomfortable and he feel like his life is threatened.[name]  He states that hearing voices makes his body and mind exhausted.  Patient was las hospitalized at [facility] in 2013.  Patient reports his stressors includes living in a small apartment which affects his breathing.  Patient currently is seeing a Psychiatrist but could not remember his name.   He denies SI/HI and he has been accepted for admission and we will be seeking placement at any facility with available.   Past Psychiatric History:  Schizoaffective disorder  Risk to Self: Suicidal Ideation: No Suicidal Intent: No Is patient at risk for suicide?: No Suicidal Plan?: No Access to Means: No What has been your use of drugs/alcohol within the last 12 months?: Pt denies  How many times?: 0 Other Self Harm Risks: Pt denies  Triggers for Past Attempts: None known (No previous attempts reported. ) Intentional Self Injurious Behavior: None Risk to Others: Homicidal Ideation: No Thoughts of Harm to Others: No Current Homicidal Intent: No Current Homicidal Plan: No Access to Homicidal Means: No Identified Victim: N/A History of harm to others?: No Assessment of Violence: None Noted Violent Behavior Description: No  violent behaviors observed. Pt is calm and cooperative at this time.  Does patient have access to weapons?: No Criminal Charges Pending?: No Does patient have a court date: No Prior Inpatient Therapy: Prior Inpatient Therapy: Yes Prior Therapy Dates: 2010, 2012, 2013 Prior Therapy Facilty/Provider(s): [facility]  Reason for Treatment: Schizoaffective disorder  Prior Outpatient Therapy: Prior Outpatient Therapy: No Does patient have an ACCT team?: Unknown Does patient have Intensive In-House Services?  : No Does patient have [facility] services? : Unknown Does patient have [facility] services?: Unknown  Past Medical History:  Past Medical History  Diagnosis Date  . Bipolar disorder ([place])   . Schizophrenia ([place])   . Asthma   . Hypertension     Past Surgical History  Procedure Laterality Date  . Knee surgery     Family History: No family history on file.   Family Psychiatric  History:  Unknown Social History:  History  Alcohol Use No    Comment: Pt stated that he rarely consumes alcohol     History  Drug Use No    Comment: Pt denies     Social History   Social History  . Marital Status: Single    Spouse Name: N/A  . Number of Children: N/A  . Years of Education: N/A   Social History Main Topics  . Smoking status: Current Some Day Smoker -- 0.50 packs/day for 10 years    Types: Cigarettes  . Smokeless tobacco: Never Used  . Alcohol Use: No     Comment: Pt stated that he rarely consumes alcohol  . Drug Use: No     Comment: Pt denies   .  Sexual Activity: Yes    Birth Control/ Protection: None   Other Topics Concern  . None   Social History Narrative   Additional Social History:    Allergies:   Allergies  Allergen Reactions  . Vistaril [Hydroxyzine] Nausea Only    Severe stomach pain    Labs:  Results for orders placed or performed during the hospital encounter of 02/17/16 (from the past 48 hour(s))  Comprehensive metabolic panel     Status: Abnormal    Collection Time: 02/17/16  1:22 AM  Result Value Ref Range   Sodium 140 135 - 145 mmol/L   Potassium 3.0 (L) 3.5 - 5.1 mmol/L   Chloride 106 101 - 111 mmol/L   CO2 27 22 - 32 mmol/L   Glucose, Bld 115 (H) 65 - 99 mg/dL   BUN 11 6 - 20 mg/dL   Creatinine, Ser 1.23 0.61 - 1.24 mg/dL   Calcium 8.6 (L) 8.9 - 10.3 mg/dL   Total Protein 7.0 6.5 - 8.1 g/dL   Albumin 4.2 3.5 - 5.0 g/dL   AST 22 15 - 41 U/L   ALT 21 17 - 63 U/L   Alkaline Phosphatase 67 38 - 126 U/L   Total Bilirubin 0.6 0.3 - 1.2 mg/dL   GFR calc non Af Amer >60 >60 mL/min   GFR calc Af Amer >60 >60 mL/min    Comment: (NOTE) The eGFR has been calculated using the CKD EPI equation. This calculation has not been validated in all clinical situations. eGFR's persistently <60 mL/min signify possible Chronic Kidney Disease.    Anion gap 7 5 - 15  Ethanol     Status: None   Collection Time: 02/17/16  1:22 AM  Result Value Ref Range   Alcohol, Ethyl (B) <5 <5 mg/dL    Comment:        LOWEST DETECTABLE LIMIT FOR SERUM ALCOHOL IS 5 mg/dL FOR MEDICAL PURPOSES ONLY   Salicylate level     Status: None   Collection Time: 02/17/16  1:22 AM  Result Value Ref Range   Salicylate Lvl <0.0 2.8 - 30.0 mg/dL  Acetaminophen level     Status: Abnormal   Collection Time: 02/17/16  1:22 AM  Result Value Ref Range   Acetaminophen (Tylenol), Serum <10 (L) 10 - 30 ug/mL    Comment:        THERAPEUTIC CONCENTRATIONS VARY SIGNIFICANTLY. A RANGE OF 10-30 ug/mL MAY BE AN EFFECTIVE CONCENTRATION FOR MANY PATIENTS. HOWEVER, SOME ARE BEST TREATED AT CONCENTRATIONS OUTSIDE THIS RANGE. ACETAMINOPHEN CONCENTRATIONS >150 ug/mL AT 4 HOURS AFTER INGESTION AND >50 ug/mL AT 12 HOURS AFTER INGESTION ARE OFTEN ASSOCIATED WITH TOXIC REACTIONS.   cbc     Status: Abnormal   Collection Time: 02/17/16  1:22 AM  Result Value Ref Range   WBC 6.9 4.0 - 10.5 K/uL   RBC 4.29 4.22 - 5.81 MIL/uL   Hemoglobin 14.3 13.0 - 17.0 g/dL   HCT 40.5 39.0 - 52.0  %   MCV 94.4 78.0 - 100.0 fL   MCH 33.3 26.0 - 34.0 pg   MCHC 35.3 30.0 - 36.0 g/dL   RDW 13.0 11.5 - 15.5 %   Platelets 141 (L) 150 - 400 K/uL  Rapid urine drug screen (hospital performed)     Status: None   Collection Time: 02/17/16  7:11 AM  Result Value Ref Range   Opiates NONE DETECTED NONE DETECTED   Cocaine NONE DETECTED NONE DETECTED   Benzodiazepines NONE DETECTED NONE DETECTED  Amphetamines NONE DETECTED NONE DETECTED   Tetrahydrocannabinol NONE DETECTED NONE DETECTED   Barbiturates NONE DETECTED NONE DETECTED    Comment:        DRUG SCREEN FOR MEDICAL PURPOSES ONLY.  IF CONFIRMATION IS NEEDED FOR ANY PURPOSE, NOTIFY LAB WITHIN 5 DAYS.        LOWEST DETECTABLE LIMITS FOR URINE DRUG SCREEN Drug Class       Cutoff (ng/mL) Amphetamine      1000 Barbiturate      200 Benzodiazepine   696 Tricyclics       295 Opiates          300 Cocaine          300 THC              50     Current Facility-Administered Medications  Medication Dose Route Frequency Provider Last Rate Last Dose  . alum & mag hydroxide-simeth (MAALOX/MYLANTA) 200-200-20 MG/5ML suspension 30 mL  30 mL Oral PRN Hannah Muthersbaugh, PA-C      . ibuprofen (ADVIL,MOTRIN) tablet 600 mg  600 mg Oral Q8H PRN Hannah Muthersbaugh, PA-C      . LORazepam (ATIVAN) tablet 1 mg  1 mg Oral Q8H PRN Hannah Muthersbaugh, PA-C      . nicotine (NICODERM CQ - dosed in mg/24 hours) patch 21 mg  21 mg Transdermal Daily Hannah Muthersbaugh, PA-C   21 mg at 02/17/16 0925  . ondansetron (ZOFRAN) tablet 4 mg  4 mg Oral Q8H PRN Hannah Muthersbaugh, PA-C      . potassium chloride SA (K-DUR,KLOR-CON) CR tablet 40 mEq  40 mEq Oral Daily Hannah Muthersbaugh, PA-C   40 mEq at 02/17/16 0924  . QUEtiapine (SEROQUEL XR) 24 hr tablet 800 mg  800 mg Oral QHS Hannah Muthersbaugh, PA-C      . zolpidem (AMBIEN) tablet 5 mg  5 mg Oral QHS PRN Abigail Butts, PA-C       Current Outpatient Prescriptions  Medication Sig Dispense Refill  .  fluticasone (FLONASE) 50 MCG/ACT nasal spray Place 2 sprays into the nose daily as needed for rhinitis or allergies.     . montelukast (SINGULAIR) 10 MG tablet Take 10 mg by mouth daily as needed. For allergies.  0  . QUEtiapine (SEROQUEL XR) 400 MG 24 hr tablet Take 800 mg by mouth at bedtime.      Musculoskeletal: Strength & Muscle Tone: within normal limits Gait & Station: normal Patient leans: N/A  Psychiatric Specialty Exam: Review of Systems  Constitutional: Negative.   HENT: Negative.   Eyes: Negative.   Respiratory: Negative.   Cardiovascular: Negative.   Gastrointestinal: Negative.   Genitourinary: Negative.   Skin: Negative.   Neurological: Negative.   Endo/Heme/Allergies: Negative.     Blood pressure 127/77, pulse 70, temperature 97.6 F (36.4 C), temperature source Oral, resp. rate 20, SpO2 97 %.There is no weight on file to calculate BMI.  General Appearance: Casual and Fairly Groomed  Eye Contact::  Good  Speech:  Clear and Coherent and Normal Rate  Volume:  Normal  Mood:  Angry, Anxious and Hopeless  Affect:  Congruent  Thought Process:  Coherent, Goal Directed and Intact  Orientation:  Full (Time, Place, and Person)  Thought Content:  Hallucinations: Auditory Command:  kill himself and Paranoid Ideation  Suicidal Thoughts:  No  Homicidal Thoughts:  No  Memory:  Immediate;   Good Recent;   Good Remote;   Good  Judgement:  Fair  Insight:  Fair  Psychomotor Activity:  Normal  Concentration:  Good  Recall:  [place] of Knowledge:Fair  Language: Good  Akathisia:  NA  Handed:  Right  AIMS (if indicated):     Assets:  Desire for Improvement Housing  ADL's:  Intact  Cognition: WNL  Sleep:      Treatment Plan Summary: Daily contact with patient to assess and evaluate symptoms and progress in treatment and Medication management  Disposition: Accepted for admission and we will be seeking placement at any facility with available bed.  We have resumed  all of his medications.  Delfin Gant, NP   PMHNP-BC 02/17/2016 3:07 PM  Patient seen face to face for this evaluation along with physician extender and case discussed with treatment team and formulated treatment plan. Reviewed the information documented and agree with the treatment plan.  Lulamae Skorupski,JANARDHAHA R. 02/18/2016 9:58 AM

## 2016-02-17 NOTE — BH Assessment (Signed)
Assessment completed. Consulted Maryjean Mornharles Kober, PA-C who recommended that pt be evaluated by the psychiatrist for final disposition. Informed Dierdre ForthHannah Muthersbaugh, PA-C of the recommendation.

## 2016-02-18 DIAGNOSIS — F250 Schizoaffective disorder, bipolar type: Secondary | ICD-10-CM | POA: Diagnosis not present

## 2016-02-18 NOTE — [facility] Suicide Risk Assessment (Signed)
Suicide Risk Assessment  Discharge Assessment   [facility]   Principal Problem: Schizoaffective disorder, bipolar type [facility](HCC) Discharge Diagnoses:  Patient Active Problem List   Diagnosis Date Noted  . Schizoaffective disorder, bipolar type (HCC) [F25.0] 04/20/2012    Priority: High    Total Time spent with patient: 45 minutes  Musculoskeletal: Strength & Muscle Tone: within normal limits Gait & Station: normal Patient leans: N/A  Psychiatric Specialty Exam: Physical Exam  Constitutional: He is oriented to person, place, and time. He appears well-developed and well-nourished.  HENT:  Head: Normocephalic.  Respiratory: Effort normal.  Musculoskeletal: Normal range of motion.  Neurological: He is alert and oriented to person, place, and time.  Skin: Skin is warm and dry.    Review of Systems  Constitutional: Negative.   HENT: Negative.   Eyes: Negative.   Respiratory: Negative.   Cardiovascular: Negative.   Gastrointestinal: Negative.   Genitourinary: Negative.   Musculoskeletal: Negative.   Skin: Negative.   Neurological: Negative.   Endo/Heme/Allergies: Negative.   Psychiatric/Behavioral: Positive for hallucinations.    Blood pressure 111/63, pulse 59, temperature 97.6 F (36.4 C), temperature source Oral, resp. rate 18, SpO2 100 %.There is no weight on file to calculate BMI.  General Appearance: Casual  Eye Contact:  Good  Speech:  Normal Rate  Volume:  Normal  Mood:  Euthymic  Affect:  Congruent  Thought Process:  Coherent  Orientation:  Full (Time, Place, and Person)  Thought Content:  Hallucinations: Auditory Visual  Suicidal Thoughts:  No  Homicidal Thoughts:  No  Memory:  Immediate;   Good Recent;   Good Remote;   Good  Judgement:  Fair  Insight:  Fair  Psychomotor Activity:  Normal  Concentration:  Concentration: Good and Attention Span: Good  Recall:  Good  Fund of Knowledge:  Good  Language:  Good  Akathisia:  No   Handed:  Right  AIMS (if indicated):     Assets:  Leisure Time Physical Health Resilience  ADL's:  Intact  Cognition:  WNL  Sleep:       Mental Status Per Nursing Assessment::   On Admission:   Increase in hallucinations  Demographic Factors:  Male  Loss Factors: NA  Historical Factors: NA  Risk Reduction Factors:   Sense of responsibility to family and Positive therapeutic relationship  Continued Clinical Symptoms:  None  Cognitive Features That Contribute To Risk:  None    Suicide Risk:  Minimal: No identifiable suicidal ideation.  Patients presenting with no risk factors but with morbid ruminations; may be classified as minimal risk based on the severity of the depressive symptoms    Plan Of Care/Follow-up recommendations:  Activity:  as tolerated  Diet:  heart healthy diet  Lazaria Schaben, NP 02/18/2016, 11:27 AM

## 2016-02-18 NOTE — Discharge Instructions (Signed)
For your ongoing mental health needs, you are advised to follow up with [facility].  New and returning patients are seen at their walk-in clinic.  Walk-in hours are Monday - Friday from 8:00 am - 3:00 pm.  Walk-in patients are seen on a first come, first served basis.  Try to arrive as early as possible for he best chance of being seen the same day: ° °     [facility] °     [address] °     [place], [place] [address] °     [phone] °

## 2016-02-18 NOTE — BH Assessment (Signed)
[facility] Assessment Progress Note  Per Carolanne GrumblingGerald Taylor, MD, this pt does not require psychiatric hospitalization at this time.  [facility].  Discharge instructions advise pt to follow up with [facility].  Pt's nurse, Diane, has been notified.  Doylene Canninghomas Lillieanna Tuohy, MA Triage Specialist [phone]

## 2016-02-18 NOTE — ED Notes (Signed)
Pt has hallucinations as baseline and came here to "get a little rest from my enviroment." He said that he is rested and ready to be discharged. He denies SI/HI. He does not appear to be responding to internal stimuli and does not appear to be delusional. All belongings returned to pt who signed for same.

## 2016-02-18 NOTE — Consult Note (Signed)
[place] Psychiatry Consult   Reason for Consult:  Increase in hallucinations Referring Physician:  EDP Patient Identification: Ryan York MRN:  [id number] Principal Diagnosis: Schizoaffective disorder, bipolar type [facility]) Diagnosis:   Patient Active Problem List   Diagnosis Date Noted  . Schizoaffective disorder, bipolar type ([place]) [F25.0] 04/20/2012    Priority: High    Total Time spent with patient: 45 minutes  Subjective:   Ryan York is a 45 y.o. male patient does not warrant admission.  HPI:  45 yo male who presented to the ED with an increase in hallucinations due to stress at his current living situation.  Denies suicidal/homicidal ideations, hallucinations, and alcohol/drug abuse.  He is a patient at [employer] and experiences hallucinations daily.  Ryan York sees people who talk to him, not command hallucinations.  He reports the reason for being here is to get a new place to live.  When told this is not what the ED does, he later decided he wanted to leave.  Stable for discharge.  Past Psychiatric History: schizoaffective disorder, bipolar type  Risk to Self: Suicidal Ideation: No Suicidal Intent: No Is patient at risk for suicide?: No Suicidal Plan?: No Access to Means: No What has been your use of drugs/alcohol within the last 12 months?: Pt denies  How many times?: 0 Other Self Harm Risks: Pt denies  Triggers for Past Attempts: None known (No previous attempts reported. ) Intentional Self Injurious Behavior: None Risk to Others: Homicidal Ideation: No Thoughts of Harm to Others: No Current Homicidal Intent: No Current Homicidal Plan: No Access to Homicidal Means: No Identified Victim: N/A History of harm to others?: No Assessment of Violence: None Noted Violent Behavior Description: No violent behaviors observed. Pt is calm and cooperative at this time.  Does patient have access to weapons?: No Criminal Charges Pending?: No Does patient  have a court date: No Prior Inpatient Therapy: Prior Inpatient Therapy: Yes Prior Therapy Dates: 2010, 2012, 2013 Prior Therapy Facilty/Provider(s): [facility]  Reason for Treatment: Schizoaffective disorder  Prior Outpatient Therapy: Prior Outpatient Therapy: No Does patient have an ACCT team?: Unknown Does patient have Intensive In-House Services?  : No Does patient have [facility] services? : Unknown Does patient have P4CC services?: Unknown  Past Medical History:  Past Medical History  Diagnosis Date  . Bipolar disorder ([place])   . Schizophrenia ([place])   . Asthma   . Hypertension     Past Surgical History  Procedure Laterality Date  . Knee surgery     Family History: No family history on file. Family Psychiatric  History: unknown Social History:  History  Alcohol Use No    Comment: Pt stated that he rarely consumes alcohol     History  Drug Use No    Comment: Pt denies     Social History   Social History  . Marital Status: Single    Spouse Name: N/A  . Number of Children: N/A  . Years of Education: N/A   Social History Main Topics  . Smoking status: Current Some Day Smoker -- 0.50 packs/day for 10 years    Types: Cigarettes  . Smokeless tobacco: Never Used  . Alcohol Use: No     Comment: Pt stated that he rarely consumes alcohol  . Drug Use: No     Comment: Pt denies   . Sexual Activity: Yes    Birth Control/ Protection: None   Other Topics Concern  . None   Social History Narrative  Additional Social History:    Allergies:   Allergies  Allergen Reactions  . Vistaril [Hydroxyzine] Nausea Only    Severe stomach pain    Labs:  Results for orders placed or performed during the hospital encounter of 02/17/16 (from the past 48 hour(s))  Comprehensive metabolic panel     Status: Abnormal   Collection Time: 02/17/16  1:22 AM  Result Value Ref Range   Sodium 140 135 - 145 mmol/L   Potassium 3.0 (L) 3.5 - 5.1 mmol/L   Chloride 106 101 - 111 mmol/L   CO2  27 22 - 32 mmol/L   Glucose, Bld 115 (H) 65 - 99 mg/dL   BUN 11 6 - 20 mg/dL   Creatinine, Ser 1.23 0.61 - 1.24 mg/dL   Calcium 8.6 (L) 8.9 - 10.3 mg/dL   Total Protein 7.0 6.5 - 8.1 g/dL   Albumin 4.2 3.5 - 5.0 g/dL   AST 22 15 - 41 U/L   ALT 21 17 - 63 U/L   Alkaline Phosphatase 67 38 - 126 U/L   Total Bilirubin 0.6 0.3 - 1.2 mg/dL   GFR calc non Af Amer >60 >60 mL/min   GFR calc Af Amer >60 >60 mL/min    Comment: (NOTE) The eGFR has been calculated using the CKD EPI equation. This calculation has not been validated in all clinical situations. eGFR's persistently <60 mL/min signify possible Chronic Kidney Disease.    Anion gap 7 5 - 15  Ethanol     Status: None   Collection Time: 02/17/16  1:22 AM  Result Value Ref Range   Alcohol, Ethyl (B) <5 <5 mg/dL    Comment:        LOWEST DETECTABLE LIMIT FOR SERUM ALCOHOL IS 5 mg/dL FOR MEDICAL PURPOSES ONLY   Salicylate level     Status: None   Collection Time: 02/17/16  1:22 AM  Result Value Ref Range   Salicylate Lvl <1.7 2.8 - 30.0 mg/dL  Acetaminophen level     Status: Abnormal   Collection Time: 02/17/16  1:22 AM  Result Value Ref Range   Acetaminophen (Tylenol), Serum <10 (L) 10 - 30 ug/mL    Comment:        THERAPEUTIC CONCENTRATIONS VARY SIGNIFICANTLY. A RANGE OF 10-30 ug/mL MAY BE AN EFFECTIVE CONCENTRATION FOR MANY PATIENTS. HOWEVER, SOME ARE BEST TREATED AT CONCENTRATIONS OUTSIDE THIS RANGE. ACETAMINOPHEN CONCENTRATIONS >150 ug/mL AT 4 HOURS AFTER INGESTION AND >50 ug/mL AT 12 HOURS AFTER INGESTION ARE OFTEN ASSOCIATED WITH TOXIC REACTIONS.   cbc     Status: Abnormal   Collection Time: 02/17/16  1:22 AM  Result Value Ref Range   WBC 6.9 4.0 - 10.5 K/uL   RBC 4.29 4.22 - 5.81 MIL/uL   Hemoglobin 14.3 13.0 - 17.0 g/dL   HCT 40.5 39.0 - 52.0 %   MCV 94.4 78.0 - 100.0 fL   MCH 33.3 26.0 - 34.0 pg   MCHC 35.3 30.0 - 36.0 g/dL   RDW 13.0 11.5 - 15.5 %   Platelets 141 (L) 150 - 400 K/uL  Rapid urine drug  screen (hospital performed)     Status: None   Collection Time: 02/17/16  7:11 AM  Result Value Ref Range   Opiates NONE DETECTED NONE DETECTED   Cocaine NONE DETECTED NONE DETECTED   Benzodiazepines NONE DETECTED NONE DETECTED   Amphetamines NONE DETECTED NONE DETECTED   Tetrahydrocannabinol NONE DETECTED NONE DETECTED   Barbiturates NONE DETECTED NONE DETECTED    Comment:  DRUG SCREEN FOR MEDICAL PURPOSES ONLY.  IF CONFIRMATION IS NEEDED FOR ANY PURPOSE, NOTIFY LAB WITHIN 5 DAYS.        LOWEST DETECTABLE LIMITS FOR URINE DRUG SCREEN Drug Class       Cutoff (ng/mL) Amphetamine      1000 Barbiturate      200 Benzodiazepine   037 Tricyclics       048 Opiates          300 Cocaine          300 THC              50     Current Facility-Administered Medications  Medication Dose Route Frequency Provider Last Rate Last Dose  . alum & mag hydroxide-simeth (MAALOX/MYLANTA) 200-200-20 MG/5ML suspension 30 mL  30 mL Oral PRN Hannah Muthersbaugh, PA-C      . ibuprofen (ADVIL,MOTRIN) tablet 600 mg  600 mg Oral Q8H PRN Hannah Muthersbaugh, PA-C      . nicotine (NICODERM CQ - dosed in mg/24 hours) patch 21 mg  21 mg Transdermal Daily Hannah Muthersbaugh, PA-C   21 mg at 02/18/16 0951  . ondansetron (ZOFRAN) tablet 4 mg  4 mg Oral Q8H PRN Hannah Muthersbaugh, PA-C      . potassium chloride SA (K-DUR,KLOR-CON) CR tablet 40 mEq  40 mEq Oral Daily Hannah Muthersbaugh, PA-C   40 mEq at 02/18/16 0951  . QUEtiapine (SEROQUEL XR) 24 hr tablet 800 mg  800 mg Oral QHS Hannah Muthersbaugh, PA-C   800 mg at 02/17/16 2144  . zolpidem (AMBIEN) tablet 5 mg  5 mg Oral QHS PRN Abigail Butts, PA-C   5 mg at 02/17/16 2141   Current Outpatient Prescriptions  Medication Sig Dispense Refill  . fluticasone (FLONASE) 50 MCG/ACT nasal spray Place 2 sprays into the nose daily as needed for rhinitis or allergies.     . montelukast (SINGULAIR) 10 MG tablet Take 10 mg by mouth daily as needed. For allergies.   0  . QUEtiapine (SEROQUEL XR) 400 MG 24 hr tablet Take 800 mg by mouth at bedtime.      Musculoskeletal: Strength & Muscle Tone: within normal limits Gait & Station: normal Patient leans: N/A  Psychiatric Specialty Exam: Physical Exam  Constitutional: He is oriented to person, place, and time. He appears well-developed and well-nourished.  HENT:  Head: Normocephalic.  Respiratory: Effort normal.  Musculoskeletal: Normal range of motion.  Neurological: He is alert and oriented to person, place, and time.  Skin: Skin is warm and dry.    Review of Systems  Constitutional: Negative.   HENT: Negative.   Eyes: Negative.   Respiratory: Negative.   Cardiovascular: Negative.   Gastrointestinal: Negative.   Genitourinary: Negative.   Musculoskeletal: Negative.   Skin: Negative.   Neurological: Negative.   Endo/Heme/Allergies: Negative.   Psychiatric/Behavioral: Positive for hallucinations.    Blood pressure 111/63, pulse 59, temperature 97.6 F (36.4 C), temperature source Oral, resp. rate 18, SpO2 100 %.There is no weight on file to calculate BMI.  General Appearance: Casual  Eye Contact:  Good  Speech:  Normal Rate  Volume:  Normal  Mood:  Euthymic  Affect:  Congruent  Thought Process:  Coherent  Orientation:  Full (Time, Place, and Person)  Thought Content:  Hallucinations: Auditory Visual  Suicidal Thoughts:  No  Homicidal Thoughts:  No  Memory:  Immediate;   Good Recent;   Good Remote;   Good  Judgement:  Fair  Insight:  Fair  Psychomotor  Activity:  Normal  Concentration:  Concentration: Good and Attention Span: Good  Recall:  Good  Fund of Knowledge:  Good  Language:  Good  Akathisia:  No  Handed:  Right  AIMS (if indicated):     Assets:  Leisure Time Physical Health Resilience  ADL's:  Intact  Cognition:  WNL  Sleep:       Treatment Plan Summary: Daily contact with patient to assess and evaluate symptoms and progress in treatment, Medication  management and Plan schizoaffective disorder, bipolar type:  -Crisis stabilization -Medication management:  Continued his home medications including Seroquel 800 mg at bedtime for mood and psychosis -Individual counseling  Disposition: No evidence of imminent risk to self or others at present.    Ryan Boga, NP 02/18/2016 11:24 AM

## 2016-03-23 ENCOUNTER — Emergency Department (HOSPITAL_COMMUNITY)
Admission: EM | Admit: 2016-03-23 | Discharge: 2016-03-24 | Disposition: A | Discharge status: Home / Self Care | Payer: Medicaid Other | Attending: Emergency Medicine | Admitting: Emergency Medicine

## 2016-03-23 DIAGNOSIS — F250 Schizoaffective disorder, bipolar type: Secondary | ICD-10-CM | POA: Diagnosis not present

## 2016-03-23 DIAGNOSIS — Z0289 Encounter for other administrative examinations: Secondary | ICD-10-CM | POA: Diagnosis present

## 2016-03-23 DIAGNOSIS — I10 Essential (primary) hypertension: Secondary | ICD-10-CM | POA: Insufficient documentation

## 2016-03-23 DIAGNOSIS — J45909 Unspecified asthma, uncomplicated: Secondary | ICD-10-CM | POA: Diagnosis not present

## 2016-03-23 DIAGNOSIS — Z7951 Long term (current) use of inhaled steroids: Secondary | ICD-10-CM | POA: Insufficient documentation

## 2016-03-23 DIAGNOSIS — F17210 Nicotine dependence, cigarettes, uncomplicated: Secondary | ICD-10-CM | POA: Insufficient documentation
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
## 2016-03-24 ENCOUNTER — Encounter (HOSPITAL_COMMUNITY): Payer: Self-pay | Admitting: Emergency Medicine

## 2016-03-24 DIAGNOSIS — F250 Schizoaffective disorder, bipolar type: Secondary | ICD-10-CM

## 2016-03-24 LAB — SALICYLATE LEVEL: Salicylate Lvl: <4 mg/dL (ref 2.8–30.0)

## 2016-03-24 LAB — COMPREHENSIVE METABOLIC PANEL
ALK PHOS: 68 U/L (ref 38–126)
ALT: 21 U/L (ref 17–63)
ANION GAP: 5 (ref 5–15)
AST: 23 U/L (ref 15–41)
Albumin: 4.3 g/dL (ref 3.5–5.0)
BILIRUBIN TOTAL: 0.7 mg/dL (ref 0.3–1.2)
BUN: 10 mg/dL (ref 6–20)
CALCIUM: 8.6 mg/dL — AB (ref 8.9–10.3)
CO2: 30 mmol/L (ref 22–32)
Chloride: 105 mmol/L (ref 101–111)
Creatinine, Ser: 1.12 mg/dL (ref 0.61–1.24)
GLUCOSE: 100 mg/dL — AB (ref 65–99)
POTASSIUM: 3.2 mmol/L — AB (ref 3.5–5.1)
Sodium: 140 mmol/L (ref 135–145)
TOTAL PROTEIN: 7.2 g/dL (ref 6.5–8.1)

## 2016-03-24 LAB — RAPID URINE DRUG SCREEN, HOSP PERFORMED
AMPHETAMINES: NOT DETECTED
BARBITURATES: NOT DETECTED
BENZODIAZEPINES: NOT DETECTED
COCAINE: NOT DETECTED
Opiates: NOT DETECTED
TETRAHYDROCANNABINOL: NOT DETECTED

## 2016-03-24 LAB — CBC
HEMATOCRIT: 41.5 % (ref 39.0–52.0)
Hemoglobin: 14.7 g/dL (ref 13.0–17.0)
MCH: 33.6 pg (ref 26.0–34.0)
MCHC: 35.4 g/dL (ref 30.0–36.0)
MCV: 94.7 fL (ref 78.0–100.0)
Platelets: 139 10*3/uL — ABNORMAL LOW (ref 150–400)
RBC: 4.38 MIL/uL (ref 4.22–5.81)
RDW: 13 % (ref 11.5–15.5)
WBC: 7.2 10*3/uL (ref 4.0–10.5)

## 2016-03-24 LAB — ETHANOL

## 2016-03-24 LAB — ACETAMINOPHEN LEVEL

## 2016-03-24 MED ORDER — QUETIAPINE FUMARATE ER 400 MG PO TB24
800.0000 mg | ORAL_TABLET | Freq: Every day | ORAL | Status: DC
  Filled 2016-03-24 (×2): qty 2

## 2016-03-24 MED ORDER — ALUM & MAG HYDROXIDE-SIMETH 200-200-20 MG/5ML PO SUSP: 30.0000 mL | ORAL | Status: DC | PRN

## 2016-03-24 MED ORDER — ACETAMINOPHEN 325 MG PO TABS: 650.0000 mg | ORAL_TABLET | ORAL | Status: DC | PRN

## 2016-03-24 MED ORDER — MONTELUKAST SODIUM 10 MG PO TABS
10.0000 mg | ORAL_TABLET | Freq: Every day | ORAL | Status: DC
  Filled 2016-03-24 (×2): qty 1

## 2016-03-24 MED ORDER — LORAZEPAM 1 MG PO TABS: 1.0000 mg | ORAL_TABLET | Freq: Three times a day (TID) | ORAL | Status: DC | PRN

## 2016-03-24 MED ORDER — NICOTINE 21 MG/24HR TD PT24
21.0000 mg | MEDICATED_PATCH | Freq: Every day | TRANSDERMAL | Status: DC
  Administered 2016-03-24: 21 mg via TRANSDERMAL
  Filled 2016-03-24: qty 1

## 2016-03-24 MED ORDER — FLUTICASONE PROPIONATE 50 MCG/ACT NA SUSP
2.0000 | Freq: Every day | NASAL | Status: DC | PRN
  Filled 2016-03-24: qty 16

## 2016-03-24 MED ORDER — ONDANSETRON HCL 4 MG PO TABS: 4.0000 mg | ORAL_TABLET | Freq: Three times a day (TID) | ORAL | Status: DC | PRN

## 2016-03-24 MED ORDER — IBUPROFEN 200 MG PO TABS: 600.0000 mg | ORAL_TABLET | Freq: Three times a day (TID) | ORAL | Status: DC | PRN
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
## 2016-03-24 NOTE — ED Notes (Signed)
Report given to judy

## 2016-03-24 NOTE — BH Assessment (Signed)
Tele Assessment Note   Ryan York is an 45 y.o. male.  -Clinician reviewed note by Dr. Nicanor Alcon.  Patient has been having auditory hallucinations.  He says that he feels like people are acting crazy in the neighborhood where he is staying.  He feels like he has a "red dot" on his forehead.  He last recalls being hospitalized a month ago.  Patient looks disheveled.  [place]  Patient is currently homeless.  He says "it is loud and crazy over there."  He talks about wanting to move out to where it is quieter.  Patient says that he feels like it is tense in the area that he has been staying.  Patient says that he feels like people are pulling on him.  Patient clarifies that he feels like people are pulling on his face.  He hears voices yelling at him.  Patient says "I get a headache because of the voices."  Patient says voices tell him bad things and to do things.  Patient says he sees faces with the voices at times.  Patient denies SI, HI.  Patient denies using drugs.  He says he has not had ETOH or drugs in over 3 years.  Patient says he has ACTT team services from "[facility]."  Patient says he has been with them for 3 years but cannot remember their number.  Patient takes seroquel and says he has been taking it.  Patient has had some admissions to [facility] in the past.  -Clinician discussed patient care with Alberteen Sam, NP who recommends [facility] be contacted to assess if patient is at baseline with psychosis.  Discharge decision can be made after that.  Diagnosis: Schizoaffective d/o   Past Medical History:  Past Medical History  Diagnosis Date  . Bipolar disorder (HCC)   . Schizophrenia (HCC)   . Asthma   . Hypertension     Past Surgical History  Procedure Laterality Date  . Knee surgery      Family History: History reviewed. No pertinent family history.  Social History:  reports that he has been smoking Cigarettes.  He has a 5  pack-year smoking history. He has never used smokeless tobacco. He reports that he does not drink alcohol or use illicit drugs.  Additional Social History:  Alcohol / Drug Use Pain Medications: None Prescriptions: Seroquel Over the Counter: None History of alcohol / drug use?: No history of alcohol / drug abuse  CIWA: CIWA-Ar BP: 126/76 mmHg Pulse Rate: 90 COWS:    PATIENT STRENGTHS: (choose at least two) Average or above average intelligence Capable of independent living Communication skills  Allergies:  Allergies  Allergen Reactions  . Vistaril [Hydroxyzine] Nausea Only    Severe stomach pain    Home Medications:  (Not in a hospital admission)  OB/GYN Status:  No LMP for male patient.  General Assessment Data Location of Assessment: [facility] TTS Assessment: In system Is this a Tele or Face-to-Face Assessment?: Tele Assessment Is this an Initial Assessment or a Re-assessment for this encounter?: Initial Assessment Marital status: Single Is patient pregnant?: No Pregnancy Status: No Living Arrangements: Other (Comment) (Pt is homeless now.) Can pt return to current living arrangement?: Yes Admission Status: Voluntary Is patient capable of signing voluntary admission?: Yes Referral Source: Self/Family/Friend (Pt called police to get help coiming to [employer]) Insurance type: MCD     Crisis Care Plan Living Arrangements: Other (Comment) (Pt is homeless now.) Name  of Psychiatrist: [facility] Name of Therapist: [facility]  Education Status Is patient currently in school?: No Highest grade of school patient has completed: 12th grade  Risk to self with the past 6 months Suicidal Ideation: No Has patient been a risk to self within the past 6 months prior to admission? : No Suicidal Intent: No Has patient had any suicidal intent within the past 6 months prior to admission? : No Is patient at risk for suicide?: No Suicidal Plan?: No Has patient had any suicidal plan  within the past 6 months prior to admission? : No Access to Means: No What has been your use of drugs/alcohol within the last 12 months?: None Previous Attempts/Gestures: No How many times?: 0 Other Self Harm Risks: Pt denies Triggers for Past Attempts: None known Intentional Self Injurious Behavior: None Family Suicide History: No Recent stressful life event(s): Other (Comment) (Being homeless) Persecutory voices/beliefs?: Yes Depression: No Depression Symptoms:  (Pt denies depressive symptoms.) Substance abuse history and/or treatment for substance abuse?: No Suicide prevention information given to non-admitted patients: Not applicable  Risk to Others within the past 6 months Homicidal Ideation: No Does patient have any lifetime risk of violence toward others beyond the six months prior to admission? : No Thoughts of Harm to Others: No Current Homicidal Intent: No Current Homicidal Plan: No Access to Homicidal Means: No Identified Victim: No one History of harm to others?: No Assessment of Violence: None Noted Violent Behavior Description: None reported Does patient have access to weapons?: No Criminal Charges Pending?: No Does patient have a court date: No Is patient on probation?: No  Psychosis Hallucinations: Auditory, Tactile, Visual (Screaming voices; feels his face being touched) Delusions: Persecutory  Mental Status Report Appearance/Hygiene: Disheveled, Body odor, In scrubs Eye Contact: Fair Motor Activity: Freedom of movement, Unremarkable Speech: Loud Level of Consciousness: Quiet/awake Mood: Depressed, Anxious, Apprehensive, Despair, Helpless, Sad Affect: Appropriate to circumstance Anxiety Level: Moderate Thought Processes: Coherent, Relevant Judgement: Unimpaired Orientation: Appropriate for developmental age Obsessive Compulsive Thoughts/Behaviors: None  Cognitive Functioning Concentration: Normal Memory: Remote Intact, Recent Impaired IQ:  Average Insight: Poor Impulse Control: Fair Appetite: Good Weight Loss: 0 Weight Gain: 0 Sleep: No Change Total Hours of Sleep: 8 Vegetative Symptoms: None  ADLScreening [facility] Assessment Services) Patient's cognitive ability adequate to safely complete daily activities?: Yes Patient able to express need for assistance with ADLs?: Yes Independently performs ADLs?: Yes (appropriate for developmental age)  Prior Inpatient Therapy Prior Inpatient Therapy: Yes Prior Therapy Dates: 2010, 2012, 2013 [facility]  Reason for Treatment: Schizoaffective disorder   Prior Outpatient Therapy Prior Outpatient Therapy: Yes Prior Therapy Dates: Last 3 years Prior Therapy Facilty/Provider(s): [facility] Reason for Treatment: mental halth Does patient have an ACCT team?: Yes ([facility]) Does patient have Intensive In-House Services?  : No Does patient have [facility] services? : No Does patient have P4CC services?: No  ADL Screening (condition at time of admission) Patient's cognitive ability adequate to safely complete daily activities?: Yes Is the patient deaf or have difficulty hearing?: No Does the patient have difficulty seeing, even when wearing glasses/contacts?: No Does the patient have difficulty concentrating, remembering, or making decisions?: Yes Patient able to express need for assistance with ADLs?: Yes Does the patient have difficulty dressing or bathing?: No Independently performs ADLs?: Yes (appropriate for developmental age) Does the patient have difficulty walking or climbing stairs?: No Weakness of Legs: None Weakness of Arms/Hands: None       Abuse/Neglect Assessment (  Assessment to be complete while patient is alone) Physical Abuse: Denies Verbal Abuse: Denies Sexual Abuse: Denies Exploitation of patient/patient's resources: Denies Self-Neglect: Denies     Merchant navy officerAdvance Directives (For Healthcare) Does patient have an advance  directive?: No Would patient like information on creating an advanced directive?: No - patient declined information    Additional Information 1:1 In Past 12 Months?: No CIRT Risk: No Elopement Risk: No Does patient have medical clearance?: Yes     Disposition:  Disposition Initial Assessment Completed for this Encounter: Yes Other disposition(s): Other (Comment) (Pt to be run by NP)  Alexandria LodgeHarvey, Gershon Shorten Ray 03/24/2016 6:39 AM

## 2016-03-24 NOTE — Consult Note (Signed)
[facility] Psychiatry Consult   Reason for Consult:  Hallucinations  Referring Physician:  EDP Patient Identification: Ryan York MRN:  [id number] Principal Diagnosis: Schizoaffective disorder, bipolar type [facility]) Diagnosis:   Patient Active Problem List   Diagnosis Date Noted  . Schizoaffective disorder, bipolar type ([place]) [F25.0] 04/20/2012    Priority: High    Total Time spent with patient: 45 minutes  Subjective:   Ryan York is a 45 y.o. male patient does not warrant admission.  HPI:  45 yo male who presented to the ED with vague hallucinations.  On assessment, he continues to deny suicidal/homicidal ideations and alcohol/drug abuse.  He is no longer hallucinating but "needs to stay a couple of day because I am out of money until the first of the month."  He does not meet inpatient criteria.  His ACT team will be notified and he will be discharged.  Past Psychiatric History: schizoaffective disorder, bipolar type  Risk to Self: Suicidal Ideation: No Suicidal Intent: No Is patient at risk for suicide?: No Suicidal Plan?: No Access to Means: No What has been your use of drugs/alcohol within the last 12 months?: None How many times?: 0 Other Self Harm Risks: Pt denies Triggers for Past Attempts: None known Intentional Self Injurious Behavior: None Risk to Others: Homicidal Ideation: No Thoughts of Harm to Others: No Current Homicidal Intent: No Current Homicidal Plan: No Access to Homicidal Means: No Identified Victim: No one History of harm to others?: No Assessment of Violence: None Noted Violent Behavior Description: None reported Does patient have access to weapons?: No Criminal Charges Pending?: No Does patient have a court date: No Prior Inpatient Therapy: Prior Inpatient Therapy: Yes Prior Therapy Dates: 2010, 2012, 2013 Prior Therapy Facilty/Provider([facility]): [facility]  Reason for Treatment: Schizoaffective disorder  Prior Outpatient Therapy:  Prior Outpatient Therapy: Yes Prior Therapy Dates: Last 3 years Prior Therapy Facilty/Provider(s): [facility] Reason for Treatment: mental halth Does patient have an ACCT team?: Yes ([facility]) Does patient have Intensive In-House Services?  : No Does patient have [facility] services? : No Does patient have P4CC services?: No  Past Medical History:  Past Medical History  Diagnosis Date  . Bipolar disorder ([place])   . Schizophrenia ([place])   . Asthma   . Hypertension     Past Surgical History  Procedure Laterality Date  . Knee surgery     Family History: History reviewed. No pertinent family history. Family Psychiatric  History: none Social History:  History  Alcohol Use No    Comment: Pt stated that he rarely consumes alcohol     History  Drug Use No    Comment: Pt denies     Social History   Social History  . Marital Status: Single    Spouse Name: N/A  . Number of Children: N/A  . Years of Education: N/A   Social History Main Topics  . Smoking status: Current Some Day Smoker -- 0.50 packs/day for 10 years    Types: Cigarettes  . Smokeless tobacco: Never Used  . Alcohol Use: No     Comment: Pt stated that he rarely consumes alcohol  . Drug Use: No     Comment: Pt denies   . Sexual Activity: Yes    Birth Control/ Protection: None   Other Topics Concern  . None   Social History Narrative   Additional Social History:    Allergies:   Allergies  Allergen Reactions  . Vistaril [Hydroxyzine] Nausea Only  Severe stomach pain    Labs:  Results for orders placed or performed during the hospital encounter of 03/23/16 (from the past 48 hour(s))  Rapid urine drug screen (hospital performed)     Status: None   Collection Time: 03/24/16 12:14 AM  Result Value Ref Range   Opiates NONE DETECTED NONE DETECTED   Cocaine NONE DETECTED NONE DETECTED   Benzodiazepines NONE DETECTED NONE DETECTED   Amphetamines NONE DETECTED NONE DETECTED   Tetrahydrocannabinol  NONE DETECTED NONE DETECTED   Barbiturates NONE DETECTED NONE DETECTED    Comment:        DRUG SCREEN FOR MEDICAL PURPOSES ONLY.  IF CONFIRMATION IS NEEDED FOR ANY PURPOSE, NOTIFY LAB WITHIN 5 DAYS.        LOWEST DETECTABLE LIMITS FOR URINE DRUG SCREEN Drug Class       Cutoff (ng/mL) Amphetamine      1000 Barbiturate      200 Benzodiazepine   132 Tricyclics       440 Opiates          300 Cocaine          300 THC              50   Comprehensive metabolic panel     Status: Abnormal   Collection Time: 03/24/16 12:20 AM  Result Value Ref Range   Sodium 140 135 - 145 mmol/L   Potassium 3.2 (L) 3.5 - 5.1 mmol/L   Chloride 105 101 - 111 mmol/L   CO2 30 22 - 32 mmol/L   Glucose, Bld 100 (H) 65 - 99 mg/dL   BUN 10 6 - 20 mg/dL   Creatinine, Ser 1.12 0.61 - 1.24 mg/dL   Calcium 8.6 (L) 8.9 - 10.3 mg/dL   Total Protein 7.2 6.5 - 8.1 g/dL   Albumin 4.3 3.5 - 5.0 g/dL   AST 23 15 - 41 U/L   ALT 21 17 - 63 U/L   Alkaline Phosphatase 68 38 - 126 U/L   Total Bilirubin 0.7 0.3 - 1.2 mg/dL   GFR calc non Af Amer >60 >60 mL/min   GFR calc Af Amer >60 >60 mL/min    Comment: (NOTE) The eGFR has been calculated using the CKD EPI equation. This calculation has not been validated in all clinical situations. eGFR's persistently <60 mL/min signify possible Chronic Kidney Disease.    Anion gap 5 5 - 15  Ethanol     Status: None   Collection Time: 03/24/16 12:20 AM  Result Value Ref Range   Alcohol, Ethyl (B) <5 <5 mg/dL    Comment:        LOWEST DETECTABLE LIMIT FOR SERUM ALCOHOL IS 5 mg/dL FOR MEDICAL PURPOSES ONLY   Salicylate level     Status: None   Collection Time: 03/24/16 12:20 AM  Result Value Ref Range   Salicylate Lvl <1.0 2.8 - 30.0 mg/dL  Acetaminophen level     Status: Abnormal   Collection Time: 03/24/16 12:20 AM  Result Value Ref Range   Acetaminophen (Tylenol), Serum <10 (L) 10 - 30 ug/mL    Comment:        THERAPEUTIC CONCENTRATIONS VARY SIGNIFICANTLY. A RANGE OF  10-30 ug/mL MAY BE AN EFFECTIVE CONCENTRATION FOR MANY PATIENTS. HOWEVER, SOME ARE BEST TREATED AT CONCENTRATIONS OUTSIDE THIS RANGE. ACETAMINOPHEN CONCENTRATIONS >150 ug/mL AT 4 HOURS AFTER INGESTION AND >50 ug/mL AT 12 HOURS AFTER INGESTION ARE OFTEN ASSOCIATED WITH TOXIC REACTIONS.   cbc     Status:  Abnormal   Collection Time: 03/24/16 12:20 AM  Result Value Ref Range   WBC 7.2 4.0 - 10.5 K/uL   RBC 4.38 4.22 - 5.81 MIL/uL   Hemoglobin 14.7 13.0 - 17.0 g/dL   HCT 41.5 39.0 - 52.0 %   MCV 94.7 78.0 - 100.0 fL   MCH 33.6 26.0 - 34.0 pg   MCHC 35.4 30.0 - 36.0 g/dL   RDW 13.0 11.5 - 15.5 %   Platelets 139 (L) 150 - 400 K/uL    Current Facility-Administered Medications  Medication Dose Route Frequency Provider Last Rate Last Dose  . acetaminophen (TYLENOL) tablet 650 mg  650 mg Oral Q4H PRN April Palumbo, MD      . alum & mag hydroxide-simeth (MAALOX/MYLANTA) 200-200-20 MG/5ML suspension 30 mL  30 mL Oral PRN April Palumbo, MD      . fluticasone [facility]) 50 MCG/ACT nasal spray 2 spray  2 spray Each Nare Daily PRN April Palumbo, MD      . ibuprofen (ADVIL,MOTRIN) tablet 600 mg  600 mg Oral Q8H PRN April Palumbo, MD      . LORazepam (ATIVAN) tablet 1 mg  1 mg Oral Q8H PRN April Palumbo, MD      . montelukast (SINGULAIR) tablet 10 mg  10 mg Oral QHS April Palumbo, MD   10 mg at 03/24/16 0135  . nicotine (NICODERM CQ - dosed in mg/24 hours) patch 21 mg  21 mg Transdermal Daily April Palumbo, MD   21 mg at 03/24/16 8527  . ondansetron (ZOFRAN) tablet 4 mg  4 mg Oral Q8H PRN April Palumbo, MD      . QUEtiapine (SEROQUEL XR) 24 hr tablet 800 mg  800 mg Oral QHS April Palumbo, MD   800 mg at 03/24/16 7824   Current Outpatient Prescriptions  Medication Sig Dispense Refill  . fluticasone (FLONASE) 50 MCG/ACT nasal spray Place 2 sprays into the nose daily as needed for rhinitis or allergies.     . montelukast (SINGULAIR) 10 MG tablet Take 10 mg by mouth daily as needed. For allergies.   0  . QUEtiapine (SEROQUEL XR) 400 MG 24 hr tablet Take 200-400 mg by mouth at bedtime.       Musculoskeletal: Strength & Muscle Tone: within normal limits Gait & Station: normal Patient leans: N/A  Psychiatric Specialty Exam: Physical Exam  Constitutional: He is oriented to person, place, and time. He appears well-developed.  HENT:  Head: Normocephalic.  Neck: Normal range of motion.  Respiratory: Effort normal.  Musculoskeletal: Normal range of motion.  Neurological: He is alert and oriented to person, place, and time.  Skin: Skin is warm and dry.  Psychiatric: He has a normal mood and affect. His speech is normal and behavior is normal. Judgment and thought content normal. Cognition and memory are normal.    Review of Systems  Constitutional: Negative.   HENT: Negative.   Eyes: Negative.   Respiratory: Negative.   Cardiovascular: Negative.   Gastrointestinal: Negative.   Genitourinary: Negative.   Musculoskeletal: Negative.   Skin: Negative.   Neurological: Negative.   Endo/Heme/Allergies: Negative.   Psychiatric/Behavioral: Negative.     Blood pressure 126/76, pulse 90, temperature 98.1 F (36.7 C), temperature source Oral, resp. rate 20, SpO2 100 %.There is no weight on file to calculate BMI.  General Appearance: Casual  Eye Contact:  Good  Speech:  Normal Rate  Volume:  Normal  Mood:  Euthymic  Affect:  Congruent  Thought Process:  Coherent and Descriptions  of Associations: Intact  Orientation:  Full (Time, Place, and Person)  Thought Content:  WDL  Suicidal Thoughts:  No  Homicidal Thoughts:  No  Memory:  Immediate;   Good Recent;   Good Remote;   Good  Judgement:  Fair  Insight:  Fair  Psychomotor Activity:  Normal  Concentration:  Concentration: Good and Attention Span: Good  Recall:  Good  Fund of Knowledge:  Good  Language:  Good  Akathisia:  No  Handed:  Right  AIMS (if indicated):     Assets:  Leisure Time Physical Health Resilience Social  Support  ADL's:  Intact  Cognition:  WNL  Sleep:        Treatment Plan Summary: Daily contact with patient to assess and evaluate symptoms and progress in treatment, Medication management and Plan schizoaffective disorder, bipolar type:  -Crisis stabilization -Medication management:  Restarted medical medications and Seroquel 800 mg at bedtime for sleep and schizoaffective disorder -Individual counseling  Disposition: No evidence of imminent risk to self or others at present.    Waylan Boga, NP 03/24/2016 9:52 AM Patient seen face-to-face for psychiatric evaluation, chart reviewed and case discussed with the physician extender and developed treatment plan. Reviewed the information documented and agree with the treatment plan. Corena Pilgrim, MD

## 2016-03-24 NOTE — ED Notes (Signed)
Patient wanded by security. 

## 2016-03-24 NOTE — Discharge Instructions (Signed)
For your ongoing behavioral health needs, you are advised to continue treatment with [facility]:       [facility]      [address]      [place], [place] [address]      [phone]

## 2016-03-24 NOTE — [facility] Suicide Risk Assessment (Signed)
Suicide Risk Assessment  Discharge Assessment   [facility]   Principal Problem: Schizoaffective disorder, bipolar type [facility](HCC) Discharge Diagnoses:  Patient Active Problem List   Diagnosis Date Noted  . Schizoaffective disorder, bipolar type (HCC) [F25.0] 04/20/2012    Priority: High    Total Time spent with patient: 45 minutes  Musculoskeletal: Strength & Muscle Tone: within normal limits Gait & Station: normal Patient leans: N/A  Psychiatric Specialty Exam: Physical Exam  Constitutional: He is oriented to person, place, and time. He appears well-developed.  HENT:  Head: Normocephalic.  Neck: Normal range of motion.  Respiratory: Effort normal.  Musculoskeletal: Normal range of motion.  Neurological: He is alert and oriented to person, place, and time.  Skin: Skin is warm and dry.  Psychiatric: He has a normal mood and affect. His speech is normal and behavior is normal. Judgment and thought content normal. Cognition and memory are normal.    Review of Systems  Constitutional: Negative.   HENT: Negative.   Eyes: Negative.   Respiratory: Negative.   Cardiovascular: Negative.   Gastrointestinal: Negative.   Genitourinary: Negative.   Musculoskeletal: Negative.   Skin: Negative.   Neurological: Negative.   Endo/Heme/Allergies: Negative.   Psychiatric/Behavioral: Negative.     Blood pressure 126/76, pulse 90, temperature 98.1 F (36.7 C), temperature source Oral, resp. rate 20, SpO2 100 %.There is no weight on file to calculate BMI.  General Appearance: Casual  Eye Contact:  Good  Speech:  Normal Rate  Volume:  Normal  Mood:  Euthymic  Affect:  Congruent  Thought Process:  Coherent and Descriptions of Associations: Intact  Orientation:  Full (Time, Place, and Person)  Thought Content:  WDL  Suicidal Thoughts:  No  Homicidal Thoughts:  No  Memory:  Immediate;   Good Recent;   Good Remote;   Good  Judgement:  Fair  Insight:  Fair   Psychomotor Activity:  Normal  Concentration:  Concentration: Good and Attention Span: Good  Recall:  Good  Fund of Knowledge:  Good  Language:  Good  Akathisia:  No  Handed:  Right  AIMS (if indicated):     Assets:  Leisure Time Physical Health Resilience Social Support  ADL's:  Intact  Cognition:  WNL  Sleep:       Mental Status Per Nursing Assessment::   On Admission:   hallucinations  Demographic Factors:  Male  Loss Factors: NA  Historical Factors: NA  Risk Reduction Factors:   Sense of responsibility to family, Positive social support and Positive therapeutic relationship  Continued Clinical Symptoms:  None  Cognitive Features That Contribute To Risk:  None    Suicide Risk:  Minimal: No identifiable suicidal ideation.  Patients presenting with no risk factors but with morbid ruminations; may be classified as minimal risk based on the severity of the depressive symptoms    Plan Of Care/Follow-up recommendations:  Activity:  as tolerated Diet:  heart healthy diet  Peggi Yono, NP 03/24/2016, 10:06 AM

## 2016-03-24 NOTE — BH Assessment (Signed)
[facility] Assessment Progress Note  Per Thedore MinsMojeed Akintayo, MD, this pt does not require psychiatric hospitalization at this time.  [facility]  This Clinical research associatewriter was asked to call his outpatient provider, [facility], to facilitate discharge and follow up.  At 10:35 I called [facility] and spoke to Baker Miquela Costabile IncorporatedSimone Wheeler.  She reports that they only provide transportation for patients when they are taking them to the clinic for an appointment.  Pt receives routine outpatient services from them (not [facility] Team or [facility]) but he does not currently have an appointment.  This was staffed with Dr Jannifer FranklinAkintayo and with Nanine MeansJamison Lord, DNP, and it was decided to discharge pt with recommendation to follow up with [facility].  [facility]'s contact information has been include in pt's discharge instructions.  Pt's nurse, Morrie Sheldonshley, has been notified.  Doylene Canninghomas Shawntrice Salle, MA Triage Specialist [phone]

## 2016-03-24 NOTE — ED Notes (Signed)
Patient noted in room. No complaints, stable, in no acute distress. Q15 minute rounds and monitoring via security cameras continue for safety. 

## 2016-03-24 NOTE — ED Notes (Signed)
Pt discharged home per MD order. Pt denies SI/HI. Discharge summary reviewed with pt. Pt verbalizes understanding. Pt signed for personal belongings, personal belongings returned, medication from pharmacy signed for and returned to pt. Pt signed e-signature. Ambulatory off unit.

## 2016-04-01 ENCOUNTER — Emergency Department (HOSPITAL_COMMUNITY)
Admission: EM | Admit: 2016-04-01 | Discharge: 2016-04-02 | Disposition: A | Discharge status: Home / Self Care | Payer: Medicaid Other | Attending: Emergency Medicine | Admitting: Emergency Medicine

## 2016-04-01 ENCOUNTER — Encounter (HOSPITAL_COMMUNITY): Payer: Self-pay | Admitting: *Deleted

## 2016-04-01 DIAGNOSIS — I10 Essential (primary) hypertension: Secondary | ICD-10-CM | POA: Insufficient documentation

## 2016-04-01 DIAGNOSIS — F17210 Nicotine dependence, cigarettes, uncomplicated: Secondary | ICD-10-CM | POA: Insufficient documentation

## 2016-04-01 DIAGNOSIS — F329 Major depressive disorder, single episode, unspecified: Secondary | ICD-10-CM | POA: Insufficient documentation

## 2016-04-01 DIAGNOSIS — R443 Hallucinations, unspecified: Secondary | ICD-10-CM | POA: Diagnosis present

## 2016-04-01 DIAGNOSIS — R440 Auditory hallucinations: Secondary | ICD-10-CM | POA: Diagnosis not present

## 2016-04-01 DIAGNOSIS — Z79899 Other long term (current) drug therapy: Secondary | ICD-10-CM | POA: Diagnosis not present

## 2016-04-01 DIAGNOSIS — J45909 Unspecified asthma, uncomplicated: Secondary | ICD-10-CM | POA: Diagnosis not present

## 2016-04-01 NOTE — ED Notes (Signed)
Pt states that he is tired of hearing voices and wants them to stop; pt then states that they are not really voice but people using telepathy; pt denies SI / HI; pt states that he is homeless and the voices get worse around cell towers; pt states that he has been seen several times in the past for same thing

## 2016-04-01 NOTE — ED Notes (Signed)
Pt and belongings wanded by security. Pt placed into scrubs. Belongings consist of brown and cream striped shirt, jeans, tan and brown fabric belt, black socks, black tennis shoes, and a black book bag containing gloves in the side pocket and a brush inside.

## 2016-04-02 LAB — CBC
HCT: 41.1 % (ref 39.0–52.0)
HEMOGLOBIN: 14.5 g/dL (ref 13.0–17.0)
MCH: 33.5 pg (ref 26.0–34.0)
MCHC: 35.3 g/dL (ref 30.0–36.0)
MCV: 94.9 fL (ref 78.0–100.0)
PLATELETS: 132 10*3/uL — AB (ref 150–400)
RBC: 4.33 MIL/uL (ref 4.22–5.81)
RDW: 12.8 % (ref 11.5–15.5)
WBC: 6 10*3/uL (ref 4.0–10.5)

## 2016-04-02 LAB — COMPREHENSIVE METABOLIC PANEL
ALBUMIN: 4.4 g/dL (ref 3.5–5.0)
ALT: 19 U/L (ref 17–63)
AST: 22 U/L (ref 15–41)
Alkaline Phosphatase: 75 U/L (ref 38–126)
Anion gap: 6 (ref 5–15)
BILIRUBIN TOTAL: 0.2 mg/dL — AB (ref 0.3–1.2)
BUN: 15 mg/dL (ref 6–20)
CHLORIDE: 107 mmol/L (ref 101–111)
CO2: 25 mmol/L (ref 22–32)
CREATININE: 1.12 mg/dL (ref 0.61–1.24)
Calcium: 9 mg/dL (ref 8.9–10.3)
GFR calc Af Amer: >60 mL/min (ref >60)
GFR calc non Af Amer: >60 mL/min (ref >60)
Glucose, Bld: 105 mg/dL — ABNORMAL HIGH (ref 65–99)
POTASSIUM: 3.8 mmol/L (ref 3.5–5.1)
Sodium: 138 mmol/L (ref 135–145)
Total Protein: 7.6 g/dL (ref 6.5–8.1)

## 2016-04-02 LAB — RAPID URINE DRUG SCREEN, HOSP PERFORMED
AMPHETAMINES: NOT DETECTED
BENZODIAZEPINES: NOT DETECTED
Barbiturates: NOT DETECTED
Cocaine: NOT DETECTED
OPIATES: NOT DETECTED
TETRAHYDROCANNABINOL: NOT DETECTED

## 2016-04-02 LAB — SALICYLATE LEVEL

## 2016-04-02 LAB — ACETAMINOPHEN LEVEL: Acetaminophen (Tylenol), Serum: <10 ug/mL — ABNORMAL LOW (ref 10–30)

## 2016-04-02 LAB — ETHANOL: Alcohol, Ethyl (B): <5 mg/dL (ref <5)

## 2016-04-02 MED ORDER — OLANZAPINE 2.5 MG PO TABS: 2.5000 mg | ORAL_TABLET | Freq: Every day | ORAL | Status: DC

## 2016-04-02 MED ORDER — OLANZAPINE 2.5 MG PO TABS
2.5000 mg | ORAL_TABLET | Freq: Once | ORAL | Status: DC
  Filled 2016-04-02: qty 1

## 2016-04-02 NOTE — ED Notes (Signed)
Pt refused Zyprexa and pt refused to sign discharge instructions

## 2016-04-02 NOTE — Discharge Instructions (Signed)
Schizophrenia Mr. Deadwyler, take medication as directed and see your regular doctor within 3 days for close follow up. If symptoms worsen, come back to the ED immediately. Thank you. Schizophrenia is a mental illness. It may cause disturbed or disorganized thinking, speech, or behavior. People with schizophrenia have problems functioning in one or more areas of life: work, school, home, or relationships. People with schizophrenia are at increased risk for suicide, certain chronic physical illnesses, and unhealthy behaviors, such as smoking and drug use. People who have family members with schizophrenia are at higher risk of developing the illness. Schizophrenia affects men and women equally but usually appears at an earlier age (teenage or early adult years) in men.  SYMPTOMS The earliest symptoms are often subtle (prodrome) and may go unnoticed until the illness becomes more severe (first-break psychosis). Symptoms of schizophrenia may be continuous or may come and go in severity. Episodes often are triggered by major life events, such as family stress, college, [employer] service, marriage, pregnancy or child birth, divorce, or loss of a loved one. People with schizophrenia may see, hear, or feel things that do not exist (hallucinations). They may have false beliefs in spite of obvious proof to the contrary (delusions). Sometimes speech is incoherent or behavior is odd or withdrawn.  DIAGNOSIS Schizophrenia is diagnosed through an assessment by your caregiver. Your caregiver will ask questions about your thoughts, behavior, mood, and ability to function in daily life. Your caregiver may ask questions about your medical history and use of alcohol or drugs, including prescription medication. Your caregiver may also order blood tests and imaging exams. Certain medical conditions and substances can cause symptoms that resemble schizophrenia. Your caregiver may refer you to a mental health specialist for evaluation.  There are three major criterion for a diagnosis of schizophrenia:  Two or more of the following five symptoms are present for a month or longer:  Delusions. Often the delusions are that you are being attacked, harassed, cheated, persecuted or conspired against (persecutory delusions).  Hallucinations.   Disorganized speech that does not make sense to others.  Grossly disorganized (confused or unfocused) behavior or extremely overactive or underactive motor activity (catatonia).  Negative symptoms such as bland or blunted emotions (flat affect), loss of will power (avolition), and withdrawal from social contacts (social isolation).  Level of functioning in one or more major areas of life (work, school, relationships, or self-care) is markedly below the level of functioning before the onset of illness.   There are continuous signs of illness (either mild symptoms or decreased level of functioning) for at least 6 months or longer. TREATMENT  Schizophrenia is a long-term illness. It is best controlled with continuous treatment rather than treatment only when symptoms occur. The following treatments are used to manage schizophrenia:  Medication--Medication is the most effective and important form of treatment for schizophrenia. Antipsychotic medications are usually prescribed to help manage schizophrenia. Other types of medication may be added to relieve any symptoms that may occur despite the use of antipsychotic medications.  Counseling or talk therapy--Individual, group, or family counseling may be helpful in providing education, support, and guidance. Many people with schizophrenia also benefit from social skills and job skills (vocational) training. A combination of medication and counseling is best for managing the disorder over time. A procedure in which electricity is applied to the brain through the scalp (electroconvulsive therapy) may be used to treat catatonic schizophrenia or  schizophrenia in people who cannot take or do not respond to medication  and counseling.   This information is not intended to replace advice given to you by your health care provider. Make sure you discuss any questions you have with your health care provider.   Document Released: 09/12/2000 Document Revised: 10/06/2014 Document Reviewed: 12/08/2012 Elsevier Interactive Patient Education [employer]2016 Elsevier Inc.

## 2016-04-02 NOTE — ED Provider Notes (Signed)
CSN: [id number]     Arrival date & time 04/01/16  2316 History  By signing my name below, I, Ryan York, attest that this documentation has been prepared under the direction and in the presence of Tomasita CrumbleAdeleke Burnett Lieber, MD . Electronically Signed: Majel HomerPeyton York, Scribe. 04/02/2016. 12:29 AM.   Chief Complaint  Patient presents with  . Hallucinations   The history is provided by the patient. No language interpreter was used.   HPI Comments: Ryan York is a 45 y.o. male with PMHx of HTN, bipolar disorder, and schizophrenia, who presents to the Emergency Department complaining of gradually worsening, auditory hallucinations that began in 1995 but worsened tonight. Pt notes he is tired of hearing voices. He reports the voices are "talking trash;" he states the voices get worse when he is around cell towers. Pt states he currently takes Seroquel; he states he was on Vistaril but was taken off because it caused his penis "to shrink." He notes he is hesitant to try other medications due to this side effect. Pt states he is currently being followed by a therapist and psychiatrist. Pt denies SI, HI, and recent illicit drug or alcohol use. He also denies recent sickness, coughing, rhinorrhea, fever, vomiting, and diarrhea. Pt reports he is currently homeless.   Past Medical History  Diagnosis Date  . Bipolar disorder (HCC)   . Schizophrenia (HCC)   . Asthma   . Hypertension    Past Surgical History  Procedure Laterality Date  . Knee surgery     No family history on file. Social History  Substance Use Topics  . Smoking status: Current Some Day Smoker -- 0.50 packs/day for 10 years    Types: Cigarettes, Cigars  . Smokeless tobacco: Never Used  . Alcohol Use: No     Comment: Pt stated that he rarely consumes alcohol    Review of Systems  Constitutional: Negative for fever.  HENT: Negative for rhinorrhea.   Respiratory: Negative for cough.   Gastrointestinal: Negative for vomiting and diarrhea.   Psychiatric/Behavioral: Positive for hallucinations.    Allergies  Vistaril  Home Medications   Prior to Admission medications   Medication Sig Start Date End Date Taking? Authorizing Provider  fluticasone (FLONASE) 50 MCG/ACT nasal spray Place 2 sprays into the nose daily as needed for rhinitis or allergies.    Yes Historical Provider, MD  montelukast (SINGULAIR) 10 MG tablet Take 10 mg by mouth daily as needed. For allergies. 01/07/16  Yes Historical Provider, MD  QUEtiapine (SEROQUEL XR) 400 MG 24 hr tablet Take 200-400 mg by mouth at bedtime.    Yes Historical Provider, MD   BP 136/88 mmHg  Pulse 78  Temp(Src) 98 F (36.7 C) (Oral)  Resp 18  SpO2 99% Physical Exam  Constitutional: He is oriented to person, place, and time. Vital signs are normal. He appears well-developed and well-nourished.  Non-toxic appearance. He does not appear ill. No distress.  HENT:  Head: Normocephalic and atraumatic.  Nose: Nose normal.  Mouth/Throat: Oropharynx is clear and moist. No oropharyngeal exudate.  Eyes: Conjunctivae and EOM are normal. Pupils are equal, round, and reactive to light. No scleral icterus.  Neck: Normal range of motion. Neck supple. No tracheal deviation, no edema, no erythema and normal range of motion present. No thyroid mass and no thyromegaly present.  Cardiovascular: Normal rate, regular rhythm, S1 normal, S2 normal, normal heart sounds, intact distal pulses and normal pulses.  Exam reveals no gallop and no friction rub.   No  murmur heard. Pulmonary/Chest: Effort normal and breath sounds normal. No respiratory distress. He has no wheezes. He has no rhonchi. He has no rales.  Abdominal: Soft. Normal appearance and bowel sounds are normal. He exhibits no distension, no ascites and no mass. There is no hepatosplenomegaly. There is no tenderness. There is no rebound, no guarding and no CVA tenderness.  Musculoskeletal: Normal range of motion. He exhibits no edema or tenderness.   Lymphadenopathy:    He has no cervical adenopathy.  Neurological: He is alert and oriented to person, place, and time. He has normal strength. No cranial nerve deficit or sensory deficit.  Skin: Skin is warm, dry and intact. No petechiae and no rash noted. He is not diaphoretic. No erythema. No pallor.  Psychiatric: He has a normal mood and affect. His behavior is normal. Judgment normal.  No SI/HI  Nursing note and vitals reviewed.   ED Course  Procedures  DIAGNOSTIC STUDIES:  Oxygen Saturation is 99% on RA, normal by my interpretation.    COORDINATION OF CARE:  12:29 AM Discussed treatment plan with pt at bedside and pt agreed to plan.  Labs Review Labs Reviewed  COMPREHENSIVE METABOLIC PANEL - Abnormal; Notable for the following:    Glucose, Bld 105 (*)    Total Bilirubin 0.2 (*)    All other components within normal limits  ACETAMINOPHEN LEVEL - Abnormal; Notable for the following:    Acetaminophen (Tylenol), Serum <10 (*)    All other components within normal limits  CBC - Abnormal; Notable for the following:    Platelets 132 (*)    All other components within normal limits  ETHANOL  SALICYLATE LEVEL  URINE RAPID DRUG SCREEN, HOSP PERFORMED    Imaging Review No results found. I have personally reviewed and evaluated these images and lab results as part of my medical decision-making.   EKG Interpretation None      MDM   Final diagnoses:  None    Patient presents to the ED for auditory hallucinations.  He denies SI/HI.  He wants to try an alternative medication for this.  Will start him on low dose olanzepine and advise follow up within 3 days.  Labs ordered by triage and not clinically indicated. They were normal. He appears well and in NAD. VS remain within his normal limits and he is safe for DC.   I personally performed the services described in this documentation, which was scribed in my presence. The recorded information has been reviewed and is  accurate.      Tomasita CrumbleAdeleke Toshie Demelo, MD 04/02/16 [phone]

## 2016-04-02 NOTE — ED Notes (Signed)
Pt states "I do no want to leave tonight, I have no where to go. Why can't you just keep me?" RN advised that pt has been evaluated and is provided prescriptions and follow up care; pt states "I am just going to come right back. This is [place] you need a bigger place to house Behavioral [facility] Patients"; pt resistant to being discharged

## 2016-04-03 ENCOUNTER — Encounter (HOSPITAL_COMMUNITY): Payer: Self-pay | Admitting: Emergency Medicine

## 2016-04-03 ENCOUNTER — Emergency Department (HOSPITAL_COMMUNITY)
Admission: EM | Admit: 2016-04-03 | Discharge: 2016-04-04 | Payer: Medicaid Other | Attending: Emergency Medicine | Admitting: Emergency Medicine

## 2016-04-03 DIAGNOSIS — F319 Bipolar disorder, unspecified: Secondary | ICD-10-CM | POA: Diagnosis not present

## 2016-04-03 DIAGNOSIS — I10 Essential (primary) hypertension: Secondary | ICD-10-CM | POA: Insufficient documentation

## 2016-04-03 DIAGNOSIS — R442 Other hallucinations: Secondary | ICD-10-CM | POA: Diagnosis not present

## 2016-04-03 DIAGNOSIS — F250 Schizoaffective disorder, bipolar type: Secondary | ICD-10-CM

## 2016-04-03 DIAGNOSIS — F17210 Nicotine dependence, cigarettes, uncomplicated: Secondary | ICD-10-CM | POA: Diagnosis not present

## 2016-04-03 DIAGNOSIS — R440 Auditory hallucinations: Secondary | ICD-10-CM

## 2016-04-03 DIAGNOSIS — Z79899 Other long term (current) drug therapy: Secondary | ICD-10-CM | POA: Insufficient documentation

## 2016-04-03 DIAGNOSIS — J45909 Unspecified asthma, uncomplicated: Secondary | ICD-10-CM | POA: Diagnosis not present

## 2016-04-03 LAB — RAPID URINE DRUG SCREEN, HOSP PERFORMED
AMPHETAMINES: NOT DETECTED
BENZODIAZEPINES: NOT DETECTED
Barbiturates: NOT DETECTED
Cocaine: NOT DETECTED
OPIATES: NOT DETECTED
TETRAHYDROCANNABINOL: NOT DETECTED

## 2016-04-03 LAB — COMPREHENSIVE METABOLIC PANEL
ALBUMIN: 4.5 g/dL (ref 3.5–5.0)
ALT: 19 U/L (ref 17–63)
AST: 20 U/L (ref 15–41)
Alkaline Phosphatase: 72 U/L (ref 38–126)
Anion gap: 6 (ref 5–15)
BILIRUBIN TOTAL: 0.6 mg/dL (ref 0.3–1.2)
BUN: 16 mg/dL (ref 6–20)
CHLORIDE: 106 mmol/L (ref 101–111)
CO2: 26 mmol/L (ref 22–32)
CREATININE: 1.16 mg/dL (ref 0.61–1.24)
Calcium: 8.9 mg/dL (ref 8.9–10.3)
GFR calc Af Amer: >60 mL/min (ref >60)
GLUCOSE: 98 mg/dL (ref 65–99)
Potassium: 3.8 mmol/L (ref 3.5–5.1)
Sodium: 138 mmol/L (ref 135–145)
TOTAL PROTEIN: 7.9 g/dL (ref 6.5–8.1)

## 2016-04-03 LAB — CBC
HEMATOCRIT: 40 % (ref 39.0–52.0)
Hemoglobin: 14.7 g/dL (ref 13.0–17.0)
MCH: 34.1 pg — AB (ref 26.0–34.0)
MCHC: 36.8 g/dL — ABNORMAL HIGH (ref 30.0–36.0)
MCV: 92.8 fL (ref 78.0–100.0)
Platelets: 133 10*3/uL — ABNORMAL LOW (ref 150–400)
RBC: 4.31 MIL/uL (ref 4.22–5.81)
RDW: 12.9 % (ref 11.5–15.5)
WBC: 6.8 10*3/uL (ref 4.0–10.5)

## 2016-04-03 LAB — SALICYLATE LEVEL: Salicylate Lvl: <4 mg/dL (ref 2.8–30.0)

## 2016-04-03 LAB — ACETAMINOPHEN LEVEL: Acetaminophen (Tylenol), Serum: <10 ug/mL — ABNORMAL LOW (ref 10–30)

## 2016-04-03 LAB — ETHANOL
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
## 2016-04-04 ENCOUNTER — Inpatient Hospital Stay
Admission: EM | Admit: 2016-04-04 | Discharge: 2016-04-11 | DRG: 885 | Disposition: A | Discharge status: Home / Self Care | Payer: Medicaid Other | Source: Intra-hospital | Attending: Psychiatry | Admitting: Psychiatry

## 2016-04-04 DIAGNOSIS — F250 Schizoaffective disorder, bipolar type: Secondary | ICD-10-CM | POA: Diagnosis not present

## 2016-04-04 DIAGNOSIS — F17210 Nicotine dependence, cigarettes, uncomplicated: Secondary | ICD-10-CM | POA: Diagnosis present

## 2016-04-04 DIAGNOSIS — Z9114 Patient's other noncompliance with medication regimen: Secondary | ICD-10-CM

## 2016-04-04 DIAGNOSIS — Z888 Allergy status to other drugs, medicaments and biological substances status: Secondary | ICD-10-CM

## 2016-04-04 DIAGNOSIS — Z9889 Other specified postprocedural states: Secondary | ICD-10-CM | POA: Diagnosis not present

## 2016-04-04 DIAGNOSIS — J45909 Unspecified asthma, uncomplicated: Secondary | ICD-10-CM | POA: Diagnosis present

## 2016-04-04 DIAGNOSIS — Z9119 Patient's noncompliance with other medical treatment and regimen: Secondary | ICD-10-CM

## 2016-04-04 DIAGNOSIS — R440 Auditory hallucinations: Secondary | ICD-10-CM | POA: Diagnosis not present

## 2016-04-04 DIAGNOSIS — F17200 Nicotine dependence, unspecified, uncomplicated: Secondary | ICD-10-CM | POA: Diagnosis present

## 2016-04-04 DIAGNOSIS — I10 Essential (primary) hypertension: Secondary | ICD-10-CM | POA: Diagnosis present

## 2016-04-04 DIAGNOSIS — F22 Delusional disorders: Secondary | ICD-10-CM | POA: Diagnosis present

## 2016-04-04 DIAGNOSIS — G4700 Insomnia, unspecified: Secondary | ICD-10-CM | POA: Diagnosis present

## 2016-04-04 DIAGNOSIS — Z590 Homelessness: Secondary | ICD-10-CM

## 2016-04-04 MED ORDER — QUETIAPINE FUMARATE ER 300 MG PO TB24
600.0000 mg | ORAL_TABLET | Freq: Every day | ORAL | Status: DC
  Administered 2016-04-04 – 2016-04-06 (×3): 600 mg via ORAL
  Filled 2016-04-04 (×3): qty 2

## 2016-04-04 MED ORDER — TRAZODONE HCL 100 MG PO TABS: 100.0000 mg | ORAL_TABLET | Freq: Every day | ORAL | Status: DC

## 2016-04-04 MED ORDER — NICOTINE 21 MG/24HR TD PT24: 21.0000 mg | MEDICATED_PATCH | Freq: Every day | TRANSDERMAL | Status: DC

## 2016-04-04 MED ORDER — QUETIAPINE FUMARATE 100 MG PO TABS: 100.0000 mg | ORAL_TABLET | Freq: Three times a day (TID) | ORAL | Status: DC | PRN

## 2016-04-04 MED ORDER — MAGNESIUM HYDROXIDE 400 MG/5ML PO SUSP: 30.0000 mL | Freq: Every day | ORAL | Status: DC | PRN

## 2016-04-04 MED ORDER — ACETAMINOPHEN 325 MG PO TABS: 650.0000 mg | ORAL_TABLET | Freq: Four times a day (QID) | ORAL | Status: DC | PRN

## 2016-04-04 MED ORDER — ALUM & MAG HYDROXIDE-SIMETH 200-200-20 MG/5ML PO SUSP: 30.0000 mL | ORAL | Status: DC | PRN

## 2016-04-04 MED ORDER — QUETIAPINE FUMARATE ER 200 MG PO TB24: 200.0000 mg | ORAL_TABLET | Freq: Every day | ORAL | Status: DC

## 2016-04-04 MED ORDER — TRAZODONE HCL 100 MG PO TABS: 100.0000 mg | ORAL_TABLET | Freq: Every evening | ORAL | Status: DC | PRN

## 2016-04-04 MED ORDER — QUETIAPINE FUMARATE ER 200 MG PO TB24
200.0000 mg | ORAL_TABLET | Freq: Every day | ORAL | Status: DC
  Administered 2016-04-04: 400 mg via ORAL
  Filled 2016-04-04: qty 2

## 2016-04-04 MED ORDER — CHLORPROMAZINE HCL 50 MG PO TABS
50.0000 mg | ORAL_TABLET | Freq: Four times a day (QID) | ORAL | Status: DC | PRN
  Filled 2016-04-04: qty 1

## 2016-04-04 MED ORDER — MONTELUKAST SODIUM 10 MG PO TABS
10.0000 mg | ORAL_TABLET | Freq: Every day | ORAL | Status: DC
  Administered 2016-04-04 – 2016-04-10 (×7): 10 mg via ORAL
  Filled 2016-04-04 (×7): qty 1

## 2016-04-04 MED ORDER — ACETAMINOPHEN 325 MG PO TABS
650.0000 mg | ORAL_TABLET | Freq: Four times a day (QID) | ORAL | Status: DC | PRN
  Administered 2016-04-10: 650 mg via ORAL
  Filled 2016-04-04: qty 2

## 2016-04-04 NOTE — Tx Team (Signed)
Initial Interdisciplinary Treatment Plan   PATIENT STRESSORS: Health problems Medication change or noncompliance   PATIENT STRENGTHS: Average or above average intelligence Communication skills Financial means   PROBLEM LIST: Problem List/Patient Goals Date to be addressed Date deferred Reason deferred Estimated date of resolution  Schizoaffective disorder 04/04/2016     Auditory hallucination 04/04/2016                                                DISCHARGE CRITERIA:  Motivation to continue treatment in a less acute level of care Verbal commitment to aftercare and medication compliance  PRELIMINARY DISCHARGE PLAN: Attend aftercare/continuing care group Attend PHP/IOP Placement in alternative living arrangements  PATIENT/FAMIILY INVOLVEMENT: This treatment plan has been presented to and reviewed with the patient, Ryan York, and/or family member,  The patient and family have been given the opportunity to ask questions and make suggestions.  Ryan York 04/04/2016, 3:03 PM

## 2016-04-04 NOTE — Progress Notes (Signed)
Patient is being transported to [facility]. Report was called in at 1210. Patient denies having any HI or SI at this time. Patient left with [place] and all belongings at 1241. VS were assessed and are stable.

## 2016-04-04 NOTE — ED Notes (Signed)
Awake ranting and pacing in room. Redirected with food, patient resting at this time.

## 2016-04-04 NOTE — H&P (Addendum)
Psychiatric Admission Assessment Adult  Patient Identification: Ryan York MRN:  [id number] Date of Evaluation:  04/04/2016 Chief Complaint:  Schizoeffective Principal Diagnosis: Schizoaffective disorder, bipolar type ([place]) Diagnosis:   Patient Active Problem List   Diagnosis Date Noted  . Tobacco use disorder [F17.200] 04/04/2016  . Schizoaffective disorder, bipolar type ([place]) [F25.0] 04/20/2012   History of Present Illness:   Identifying data. Ryan York is a 45 year old male of schizoaffective disorder.  Chief complaint. "I need to be out of city limits."  History of present illness. Information was obtained from the patient and the chart. The patient has a long history of psychosis and mood over 30 prior psychiatric admissions by his own account. He has been in the care of [facility] in [place]. He has been prescribed Seroquel. He reports good treatment compliance. He started experiencing increased auditory hallucinations that are "catching him"  when he is within city limits. E needs to be "in [facility]" to feel safe. The voices are commanding him to "jerk off" and become a homosexual. He denies any symptoms of depression, or anxiety but he is very irritable and easily agitated. He got upset with his nurse. He gives me very limited information He was masturbating when I walked into his room.He denies alcohol or illicit substance use.  Past psychiatric history. Multiple psychiatric admissions. He does not remember any other medications that Seroquel He denies ever attempting suicide.  Family psychiatric history. He denies any.  Social history. He claims to be homeless. He is disabled from mental illness. He has health insurance.  Depression Symptoms or illicit substance use.Alcohol Screening: 1. How often do you have a drink containing alcohol?: Never 2. How many drinks containing alcohol do you have on a typical day when you are drinking?: 1 or 2 3. How often do you  have six or more drinks on one occasion?: Never Preliminary Score: 0 4. How often during the last year have you found that you were not able to stop drinking once you had started?: Never 5. How often during the last year have you failed to do what was normally expected from you becasue of drinking?: Never 6. How often during the last year have you needed a first drink in the morning to get yourself going after a heavy drinking session?: Never 7. How often during the last year have you had a feeling of guilt of remorse after drinking?: Never 8. How often during the last year have you been unable to remember what happened the night before because you had been drinking?: Never 9. Have you or someone else been injured as a result of your drinking?: No 10. Has a relative or friend or a doctor or another health worker been concerned about your drinking or suggested you cut down?: No Alcohol Use Disorder Identification Test Final Score (AUDIT): 0 Brief Intervention: AUDIT score less than 7 or less-screening does not suggest unhealthy drinking-brief intervention not indicated Substance Abuse History in the last 12 months:  No. Consequences of Substance Abuse: NA Previous Psychotropic Medications: Yes  Psychological Evaluations: No  Past Medical History:  Past Medical History  Diagnosis Date  . Bipolar disorder ([place])   . Schizophrenia ([place])   . Asthma   . Hypertension     Past Surgical History  Procedure Laterality Date  . Knee surgery     Family History: History reviewed. No pertinent family history.  Tobacco Screening: '@FLOW'$ ([id number])::1)@ Social History:  History  Alcohol Use No    Comment:  Pt stated that he rarely consumes alcohol     History  Drug Use No    Comment: Pt denies     Additional Social History:      History of alcohol / drug use?: No history of alcohol / drug abuse                    Allergies:   Allergies  Allergen Reactions  . Vistaril  [Hydroxyzine] Nausea Only    Severe stomach pain   Lab Results:  Results for orders placed or performed during the hospital encounter of 04/03/16 (from the past 48 hour(s))  Rapid urine drug screen (hospital performed)     Status: None   Collection Time: 04/03/16 10:26 PM  Result Value Ref Range   Opiates NONE DETECTED NONE DETECTED   Cocaine NONE DETECTED NONE DETECTED   Benzodiazepines NONE DETECTED NONE DETECTED   Amphetamines NONE DETECTED NONE DETECTED   Tetrahydrocannabinol NONE DETECTED NONE DETECTED   Barbiturates NONE DETECTED NONE DETECTED    Comment:        DRUG SCREEN FOR MEDICAL PURPOSES ONLY.  IF CONFIRMATION IS NEEDED FOR ANY PURPOSE, NOTIFY LAB WITHIN 5 DAYS.        LOWEST DETECTABLE LIMITS FOR URINE DRUG SCREEN Drug Class       Cutoff (ng/mL) Amphetamine      1000 Barbiturate      200 Benzodiazepine   461 Tricyclics       901 Opiates          300 Cocaine          300 THC              50   Comprehensive metabolic panel     Status: None   Collection Time: 04/03/16 10:44 PM  Result Value Ref Range   Sodium 138 135 - 145 mmol/L   Potassium 3.8 3.5 - 5.1 mmol/L   Chloride 106 101 - 111 mmol/L   CO2 26 22 - 32 mmol/L   Glucose, Bld 98 65 - 99 mg/dL   BUN 16 6 - 20 mg/dL   Creatinine, Ser 1.16 0.61 - 1.24 mg/dL   Calcium 8.9 8.9 - 10.3 mg/dL   Total Protein 7.9 6.5 - 8.1 g/dL   Albumin 4.5 3.5 - 5.0 g/dL   AST 20 15 - 41 U/L   ALT 19 17 - 63 U/L   Alkaline Phosphatase 72 38 - 126 U/L   Total Bilirubin 0.6 0.3 - 1.2 mg/dL   GFR calc non Af Amer >60 >60 mL/min   GFR calc Af Amer >60 >60 mL/min    Comment: (NOTE) The eGFR has been calculated using the CKD EPI equation. This calculation has not been validated in all clinical situations. eGFR's persistently <60 mL/min signify possible Chronic Kidney Disease.    Anion gap 6 5 - 15  cbc     Status: Abnormal   Collection Time: 04/03/16 10:44 PM  Result Value Ref Range   WBC 6.8 4.0 - 10.5 K/uL   RBC 4.31  4.22 - 5.81 MIL/uL   Hemoglobin 14.7 13.0 - 17.0 g/dL   HCT 40.0 39.0 - 52.0 %   MCV 92.8 78.0 - 100.0 fL   MCH 34.1 (H) 26.0 - 34.0 pg   MCHC 36.8 (H) 30.0 - 36.0 g/dL   RDW 12.9 11.5 - 15.5 %   Platelets 133 (L) 150 - 400 K/uL  Ethanol     Status: None  Collection Time: 04/03/16 10:45 PM  Result Value Ref Range   Alcohol, Ethyl (B) <5 <5 mg/dL    Comment:        LOWEST DETECTABLE LIMIT FOR SERUM ALCOHOL IS 5 mg/dL FOR MEDICAL PURPOSES ONLY   Salicylate level     Status: None   Collection Time: 04/03/16 10:45 PM  Result Value Ref Range   Salicylate Lvl <9.9 2.8 - 30.0 mg/dL  Acetaminophen level     Status: Abnormal   Collection Time: 04/03/16 10:45 PM  Result Value Ref Range   Acetaminophen (Tylenol), Serum <10 (L) 10 - 30 ug/mL    Comment:        THERAPEUTIC CONCENTRATIONS VARY SIGNIFICANTLY. A RANGE OF 10-30 ug/mL MAY BE AN EFFECTIVE CONCENTRATION FOR MANY PATIENTS. HOWEVER, SOME ARE BEST TREATED AT CONCENTRATIONS OUTSIDE THIS RANGE. ACETAMINOPHEN CONCENTRATIONS >150 ug/mL AT 4 HOURS AFTER INGESTION AND >50 ug/mL AT 12 HOURS AFTER INGESTION ARE OFTEN ASSOCIATED WITH TOXIC REACTIONS.     Blood Alcohol level:  Lab Results  Component Value Date   ETH <5 04/03/2016   ETH <5 35/70/1779    Metabolic Disorder Labs:  No results found for: HGBA1C, MPG No results found for: PROLACTIN No results found for: CHOL, TRIG, HDL, CHOLHDL, VLDL, LDLCALC  Current Medications: Current Facility-Administered Medications  Medication Dose Route Frequency Provider Last Rate Last Dose  . acetaminophen (TYLENOL) tablet 650 mg  650 mg Oral Q6H PRN Shatiqua Heroux B Kitty Cadavid, MD      . alum & mag hydroxide-simeth (MAALOX/MYLANTA) 200-200-20 MG/5ML suspension 30 mL  30 mL Oral Q4H PRN Alys Dulak B Charlissa Petros, MD      . chlorproMAZINE (THORAZINE) tablet 50 mg  50 mg Oral QID PRN Marialuisa Basara B Sarahjane Matherly, MD      . magnesium hydroxide (MILK OF MAGNESIA) suspension 30 mL  30 mL Oral Daily PRN  Edina Winningham B Brondon Wann, MD      . montelukast (SINGULAIR) tablet 10 mg  10 mg Oral QHS Addiel Mccardle B Makeya Hilgert, MD      . Derrill Memo ON 04/05/2016] nicotine (NICODERM CQ - dosed in mg/24 hours) patch 21 mg  21 mg Transdermal Q0600 Dunbar Buras B Theda Payer, MD      . QUEtiapine (SEROQUEL XR) 24 hr tablet 600 mg  600 mg Oral QHS Daryll Spisak B Bunnie Rehberg, MD      . traZODone (DESYREL) tablet 100 mg  100 mg Oral QHS PRN Danayah Smyre B Lorma Heater, MD       PTA Medications: Prescriptions prior to admission  Medication Sig Dispense Refill Last Dose  . montelukast (SINGULAIR) 10 MG tablet Take 10 mg by mouth daily as needed. For allergies.  0 04/02/2016 at Unknown time  . OLANZapine (ZYPREXA) 2.5 MG tablet Take 1 tablet (2.5 mg total) by mouth daily. (Patient not taking: Reported on 04/03/2016) 14 tablet 0 Not Taking at Unknown time  . QUEtiapine (SEROQUEL XR) 400 MG 24 hr tablet Take 200-400 mg by mouth at bedtime.    Past Week at Unknown time    Musculoskeletal: Strength & Muscle Tone: within normal limits Gait & Station: normal Patient leans: N/A  Psychiatric Specialty Exam: Physical Exam  Nursing note and vitals reviewed. Constitutional: He is oriented to person, place, and time. He appears well-developed and well-nourished.  HENT:  Head: Normocephalic and atraumatic.  Eyes: Conjunctivae and EOM are normal. Pupils are equal, round, and reactive to light.  Neck: Normal range of motion. Neck supple.  Cardiovascular: Normal rate, regular rhythm and normal heart sounds.   Respiratory:  Effort normal and breath sounds normal.  GI: Soft. Bowel sounds are normal.  Musculoskeletal: Normal range of motion.  Neurological: He is alert and oriented to person, place, and time.  Skin: Skin is warm and dry.    Review of Systems  Psychiatric/Behavioral: Positive for hallucinations.  All other systems reviewed and are negative.   Blood pressure 134/79, pulse 67, temperature 98.4 F (36.9 C), temperature source Oral, resp. rate  18, height 6' (1.829 m), weight 91.627 kg (202 lb), SpO2 100 %.Body mass index is 27.39 kg/(m^2).  See SRA.                                                  Sleep:          Treatment Plan Summary: Daily contact with patient to assess and evaluate symptoms and progress in treatment and Medication management   Ryan York is a 45 year old male with history of schizoaffective disorder admitted for worsening auditory hallucinations commanding him to be a homosexual.  1. Psychosis. We will continue Seroquel but increase the dose to 600 mg at bedtime for mood stabilization and psychosis.  2. Agitation. Thorazine as needed is available.  3. Allergies. We'll continue Singulair..  4. Insomnia. Trazodone is available.  5. Metabolic syndrome screening. Lipid profile, TSH, hemoglobin A1c and prolactin are pending.  6. Disposition. The patient is homeless. He will follow-up with [facility].  Observation Level/Precautions:  15 minute checks  Laboratory:  CBC Chemistry Profile UDS UA  Psychotherapy:    Medications:    Consultations:    Discharge Concerns:    Estimated LOS:  Other:     I certify that inpatient services furnished can reasonably be expected to improve the patient's condition.    Orson Slick, MD 7/7/20173:51 PM

## 2016-04-04 NOTE — BH Assessment (Signed)
[facility] Assessment Progress Note  Per Thedore MinsMojeed Akintayo, MD, this pt requires psychiatric hospitalization at this time. Berneice Heinrichina Tate, RN, [facility] reports that pt has been accepted to [facility] by Nelly RoutArchana Kumar, MD; Robinette Hainesalvin Manning corroborates this. Pt has signed Voluntary Admission and Consent for Treatment, as well as Consent to Release Information to his outpatient provider at [facility], and a notification call has been placed. Signed forms have been faxed to [phone], per Calvin's request. Pt's nurse has been notified, and agrees to send original paperwork along with pt via [place], and to call report to [phone].  Doylene Canninghomas Tawanna Funk, MA Triage Specialist [phone]

## 2016-04-04 NOTE — BH Assessment (Addendum)
Tele Assessment Note   Ryan York is an 45 y.o. male.  -Clinician reviewed note by Cheri FowlerKayla Rose, PA.  Patient talking about hearing voices.  He talks about voices being homosexual and wanting him to be homosexual.  Patient is up and pacing about the room.  He says that homosexual voices are trying to convince him to become homosexual.  He said that they want him to masturbate but he is not going to that to satisfy them.  Pt also talks about cell [place] and [place].  [place] to help [place].  Patient wants to go to [facility].  He says that the last time he was there was in 2008.  Patient denies any HI or SI.  He also denies any use of ETOH or illicit drugs.  Patient has outpatient services from [facility].  He does not receive community supports or ACTT team services from them.  [facility] in the last few days.  -Clinician discussed patient care with Maryjean Mornharles Kober, PA he recommended inpatient psychiatric care.  [facility] are [facility], TTS to seek placement.  Diagnosis: Schizoaffective d/o  Past Medical History:  Past Medical History  Diagnosis Date  . Bipolar disorder (HCC)   . Schizophrenia (H[facility])   . Asthma   . Hypertension     Past Surgical History  Procedure Laterality Date  . Knee surgery      Family History: No family history on file.  Social History:  reports that he has been smoking Cigarettes and Cigars.  He has a 5 pack-year smoking history. He has never used smokeless tobacco. He reports that he does not drink alcohol or use illicit drugs.  Additional Social History:  Alcohol / Drug Use Pain Medications: See PTA medication list Prescriptions: See PTA medication list Over the Counter: See PTA medication list History of alcohol / drug use?: No history of alcohol / drug abuse  CIWA: CIWA-Ar BP: 143/90 mmHg Pulse Rate: 73 COWS:    PATIENT STRENGTHS:  (choose at least two) Average or above average intelligence Capable of independent living Communication skills  Allergies:  Allergies  Allergen Reactions  . Vistaril [Hydroxyzine] Nausea Only    Severe stomach pain    Home Medications:  (Not in a hospital admission)  OB/GYN Status:  No LMP for male patient.  General Assessment Data Location of Assessment: [facility] Assessment: In system Is this a Tele or Face-to-Face Assessment?: Face-to-Face Is this an Initial Assessment or a Re-assessment for this encounter?: Initial Assessment Marital status: Single Is patient pregnant?: No Pregnancy Status: No Living Arrangements: Other (Comment) (Pt is homeless) Can pt return to current living arrangement?: Yes Admission Status: Voluntary Is patient capable of signing voluntary admission?: Yes Referral Source: Self/Family/Friend Insurance type: MCD     Crisis Care Plan Living Arrangements: Other (Comment) (Pt is homeless) Name of Psychiatrist: [facility] Name of Therapist: [facility]  Education Status Is patient currently in school?: No Highest grade of school patient has completed: 12th grade  Risk to self with the past 6 months Suicidal Ideation: No Has patient been a risk to self within the past 6 months prior to admission? : No Suicidal Intent: No Has patient had any suicidal intent within the past 6 months prior to admission? : No Is patient at risk for suicide?: No Suicidal Plan?: No Has patient had any suicidal plan within the past  6 months prior to admission? : No Access to Means: No What has been your use of drugs/alcohol within the last 12 months?: Pt reports none Previous Attempts/Gestures: No How many times?: 0 Other Self Harm Risks: Pt denies Triggers for Past Attempts: None known Intentional Self Injurious Behavior: None Family Suicide History: No Recent stressful life event(s): Other (Comment) (Homelessness & mental illness) Persecutory  voices/beliefs?: Yes Depression: No Depression Symptoms:  (Pt denies depressive symptoms) Substance abuse history and/or treatment for substance abuse?: No Suicide prevention information given to non-admitted patients: Not applicable  Risk to Others within the past 6 months Homicidal Ideation: No Does patient have any lifetime risk of violence toward others beyond the six months prior to admission? : No Thoughts of Harm to Others: No Current Homicidal Intent: No Current Homicidal Plan: No Access to Homicidal Means: No Identified Victim: No one History of harm to others?: No Assessment of Violence: None Noted Violent Behavior Description: None reported Does patient have access to weapons?: No Criminal Charges Pending?: No Does patient have a court date: No Is patient on probation?: No  Psychosis Hallucinations: Auditory, Tactile (Voices trying to get him to be homosexual) Delusions: Persecutory  Mental Status Report Appearance/Hygiene: Disheveled, Body odor, In scrubs Eye Contact: Fair Motor Activity: Freedom of movement, Unremarkable Speech: Pressured Level of Consciousness: Alert Mood: Anxious, Apprehensive, Despair, Helpless Affect: Appropriate to circumstance Anxiety Level: Moderate Thought Processes: Tangential Judgement: Unimpaired Orientation: Appropriate for developmental age Obsessive Compulsive Thoughts/Behaviors: None  Cognitive Functioning Concentration: Decreased Memory: Remote Intact, Recent Impaired IQ: Average Insight: Poor Impulse Control: Poor Appetite: Fair Weight Loss: 0 Weight Gain: 0 Sleep: Decreased Total Hours of Sleep:  (<4H/D) Vegetative Symptoms: None  ADLScreening [facility] Assessment Services) Patient's cognitive ability adequate to safely complete daily activities?: Yes Patient able to express need for assistance with ADLs?: Yes Independently performs ADLs?: Yes (appropriate for developmental age)  Prior Inpatient Therapy Prior  Inpatient Therapy: Yes Prior Therapy Dates: 2010, 2012, 2013 [facility]  Reason for Treatment: Schizoaffective disorder   Prior Outpatient Therapy Prior Outpatient Therapy: Yes Prior Therapy Dates: Last 3 years Prior Therapy Facilty/Provider(s): [facility] Reason for Treatment: mental halth Does patient have an ACCT team?: No Does patient have Intensive In-House Services?  : No Does patient have [facility] services? : No Does patient have P4CC services?: No  ADL Screening (condition at time of admission) Patient's cognitive ability adequate to safely complete daily activities?: Yes Is the patient deaf or have difficulty hearing?: No Does the patient have difficulty seeing, even when wearing glasses/contacts?: No Does the patient have difficulty concentrating, remembering, or making decisions?: Yes Patient able to express need for assistance with ADLs?: Yes Does the patient have difficulty dressing or bathing?: No Independently performs ADLs?: Yes (appropriate for developmental age) Does the patient have difficulty walking or climbing stairs?: No Weakness of Legs: None Weakness of Arms/Hands: None       Abuse/Neglect Assessment (Assessment to be complete while patient is alone) Physical Abuse: Denies Verbal Abuse: Denies Sexual Abuse: Denies Exploitation of patient/patient's resources: Denies Self-Neglect: Denies     Merchant navy officerAdvance Directives (For Healthcare) Does patient have an advance directive?: No Would patient like information on creating an advanced directive?: No - patient declined information    Additional Information 1:1 In Past 12 Months?: No CIRT Risk: No Elopement Risk: No Does patient have medical clearance?: Yes     Disposition:  Disposition Initial Assessment Completed for this Encounter: Yes Disposition of Patient: Other dispositions  Other disposition(s): Other (Comment) (To be run by the PA)  Beatriz Stallion  Ray 04/04/2016 1:52 AM

## 2016-04-04 NOTE — ED Notes (Signed)
Attempted to call report to [facility]. Spoke to [place] who requested that they are unable to take report until 1pm due to impending discharges. Accepting physician is Dr. Lucianne MussKumar. Report to be called to [phone]. Patient signed voluntary admission papers and will travel via [place].

## 2016-04-04 NOTE — [facility] Suicide Risk Assessment (Signed)
[facility] Ad[facility]   Nursing information obtained from:  Patient Demographic factors:  Male Current Mental Status:  NA Loss Factors:  NA Historical Factors:  NA Risk Reduction Factors:  NA  Total Time spent with patient: 1 hour Principal Problem: Schizoaffective disorder, bipolar type (HCC) Diagnosis:   Patient Active Problem List   Diagnosis Date Noted  . Tobacco use disorder [F17.200] 04/04/2016  . Schizoaffective disorder, bipolar type (HCC) [F25.0] 04/20/2012   Subjective Data: Auditory hallucinations, paranoia.  Continued Clinical Symptoms:  Alcohol Use Disorder Identification Test Final Score (AUDIT): 0 The "Alcohol Use Disorders Identification Test", Guidelines for Use in Primary Care, Second Edition.  World Science writerHealth Organization [facility](WHO). Score between 0-7:  no or low risk or alcohol related problems. Score between 8-15:  moderate risk of alcohol related problems. Score between 16-19:  high risk of alcohol related problems. Score 20 or above:  warrants further diagnostic evaluation for alcohol dependence and treatment.   CLINICAL FACTORS:   Schizophrenia:   Command hallucinatons Paranoid or undifferentiated type   Musculoskeletal: Strength & Muscle Tone: within normal limits Gait & Station: normal Patient leans: N/A  Psychiatric Specialty Exam: Physical Exam  Nursing note and vitals reviewed.   Review of Systems  Psychiatric/Behavioral: Positive for hallucinations. The patient is nervous/anxious.   All other systems reviewed and are negative.   Blood pressure 134/79, pulse 67, temperature 98.4 F (36.9 C), temperature source Oral, resp. rate 18, height 6' (1.829 m), weight 91.627 kg (202 lb), SpO2 100 %.Body mass index is 27.39 kg/(m^2).  General Appearance: Disheveled  Eye Contact:  Good  Speech:  Pressured  Volume:  Normal  Mood:  Angry, Dysphoric and Irritable  Affect:  Congruent  Thought Process:  Disorganized  Orientation:  Full  (Time, Place, and Person)  Thought Content:  Illogical, Delusions, Hallucinations: Auditory Command:  Commanding him to "jerk off" and be a homosexual. and Paranoid Ideation  Suicidal Thoughts:  No  Homicidal Thoughts:  No  Memory:  Immediate;   Fair Recent;   Fair Remote;   Fair  Judgement:  Poor  Insight:  Lacking  Psychomotor Activity:  Normal  Concentration:  Concentration: Fair and Attention Span: Fair  Recall:  Fis[employer]Fair  Fund of Knowledge:  Fair  Language:  Fair  Akathisia:  No  Handed:  Right  AIMS (if indicated):     Assets:  Communication Skills Desire for Improvement Financial Resources/Insurance Physical Health Resilience  ADL's:  Intact  Cognition:  WNL  Sleep:         COGNITIVE FEATURES THAT CONTRIBUTE TO RISK:  None    SUICIDE RISK:   Moderate:  Frequent suicidal ideation with limited intensity, and duration, some specificity in terms of plans, no associated intent, good self-control, limited dysphoria/symptomatology, some risk factors present, and identifiable protective factors, including available and accessible social support.  PLAN OF CARE: Hospital admission, medication management, discharge planning.  Mr. Tobey GrimHawley is a 45 year old male with history of schizoaffective disorder admitted for worsening auditory hallucinations commanding him to be a homosexual.  1. Psychosis. We will continue Seroquel but increase the dose to 600 mg at bedtime for mood stabilization and psychosis.  2. Agitation. Thorazine as needed is available.  3. Allergies. We'll continue Singulair..  4. Insomnia. Trazodone is available.  5. Metabolic syndrome screening. Lipid profile, TSH, hemoglobin A1c and prolactin are pending.  6. Disposition. The patient is homeless. He will follow-up with [facility].  I certify that inpatient services furnished can reasonably be  expected to improve the patient's condition.   Kristine LineaJolanta Lavone Barrientes, MD 04/04/2016, 3:43 PM

## 2016-04-04 NOTE — [facility] Group Notes (Signed)
[facility] Group Notes:  (Nursing/MHT/Case Management/Adjunct)  Date:  04/04/2016  Time:  4:02 PM  Type of Therapy:  Psychoeducational Skills  Participation Level:  Did Not Attend  [name] [name] 04/04/2016, 4:02 PM 

## 2016-04-04 NOTE — Progress Notes (Signed)
Patient pleasant and cooperative during admission assessment. Patient denies SI/HI at this time. Patient states that the voices still bothering him to be homosexual. Patient informed of fall risk status, fall risk assessed "low" at this time. Patient oriented to unit/staff/room. Patient denies any questions/concerns at this time. Patient safe on unit with Q15 minute checks for safety. Skin assessment & body search done.No contraband found.

## 2016-04-05 LAB — LIPID PANEL
CHOL/HDL RATIO: 4.4 ratio
Cholesterol: 163 mg/dL (ref 0–200)
HDL: 37 mg/dL — AB (ref >40)
LDL CALC: 105 mg/dL — AB (ref 0–99)
Triglycerides: 107 mg/dL (ref <150)
VLDL: 21 mg/dL (ref 0–40)

## 2016-04-05 LAB — HEMOGLOBIN A1C: Hgb A1c MFr Bld: 5.8 % (ref 4.0–6.0)

## 2016-04-05 LAB — TSH: TSH: 2.084 u[IU]/mL (ref 0.350–4.500)

## 2016-04-05 NOTE — [facility] Group Notes (Signed)
[facility] Group Notes:  (Nursing/MHT/Case Management/Adjunct)  Date:  04/05/2016  Time:  12:47 AM  Type of Therapy:  Evening Wrap-up Group  Participation Level:  Did Not Attend  Participation Quality:  N/A  Affect:  N/A  Cognitive:  N/A  Insight:  None  Engagement in Group:  N/A  Modes of Intervention:  Discussion  Summary of Progress/Problems:  Tomasita MorrowChelsea Nanta Kanton Kamel 04/05/2016, 12:47 AM

## 2016-04-05 NOTE — [facility] Counselor (Signed)
Adult Comprehensive Assessment  Patient ID: Ryan York, male DOB: 1971-02-22, 45 y.o. 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 get along IT trainer / Lack of resources (include bankruptcy): fixed income of SSI, food stamps and [employer] / Lack of housing: Pt left apartment because of voices and it was too small for his asthma  Physical health (include injuries & life threatening diseases): asthma, bade left leg Social relationships: lacks social support Substance abuse: none reported Bereavement / Loss: none reported  Living/Environment/Situation:  Living Arrangements: Alone (homeless)  Living conditions (as described by patient or guardian): "I left my apartment because of the voices"  How long has patient lived in current situation?: 2-3 days  What is atmosphere in current home: "They want me to be homosexual"   Family History:  Marital status: Separated Separated, when?: separated in 30, hasn't seen since 1999. What types of issues is patient dealing with in the relationship?: n/a Does patient have children?: No  Childhood History:  By whom was/is the patient raised?: Both parents Description of patient's relationship with caregiver when they were a child: alright, he was very active and didn't have time to get in trouble Patient's description of current relationship with people who raised him/her: good, they just have to tolerate me now, working on it. Does patient have siblings?: Yes Number of Siblings: 1 (older brother) Description of patient's current relationship with siblings: good Did patient suffer any verbal/emotional/physical/sexual abuse as a child?: No Did patient suffer from severe childhood neglect?: No Has  patient ever been sexually abused/assaulted/raped as an adolescent or adult?: No Was the patient ever a victim of a crime or a disaster?: No Witnessed domestic violence?: No Has patient been effected by domestic violence as an adult?: No  Education:  Highest grade of school patient has completed: graduated high school, vocational classes on line Currently a student?: Yes Name of school: [employer] online classes How long has the patient attended?: unsure Learning disability?: No  Employment/Work Situation:  Employment situation: On disability Why is patient on disability: Schizophrenia How long has patient been on disability: unsure Patient's job has been impacted by current illness: No What is the longest time patient has a held a job?: 15 months 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 Has patient ever been in the [employer]?: No Has patient ever served in Buyer, retail?: No  Financial Resources:  Surveyor, quantity resources: [employer];Food stamps;Medicaid Does patient have a representative payee or guardian?: No  Alcohol/Substance Abuse:  What has been your use of drugs/alcohol within the last 12 months?: none reported currently, age 44-drug use If attempted suicide, did drugs/alcohol play a role in this?: No Alcohol/Substance Abuse Treatment Hx: Past Tx, Inpatient If yes, describe treatment: ADS Has alcohol/substance abuse ever caused legal problems?: Yes (age 49-possession charges, )  Social Support System:  Patient's Community Support System: Production assistant, radio System: family, [facility] Type of faith/religion: none How does patient's faith help to cope with current illness?: n/a  Leisure/Recreation:  Leisure and Hobbies: writing poetry, collect cans, watch [employer]  Strengths/Needs:  What things does the patient do well?: I talk to people and relate to people In what areas does patient struggle / problems for patient: not having a lot of  energy  Discharge Plan:  Does patient have access  to transportation?: No Plan for no access to transportation at discharge: bus Will patient be returning to same living situation after discharge?: No Plan for living situation after discharge: wants to go to a shelter Currently receiving community mental health services: Yes (From Whom) ([facility]) Does patient have financial barriers related to discharge medications?: No  Summary/Recommendations:   Patient is a 45 year old male admitted  with a diagnosis of Schizoaffective Disorder. Patient presented to the hospital with hallucinations and delusions . Patient reports primary triggers for admission were "voice telling me to be homosexual. They want me to masturbate."  Patient will benefit from crisis stabilization, medication evaluation, group therapy and psycho education in addition to case management for discharge. At discharge, it is recommended that patient remain compliant with established discharge plan and continued treatment.   Daisy FloroCandace L Vick Filter MSW, [place]CSWA  04/05/2016

## 2016-04-05 NOTE — Progress Notes (Addendum)
[facility] MD Progress Note  04/05/2016 5:46 AM Ryan York  MRN:  [id number]  Subjective:  Ryan York was rather agitated on admission yesterday but his mother called today. He refused Thorazine but accepted as needed Seroquel to calm his behavior. Sleep and appetite are good. He tolerates medications well. There are no somatic complaints. He is out in the community and interacts with peers and staff appropriately.  Principal Problem: Schizoaffective disorder, bipolar type ([place]) Diagnosis:   Patient Active Problem List   Diagnosis Date Noted  . Tobacco use disorder [F17.200] 04/04/2016  . Schizoaffective disorder, bipolar type ([place]) [F25.0] 04/20/2012   Total Time spent with patient: 20 minutes  Past Psychiatric History: Schizoaffective disorder.  Past Medical History:  Past Medical History  Diagnosis Date  . Bipolar disorder ([place])   . Schizophrenia ([place])   . Asthma   . Hypertension     Past Surgical History  Procedure Laterality Date  . Knee surgery     Family History: History reviewed. No pertinent family history. Family Psychiatric  History: See H&P. Social History:  History  Alcohol Use No    Comment: Pt stated that he rarely consumes alcohol     History  Drug Use No    Comment: Pt denies     Social History   Social History  . Marital Status: Single    Spouse Name: N/A  . Number of Children: N/A  . Years of Education: N/A   Social History Main Topics  . Smoking status: Current Some Day Smoker -- 0.50 packs/day for 10 years    Types: Cigarettes, Cigars  . Smokeless tobacco: Never Used  . Alcohol Use: No     Comment: Pt stated that he rarely consumes alcohol  . Drug Use: No     Comment: Pt denies   . Sexual Activity: Yes    Birth Control/ Protection: None   Other Topics Concern  . None   Social History Narrative   Additional Social History:    History of alcohol / drug use?: No history of alcohol / drug abuse                    Sleep:  Fair  Appetite:  Fair  Current Medications: Current Facility-Administered Medications  Medication Dose Route Frequency Provider Last Rate Last Dose  . acetaminophen (TYLENOL) tablet 650 mg  650 mg Oral Q6H PRN Roslin Norwood B Phat Dalton, MD      . alum & mag hydroxide-simeth (MAALOX/MYLANTA) 200-200-20 MG/5ML suspension 30 mL  30 mL Oral Q4H PRN Travone Georg B Jamilette Suchocki, MD      . magnesium hydroxide (MILK OF MAGNESIA) suspension 30 mL  30 mL Oral Daily PRN Celisa Schoenberg B Skarleth Delmonico, MD      . montelukast (SINGULAIR) tablet 10 mg  10 mg Oral QHS Clovis Fredrickson, MD   10 mg at 04/04/16 2118  . nicotine (NICODERM CQ - dosed in mg/24 hours) patch 21 mg  21 mg Transdermal Q0600 Vivek Grealish B Aurie Harroun, MD      . QUEtiapine (SEROQUEL XR) 24 hr tablet 600 mg  600 mg Oral QHS Annais Crafts B Andrika Peraza, MD   600 mg at 04/04/16 2117  . QUEtiapine (SEROQUEL) tablet 100 mg  100 mg Oral TID PRN Nakeisha Greenhouse B Durante Violett, MD      . traZODone (DESYREL) tablet 100 mg  100 mg Oral QHS PRN Clovis Fredrickson, MD        Lab Results:  Results for orders placed or performed  during the hospital encounter of 04/03/16 (from the past 48 hour(s))  Rapid urine drug screen (hospital performed)     Status: None   Collection Time: 04/03/16 10:26 PM  Result Value Ref Range   Opiates NONE DETECTED NONE DETECTED   Cocaine NONE DETECTED NONE DETECTED   Benzodiazepines NONE DETECTED NONE DETECTED   Amphetamines NONE DETECTED NONE DETECTED   Tetrahydrocannabinol NONE DETECTED NONE DETECTED   Barbiturates NONE DETECTED NONE DETECTED    Comment:        DRUG SCREEN FOR MEDICAL PURPOSES ONLY.  IF CONFIRMATION IS NEEDED FOR ANY PURPOSE, NOTIFY LAB WITHIN 5 DAYS.        LOWEST DETECTABLE LIMITS FOR URINE DRUG SCREEN Drug Class       Cutoff (ng/mL) Amphetamine      1000 Barbiturate      200 Benzodiazepine   765 Tricyclics       465 Opiates          300 Cocaine          300 THC              50   Comprehensive metabolic panel      Status: None   Collection Time: 04/03/16 10:44 PM  Result Value Ref Range   Sodium 138 135 - 145 mmol/L   Potassium 3.8 3.5 - 5.1 mmol/L   Chloride 106 101 - 111 mmol/L   CO2 26 22 - 32 mmol/L   Glucose, Bld 98 65 - 99 mg/dL   BUN 16 6 - 20 mg/dL   Creatinine, Ser 1.16 0.61 - 1.24 mg/dL   Calcium 8.9 8.9 - 10.3 mg/dL   Total Protein 7.9 6.5 - 8.1 g/dL   Albumin 4.5 3.5 - 5.0 g/dL   AST 20 15 - 41 U/L   ALT 19 17 - 63 U/L   Alkaline Phosphatase 72 38 - 126 U/L   Total Bilirubin 0.6 0.3 - 1.2 mg/dL   GFR calc non Af Amer >60 >60 mL/min   GFR calc Af Amer >60 >60 mL/min    Comment: (NOTE) The eGFR has been calculated using the CKD EPI equation. This calculation has not been validated in all clinical situations. eGFR's persistently <60 mL/min signify possible Chronic Kidney Disease.    Anion gap 6 5 - 15  cbc     Status: Abnormal   Collection Time: 04/03/16 10:44 PM  Result Value Ref Range   WBC 6.8 4.0 - 10.5 K/uL   RBC 4.31 4.22 - 5.81 MIL/uL   Hemoglobin 14.7 13.0 - 17.0 g/dL   HCT 40.0 39.0 - 52.0 %   MCV 92.8 78.0 - 100.0 fL   MCH 34.1 (H) 26.0 - 34.0 pg   MCHC 36.8 (H) 30.0 - 36.0 g/dL   RDW 12.9 11.5 - 15.5 %   Platelets 133 (L) 150 - 400 K/uL  Ethanol     Status: None   Collection Time: 04/03/16 10:45 PM  Result Value Ref Range   Alcohol, Ethyl (B) <5 <5 mg/dL    Comment:        LOWEST DETECTABLE LIMIT FOR SERUM ALCOHOL IS 5 mg/dL FOR MEDICAL PURPOSES ONLY   Salicylate level     Status: None   Collection Time: 04/03/16 10:45 PM  Result Value Ref Range   Salicylate Lvl <0.3 2.8 - 30.0 mg/dL  Acetaminophen level     Status: Abnormal   Collection Time: 04/03/16 10:45 PM  Result Value Ref Range   Acetaminophen (  Tylenol), Serum <10 (L) 10 - 30 ug/mL    Comment:        THERAPEUTIC CONCENTRATIONS VARY SIGNIFICANTLY. A RANGE OF 10-30 ug/mL MAY BE AN EFFECTIVE CONCENTRATION FOR MANY PATIENTS. HOWEVER, SOME ARE BEST TREATED AT CONCENTRATIONS OUTSIDE  THIS RANGE. ACETAMINOPHEN CONCENTRATIONS >150 ug/mL AT 4 HOURS AFTER INGESTION AND >50 ug/mL AT 12 HOURS AFTER INGESTION ARE OFTEN ASSOCIATED WITH TOXIC REACTIONS.     Blood Alcohol level:  Lab Results  Component Value Date   ETH <5 04/03/2016   ETH <5 09/40/7680    Metabolic Disorder Labs: No results found for: HGBA1C, MPG No results found for: PROLACTIN No results found for: CHOL, TRIG, HDL, CHOLHDL, VLDL, LDLCALC  Physical Findings: AIMS: Facial and Oral Movements Muscles of Facial Expression: None, normal, ,  ,  , Dental Status Current problems with teeth and/or dentures?: No Does patient usually wear dentures?: No  CIWA:    COWS:     Musculoskeletal: Strength & Muscle Tone: within normal limits Gait & Station: normal Patient leans: N/A  Psychiatric Specialty Exam: Physical Exam  Nursing note and vitals reviewed.   Review of Systems  Psychiatric/Behavioral: Positive for hallucinations.  All other systems reviewed and are negative.   Blood pressure 134/79, pulse 67, temperature 98.4 F (36.9 C), temperature source Oral, resp. rate 18, height 6' (1.829 m), weight 91.627 kg (202 lb), SpO2 100 %.Body mass index is 27.39 kg/(m^2).  General Appearance: Fairly Groomed  Eye Contact:  Good  Speech:  Clear and Coherent  Volume:  Normal  Mood:  Dysphoric and Irritable  Affect:  Congruent  Thought Process:  Goal Directed  Orientation:  Full (Time, Place, and Person)  Thought Content:  Delusions and Paranoid Ideation  Suicidal Thoughts:  No  Homicidal Thoughts:  No  Memory:  Immediate;   Fair Recent;   Fair Remote;   Fair  Judgement:  Impaired  Insight:  Lacking  Psychomotor Activity:  Increased  Concentration:  Concentration: Fair and Attention Span: Fair  Recall:  [employer] of Knowledge:  Fair  Language:  Fair  Akathisia:  No  Handed:  Right  AIMS (if indicated):     Assets:  Communication Skills Desire for Improvement Financial  Resources/Insurance Housing Physical Health Resilience Social Support  ADL's:  Intact  Cognition:  WNL  Sleep:   7     Treatment Plan Summary: Daily contact with patient to assess and evaluate symptoms and progress in treatment and Medication management   Ryan York is a 45 year old male with history of schizoaffective disorder admitted for worsening auditory hallucinations commanding him to be a homosexual.  1. Psychosis. We will continue Seroquel but increase the dose to 600 mg at bedtime for mood stabilization and psychosis.  2. Agitation. We discontinued Thorazine and will offer additional Seroquel.   3. Allergies. We'll continue Singulair.  4. Insomnia. Trazodone is available.  5. Metabolic syndrome screening. Lipid profile, TSH, hemoglobin A1c are normal. Prolactin is still pending.  6. Disposition. The patient is homeless. He will follow-up with [facility].   Orson Slick, MD 04/05/2016, 5:46 AM

## 2016-04-05 NOTE — Progress Notes (Signed)
Pt denied any SI. Endorsed AH but did not wish to elaborate. Compliant with evening medications. Safety maintained. Voices no additional concerns at this time.

## 2016-04-05 NOTE — [facility] Group Notes (Signed)
[facility] LCSW Group Therapy  04/05/2016 2:09 PM  Type of Therapy:  Group Therapy  Participation Level:  Did Not Attend  Modes of Intervention:  Discussion, Education, Socialization and Support  Summary of Progress/Problems: Balance in life: Patients will discuss the concept of balance and how it looks and feels to be unbalanced. Pt will identify areas in their life that is unbalanced and ways to become more balanced.    Wendie Diskin L Ali Mclaurin  MSW, LCSWA  04/05/2016, 2:09 PM  

## 2016-04-05 NOTE — Progress Notes (Signed)
Pt has been pleasant and cooperative. Pt denies SI and A/V hallucinations although he does appear to be responding to internal stimuli at times. No inappropriate behaviors noted. Pt has been seclusive to his room . Pt is able to contract for safety. Will continue to observe and maintain a safe environment.

## 2016-04-06 LAB — PROLACTIN: Prolactin: 31.3 ng/mL — ABNORMAL HIGH (ref 4.0–15.2)

## 2016-04-06 NOTE — Progress Notes (Addendum)
[facility] MD Progress Note  04/06/2016 6:45 AM Ryan York  MRN:  [id number]  Subjective:  Ryan York is still very paranoid and delusional but no longer agitated. He tells me again that he needs to get out of the city limits. He told me that he is a little less worried about as in 2 months he is getting a car and he will be able to be in the country. He takes Seroquel and seems to tolerate it well. There are no somatic complaints. Sleep and appetite are good. He hardly leaves his room and does not participate in programming.   Principal Problem: Schizoaffective disorder, bipolar type ([facility]) Diagnosis:   Patient Active Problem List   Diagnosis Date Noted  . Tobacco use disorder [F17.200] 04/04/2016  . Schizoaffective disorder, bipolar type ([facility]) [F25.0] 04/20/2012   Total Time spent with patient: 20 minutes  Past Psychiatric History: Schizoaffective disorder.  Past Medical History:  Past Medical History  Diagnosis Date  . Bipolar disorder ([facility])   . Schizophrenia ([facility])   . Asthma   . Hypertension     Past Surgical History  Procedure Laterality Date  . Knee surgery     Family History: History reviewed. No pertinent family history. Family Psychiatric  History: See H&P.  Social History:  History  Alcohol Use No    Comment: Pt stated that he rarely consumes alcohol     History  Drug Use No    Comment: Pt denies     Social History   Social History  . Marital Status: Single    Spouse Name: N/A  . Number of Children: N/A  . Years of Education: N/A   Social History Main Topics  . Smoking status: Current Some Day Smoker -- 0.50 packs/day for 10 years    Types: Cigarettes, Cigars  . Smokeless tobacco: Never Used  . Alcohol Use: No     Comment: Pt stated that he rarely consumes alcohol  . Drug Use: No     Comment: Pt denies   . Sexual Activity: Yes    Birth Control/ Protection: None   Other Topics Concern  . None   Social History Narrative   Additional Social History:     History of alcohol / drug use?: No history of alcohol / drug abuse                    Sleep: Fair  Appetite:  Fair  Current Medications: Current Facility-Administered Medications  Medication Dose Route Frequency Provider Last Rate Last Dose  . acetaminophen (TYLENOL) tablet 650 mg  650 mg Oral Q6H PRN Rogen Porte B Aaronmichael Brumbaugh, MD      . alum & mag hydroxide-simeth (MAALOX/MYLANTA) 200-200-20 MG/5ML suspension 30 mL  30 mL Oral Q4H PRN Ledarius Leeson B Kathalina Ostermann, MD      . magnesium hydroxide (MILK OF MAGNESIA) suspension 30 mL  30 mL Oral Daily PRN Shaquaya Wuellner B Sudeep Scheibel, MD      . montelukast (SINGULAIR) tablet 10 mg  10 mg Oral QHS Shari Prows, MD   10 mg at 04/05/16 2132  . nicotine (NICODERM CQ - dosed in mg/24 hours) patch 21 mg  21 mg Transdermal Q0600 Shari Prows, MD   21 mg at 04/05/16 0919  . QUEtiapine (SEROQUEL XR) 24 hr tablet 600 mg  600 mg Oral QHS Maleta Pacha B Ellice Boultinghouse, MD   600 mg at 04/05/16 2132  . QUEtiapine (SEROQUEL) tablet 100 mg  100 mg Oral TID PRN Takisha Pelle B  Brailey Buescher, MD      . traZODone (DESYREL) tablet 100 mg  100 mg Oral QHS PRN Shari ProwsJolanta B Tiandra Swoveland, MD        Lab Results:  Results for orders placed or performed during the hospital encounter of 04/04/16 (from the past 48 hour(s))  Hemoglobin A1c     Status: None   Collection Time: 04/05/16  6:36 AM  Result Value Ref Range   Hgb A1c MFr Bld 5.8 4.0 - 6.0 %  Lipid panel, fasting     Status: Abnormal   Collection Time: 04/05/16  6:36 AM  Result Value Ref Range   Cholesterol 163 0 - 200 mg/dL   Triglycerides 098107 <119<150 mg/dL   HDL 37 (L) >14>40 mg/dL   Total CHOL/HDL Ratio 4.4 RATIO   VLDL 21 0 - 40 mg/dL   LDL Cholesterol 782105 (H) 0 - 99 mg/dL    Comment:        Total Cholesterol/HDL:CHD Risk Coronary Heart Disease Risk Table                     Men   Women  1/2 Average Risk   3.4   3.3  Average Risk       5.0   4.4  2 X Average Risk   9.6   7.1  3 X Average Risk  23.4   11.0         Use the calculated Patient Ratio above and the CHD Risk Table to determine the patient's CHD Risk.        ATP III CLASSIFICATION (LDL):  <100     mg/dL   Optimal  [id number]  mg/dL   Near or Above                    Optimal  130-159  mg/dL   Borderline  [id number]  mg/dL   High  >469>190     mg/dL   Very High   TSH     Status: None   Collection Time: 04/05/16  6:36 AM  Result Value Ref Range   TSH 2.084 0.350 - 4.500 uIU/mL    Blood Alcohol level:  Lab Results  Component Value Date   ETH <5 04/03/2016   ETH <5 04/01/2016    Metabolic Disorder Labs: Lab Results  Component Value Date   HGBA1C 5.8 04/05/2016   No results found for: PROLACTIN Lab Results  Component Value Date   CHOL 163 04/05/2016   TRIG 107 04/05/2016   HDL 37* 04/05/2016   CHOLHDL 4.4 04/05/2016   VLDL 21 04/05/2016   LDLCALC 105* 04/05/2016    Physical Findings: AIMS: Facial and Oral Movements Muscles of Facial Expression: None, normal, ,  ,  , Dental Status Current problems with teeth and/or dentures?: No Does patient usually wear dentures?: No  CIWA:    COWS:     Musculoskeletal: Strength & Muscle Tone: within normal limits Gait & Station: normal Patient leans: N/A  Psychiatric Specialty Exam: Physical Exam  Nursing note and vitals reviewed.   Review of Systems  Psychiatric/Behavioral: Positive for hallucinations.  All other systems reviewed and are negative.   Blood pressure 115/73, pulse 83, temperature 97.3 F (36.3 C), temperature source Oral, resp. rate 18, height 6' (1.829 m), weight 91.627 kg (202 lb), SpO2 100 %.Body mass index is 27.39 kg/(m^2).  General Appearance: Fairly Groomed  Eye Contact:  Minimal  Speech:  Garbled  Volume:  Normal  Mood:  Dysphoric  Affect:  Appropriate  Thought Process:  Disorganized  Orientation:  Full (Time, Place, and Person)  Thought Content:  Delusions and Paranoid Ideation  Suicidal Thoughts:  No  Homicidal Thoughts:  No  Memory:  Immediate;    Fair Recent;   Fair Remote;   Fair  Judgement:  Impaired  Insight:  Lacking  Psychomotor Activity:  Decreased  Concentration:  Concentration: Fair and Attention Span: Fair  Recall:  [employer] of Knowledge:  Fair  Language:  Fair  Akathisia:  No  Handed:  Right  AIMS (if indicated):     Assets:  Communication Skills Desire for Improvement Physical Health Resilience Social Support  ADL's:  Intact  Cognition:  WNL  Sleep:  Number of Hours: 6.15     Treatment Plan Summary: Daily contact with patient to assess and evaluate symptoms and progress in treatment and Medication management   Mr. Schroepfer is a 45 year old male with history of schizoaffective disorder admitted for worsening auditory hallucinations commanding him to be a homosexual.  1. Psychosis. We will continue Seroquel but increase the dose to 600 mg at bedtime for mood stabilization and psychosis.  2. Agitation. We discontinued Thorazine and will offer additional Seroquel.   3. Allergies. We'll continue Singulair.  4. Insomnia. Trazodone is available.  5. Metabolic syndrome screening. Lipid profile, TSH, hemoglobin A1c are normal. Prolactin 31.   6. Disposition. The patient is homeless. He will follow-up with [facility].   [name], MD 04/06/2016, 6:45 AM

## 2016-04-06 NOTE — [facility] Group Notes (Signed)
[facility] Group Notes:  (Nursing/MHT/Case Management/Adjunct)  Date:  04/06/2016  Time:  2:27 AM  Type of Therapy:  Psychoeducational Skills  Participation Level:  Minimal  Participation Quality:  Attentive  Affect:  Appropriate  Cognitive:  Alert  Insight:  Limited  Engagement in Group:  None  Modes of Intervention:  Discussion, Socialization and Support  Summary of Progress/Problems: Patient did not participate in any of the group discussions, patient just sat quietly and listened to the group discussions; but the patient stayed the entire time in group.  Ryan MilroyLaquanda Y Idus York 04/06/2016, 2:27 AM

## 2016-04-06 NOTE — Progress Notes (Addendum)
Pt has been pleasant and cooperative. Pt denies SI and A/V hallucinations although he does appear to be responding to internal stimuli at times. No inappropriate behaviors noted. Pt has been seclusive to his room . Pt is able to contract for safety. Pt still not attending unit activities. Will continue to observe and maintain a safe environment.

## 2016-04-06 NOTE — [facility] Group Notes (Signed)
[facility] Group Notes:  (Nursing/MHT/Case Management/Adjunct)  Date:  04/06/2016  Time:  9:57 PM  Type of Therapy:  Group Therapy  Participation Level:  Active  Participation Quality:  Appropriate  Affect:  Appropriate  Cognitive:  Appropriate  Insight:  Appropriate  Engagement in Group:  Engaged  Modes of Intervention:  Discussion  Summary of Progress/Problems:  Burt EkJanice Marie Sherwood Castilla 04/06/2016, 9:57 PM

## 2016-04-06 NOTE — [facility] Group Notes (Signed)
[facility] LCSW Group Therapy  04/06/2016 2:04 PM  Type of Therapy:  Group Therapy  Participation Level:  Did Not Attend  Modes of Intervention:  Discussion, Education, Socialization and Support  Summary of Progress/Problems: Pt will identify unhealthy thoughts and how they impact their emotions and behavior. Pt will be encouraged to discuss these thoughts, emotions and behaviors with the group.   Ryan York L Aryav Wimberly MSW, LCSWA  04/06/2016, 2:04 PM  

## 2016-04-06 NOTE — Progress Notes (Signed)
Pt denies SI/HI/AVH but at times appears to be responding to internal stimuli especially when he isolates to room. Medication compliant. No inappropriate behaviors noted when interacting with peers and staff. Voices no additional concerns. Safety maintained. Will continue to monitor.

## 2016-04-07 MED ORDER — QUETIAPINE FUMARATE ER 300 MG PO TB24
800.0000 mg | ORAL_TABLET | Freq: Every day | ORAL | Status: DC
  Administered 2016-04-07 – 2016-04-10 (×4): 800 mg via ORAL
  Filled 2016-04-07 (×4): qty 1

## 2016-04-07 NOTE — [facility] Group Notes (Signed)
[facility] Group Notes:  (Nursing/MHT/Case Management/Adjunct)  Date:  04/07/2016  Time:  9:24 PM  Type of Therapy:  Group Therapy  Participation Level:  Active  Participation Quality:  Appropriate  Affect:  Appropriate  Cognitive:  Appropriate  Insight:  Appropriate  Engagement in Group:  Engaged  Modes of Intervention:  Discussion  Summary of Progress/Problems:  Ryan York 04/07/2016, 9:24 PM

## 2016-04-07 NOTE — Progress Notes (Signed)
Pt denies SI/AVH but seems to occasionally respond to internal stimuli. Visible in dayroom with minimal interaction. Appropriate behavior with staff and peers. Forwards little. Medication compliant. Safety maintained.

## 2016-04-07 NOTE — Progress Notes (Addendum)
[facility] MD Progress Note  04/07/2016 4:05 AM SLY PARLEE  MRN:  [id number]  Subjective:  Mr. Casebolt is much less paranoid and disorganized but still unable to discuss discharge plans. He mostly stays in his room with the lights off. There is limited eye contact during the interview. His hygiene is questionable. He accepts Seroquel but does not participate in programming. He reports no side effects from medications and appetite are okay. He hardly participate in programming.  Principal Problem: Schizoaffective disorder, bipolar type (HCC) Diagnosis:   Patient Active Problem List   Diagnosis Date Noted  . Tobacco use disorder [F17.200] 04/04/2016  . Schizoaffective disorder, bipolar type (HCC) [F25.0] 04/20/2012   Total Time spent with patient: 20 minutes  Past Psychiatric History: Schizophrenia.  Past Medical History:  Past Medical History  Diagnosis Date  . Bipolar disorder (HCC)   . Schizophrenia (HCC)   . Asthma   . Hypertension     Past Surgical History  Procedure Laterality Date  . Knee surgery     Family History: History reviewed. No pertinent family history. Family Psychiatric  History: See H&P. Social History:  History  Alcohol Use No    Comment: Pt stated that he rarely consumes alcohol     History  Drug Use No    Comment: Pt denies     Social History   Social History  . Marital Status: Single    Spouse Name: N/A  . Number of Children: N/A  . Years of Education: N/A   Social History Main Topics  . Smoking status: Current Some Day Smoker -- 0.50 packs/day for 10 years    Types: Cigarettes, Cigars  . Smokeless tobacco: Never Used  . Alcohol Use: No     Comment: Pt stated that he rarely consumes alcohol  . Drug Use: No     Comment: Pt denies   . Sexual Activity: Yes    Birth Control/ Protection: None   Other Topics Concern  . None   Social History Narrative   Additional Social History:    History of alcohol / drug use?: No history of alcohol /  drug abuse                    Sleep: Fair  Appetite:  Fair  Current Medications: Current Facility-Administered Medications  Medication Dose Route Frequency Provider Last Rate Last Dose  . acetaminophen (TYLENOL) tablet 650 mg  650 mg Oral Q6H PRN Jizel Cheeks B Zamarah Ullmer, MD      . alum & mag hydroxide-simeth (MAALOX/MYLANTA) 200-200-20 MG/5ML suspension 30 mL  30 mL Oral Q4H PRN Octavius Shin B Early Steel, MD      . magnesium hydroxide (MILK OF MAGNESIA) suspension 30 mL  30 mL Oral Daily PRN Lyriq Jarchow B Jeraldine Primeau, MD      . montelukast (SINGULAIR) tablet 10 mg  10 mg Oral QHS Shari Prows, MD   10 mg at 04/06/16 2138  . nicotine (NICODERM CQ - dosed in mg/24 hours) patch 21 mg  21 mg Transdermal Q0600 Shari Prows, MD   21 mg at 04/05/16 0919  . QUEtiapine (SEROQUEL XR) 24 hr tablet 600 mg  600 mg Oral QHS Maire Govan B Drezden Seitzinger, MD   600 mg at 04/06/16 2138  . QUEtiapine (SEROQUEL) tablet 100 mg  100 mg Oral TID PRN Ines Rebel B Colon Rueth, MD      . traZODone (DESYREL) tablet 100 mg  100 mg Oral QHS PRN Shari Prows, MD  Lab Results:  Results for orders placed or performed during the hospital encounter of 04/04/16 (from the past 48 hour(s))  Hemoglobin A1c     Status: None   Collection Time: 04/05/16  6:36 AM  Result Value Ref Range   Hgb A1c MFr Bld 5.8 4.0 - 6.0 %  Lipid panel, fasting     Status: Abnormal   Collection Time: 04/05/16  6:36 AM  Result Value Ref Range   Cholesterol 163 0 - 200 mg/dL   Triglycerides 409107 <811<150 mg/dL   HDL 37 (L) >91>40 mg/dL   Total CHOL/HDL Ratio 4.4 RATIO   VLDL 21 0 - 40 mg/dL   LDL Cholesterol 478105 (H) 0 - 99 mg/dL    Comment:        Total Cholesterol/HDL:CHD Risk Coronary Heart Disease Risk Table                     Men   Women  1/2 Average Risk   3.4   3.3  Average Risk       5.0   4.4  2 X Average Risk   9.6   7.1  3 X Average Risk  23.4   11.0        Use the calculated Patient Ratio above and the CHD Risk  Table to determine the patient's CHD Risk.        ATP III CLASSIFICATION (LDL):  <100     mg/dL   Optimal  295-621100-129  mg/dL   Near or Above                    Optimal  130-159  mg/dL   Borderline  [id number]  mg/dL   High  >846>190     mg/dL   Very High   TSH     Status: None   Collection Time: 04/05/16  6:36 AM  Result Value Ref Range   TSH 2.084 0.350 - 4.500 uIU/mL  Prolactin     Status: Abnormal   Collection Time: 04/05/16  6:36 AM  Result Value Ref Range   Prolactin 31.3 (H) 4.0 - 15.2 ng/mL    Comment: (NOTE) Performed At: [facility] [address] [place], [place] [id number] Mila HomerHancock William F MD LK:[id number]:[phone]     Blood Alcohol level:  Lab Results  Component Value Date   [facility]   ETH <5 04/01/2016    Metabolic Disorder Labs: Lab Results  Component Value Date   HGBA1C 5.8 04/05/2016   Lab Results  Component Value Date   PROLACTIN 31.3* 04/05/2016   Lab Results  Component Value Date   CHOL 163 04/05/2016   TRIG 107 04/05/2016   HDL 37* 04/05/2016   CHOLHDL 4.4 04/05/2016   VLDL 21 04/05/2016   LDLCALC 105* 04/05/2016    Physical Findings: AIMS: Facial and Oral Movements Muscles of Facial Expression: None, normal, ,  ,  , Dental Status Current problems with teeth and/or dentures?: No Does patient usually wear dentures?: No  CIWA:    COWS:     Musculoskeletal: Strength & Muscle Tone: within normal limits Gait & Station: normal Patient leans: N/A  Psychiatric Specialty Exam: Physical Exam  Nursing note and vitals reviewed.   Review of Systems  Psychiatric/Behavioral: Positive for hallucinations.  All other systems reviewed and are negative.   Blood pressure 115/79, pulse 68, temperature 97.9 F (36.6 C), temperature source Oral, resp. rate 18, height 6' (1.829 m), weight 91.627 kg (202 lb),  SpO2 100 %.Body mass index is 27.39 kg/(m^2).  General Appearance: Fairly Groomed  Eye Contact:  Minimal  Speech:  Clear and Coherent   Volume:  Decreased  Mood:  Depressed  Affect:  Blunt  Thought Process:  Disorganized  Orientation:  Full (Time, Place, and Person)  Thought Content:  Delusions and Paranoid Ideation  Suicidal Thoughts:  No  Homicidal Thoughts:  No  Memory:  Immediate;   Fair Recent;   Fair Remote;   Fair  Judgement:  Impaired  Insight:  Lacking  Psychomotor Activity:  Psychomotor Retardation  Concentration:  Concentration: Fair and Attention Span: Fair  Recall:  [employer] of Knowledge:  Fair  Language:  Fair  Akathisia:  No  Handed:  Right  AIMS (if indicated):     Assets:  Communication Skills Desire for Improvement Financial Resources/Insurance Physical Health Resilience Social Support  ADL's:  Intact  Cognition:  WNL  Sleep:  Number of Hours: 6.15     Treatment Plan Summary: Daily contact with patient to assess and evaluate symptoms and progress in treatment and Medication management   Mr. Cardinal is a 45 year old male with history of schizoaffective disorder admitted for worsening auditory hallucinations commanding him to be a homosexual.  1. Psychosis. We will continue Seroquel but increase the dose to 600 mg at bedtime for mood stabilization and psychosis.  2. Agitation. We discontinued Thorazine and will offer additional Seroquel.   3. Allergies. We'll continue Singulair.  4. Insomnia. Trazodone is available.  5. Metabolic syndrome screening. Lipid profile, TSH, hemoglobin A1c are normal. Prolactin 31.   6. Disposition. The patient is homeless. He will follow[facility]up with [facility].   [name], MD 04/07/2016, 4:05 AM

## 2016-04-07 NOTE — Progress Notes (Signed)
Patient pleasant and cooperative with care. Med compliant. Does not attend group. Decreased psychosis. Denies SI, HI, visual hallucinations. Endorses auditory hallucinations.  Encouragement and support offered. Pt receptive and remains safe on unit with q 15 min safety checks.

## 2016-04-07 NOTE — [facility] Group Notes (Signed)
[facility] LCSW Group Therapy   04/07/2016  1 PM  Type of Therapy: Group Therapy   Participation Level: Did Not Attend. Patient invited to participate but declined.    Ignacia Gentzler F. Narissa Beaufort, MSW, LCSWA, LCAS         

## 2016-04-07 NOTE — [facility] Group Notes (Signed)
[facility] LCSW Group Therapy   04/07/2016  9:30 AM  Type of Therapy: Group Therapy   Participation Level: Did Not Attend. Patient invited to participate but declined.    Feras Gardella F. Ryleeann Urquiza, MSW, LCSWA, LCAS     

## 2016-04-07 NOTE — [facility] Group Notes (Signed)
[facility] Group Notes:  (Nursing/MHT/Case Management/Adjunct)  Date:  04/07/2016  Time:  5:40 PM  Type of Therapy:  Psychoeducational Skills  Participation Level:  Active  Participation Quality:  Appropriate and Attentive  Affect:  Appropriate  Cognitive:  Appropriate  Insight:  Appropriate  Engagement in Group:  Engaged and Supportive  Modes of Intervention:  Activity, Discussion and Education  Summary of Progress/Problems:  Ryan York 04/07/2016, 5:40 PM

## 2016-04-07 NOTE — Plan of Care (Signed)
Problem: Activity: Goal: Interest or engagement in activities will improve Outcome: Progressing Pt not attending group

## 2016-04-08 NOTE — Plan of Care (Signed)
Problem: Education: Goal: Knowledge of [facility] Education information/materials will improve Outcome: Progressing Meets with treatment team to discuss discharge plan.

## 2016-04-08 NOTE — Progress Notes (Signed)
D: Patient appears some what anxious and keeps making statements about, "all these homosexuals". He refuses his 800 mg seroquel. Stating had to be alert enough if one of the homosexuals came in his room. He denies SI/HI/AVH. Denies Pain. Attended group and visible in the milieu.  A: Medication was given with education. Encouragement was provided.  R: Patient was compliant with 600 mg of of seroquel. He has remained calm and cooperative. Safety maintained with 15 min checks.

## 2016-04-08 NOTE — Tx Team (Signed)
Interdisciplinary Treatment Plan Update (Adult)  Date:  04/08/2016 Time Reviewed:  3:38 PM  Progress in Treatment: Attending groups: Yes. Participating in groups:  Yes. Taking medication as prescribed:  Yes. Tolerating medication:  Yes. Family/Significant othe contact made:  Yes, individual(s) contacted:   parents Patient understands diagnosis:  Yes. Discussing patient identified problems/goals with staff:  Yes. Medical problems stabilized or resolved:  Yes. Denies suicidal/homicidal ideation: Yes. Issues/concerns per patient self-inventory:  No. Other:  New problem(s) identified: No, Describe:     Discharge Plan or Barriers:HOme with parents, follow [facility] with [facility] for [facility]ication Management, therapy and referral to peer support or PSR program  Reason for Continuation of Hospitalization: Delusions  Hallucinations Medication stabilization  Comments:Ryan York is slightly less paranoid and disorganized today. He was able to participate in treatment team meeting today. He is still illogical and does not have a good grasp of his situation. He apparently turned in his apartment keys and does not want to return to his apartment due to paranoia. He is taking his medications as prescribed and yesterday we increase his Seroquel to 800 mg nightly. The dose he apparently is prescribed at home I believe that he's been improving nicely. He most likely is noncompliant with treatment in the community. He allows [place] to call his parents. Social worker spoke with his therapist was very familiar with his case who suggested change of antipsychotics. I discuss medication adjustment with the patient. He refuses any changes and declines injectable antipsychotic. He tried it in the past and didn't like it. There are no somatic complaints. Sleep and appetite are good.   Estimated length of stay: 7 days  New goal(s):  Review of initial/current patient goals per problem list:   1.  Goal(s): Patient will  participate in aftercare plan * Met: NO * Target date: at discharge * As evidenced by: Patient will participate within aftercare plan AEB aftercare provider and housing plan at discharge being identified  2.  Goal (s): Patient will demonstrate decreased symptoms of psychosis. * Met: No  *  Target date: at discharge As evidenced by: Patient will not endorse signs of psychosis or be deemed stable for discharge by MD.  Attendees: Patient:  Ryan York 7/11/20173:38 PM  Family:   7/11/20173:38 PM  Physician:  Orson Slick 7/11/20173:38 PM  Nursing:   Carolynn Sayers, RN 7/11/20173:38 PM  Case Manager:   7/11/20173:38 PM  Counselor:  Dossie Arbour, LCSW 7/11/20173:38 PM  Other:  Everitt Amber, Bowler 7/11/20173:38 PM  Other:   7/11/20173:38 PM  Other:   7/11/20173:38 PM  Other:  7/11/20173:38 PM  Other:  7/11/20173:38 PM  Other:  7/11/20173:38 PM  Other:  7/11/20173:38 PM  Other:  7/11/20173:38 PM  Other:  7/11/20173:38 PM  Other:   7/11/20173:38 PM   Scribe for Treatment Team:   August Saucer, 04/08/2016, 3:38 PM, MSW, LCSW

## 2016-04-08 NOTE — [facility] Group Notes (Signed)
Goals Group  Date/Time: 04/08/16 9am  Type of Therapy and Topic: Group Therapy: Goals Group: SMART Goals   Pt was called, but did not attend   Hai Grabe F. Jaydrien Wassenaar, LCSWA, LCAS  

## 2016-04-08 NOTE — Progress Notes (Signed)
[facility]  04/08/2016 1:32 PM Ryan York  MRN:  [id number]  Subjective:  Ryan York is slightly less paranoid and disorganized today. He was able to participate in treatment team meeting today. He is still illogical and does not have a good grasp of his situation. He apparently turned in his apartment keys and does not want to return to his apartment due to paranoia. He is taking his medications as prescribed and yesterday we increase his Seroquel to 800 mg nightly. The dose he apparently is prescribed at home I believe that he's been improving nicely. He most likely is noncompliant with treatment in the community. He allows Ryan York to call his parents. Social worker spoke with his therapist was very familiar with his case who suggested change of antipsychotics. I discuss medication adjustment with the patient. He refuses any changes and declines injectable antipsychotic. He tried it in the past and didn't like it. There are no somatic complaints. Sleep and appetite are good.   Principal Problem: Schizoaffective disorder, bipolar type (HCC) Diagnosis:   Patient Active Problem List   Diagnosis Date Noted  . Tobacco use disorder [F17.200] 04/04/2016  . Schizoaffective disorder, bipolar type (HCC) [F25.0] 04/20/2012   Total Time spent with patient: 20 minutes  Past Psychiatric Histoschizophrenia.  Past Medical History:  Past Medical History  Diagnosis Date  . Bipolar disorder (HCC)   . Schizophrenia (HCC)   . Asthma   . Hypertension     Past Surgical History  Procedure Laterality Date  . Knee surgery     Family History: History reviewed. No pertinent family history. Family Psychiatric  History: See H&P. ial History:  History  Alcohol Use No    Comment: Pt stated that he rarely consumes alcohol     History  Drug Use No    Comment: Pt denies     Social History   Social History  . Marital Status: Single    Spouse Name: N/A  . Number of Children: N/A  . Years of  Education: N/A   Social History Main Topics  . Smoking status: Current Some Day Smoker -- 0.50 packs/day for 10 years    Types: Cigarettes, Cigars  . Smokeless tobacco: Never Used  . Alcohol Use: No     Comment: Pt stated that he rarely consumes alcohol  . Drug Use: No     Comment: Pt denies   . Sexual Activity: Yes    Birth Control/ Protection: None   Other Topics Concern  . None   Social History Narrative   Additional Social History:    History of alcohol / drug use?: No history of alcohol / drug abuse                    Sleep: Fair  Appetite:  Fair  Current Medications: Current Facility-Administered Medications  Medication Dose Route Frequency Provider Last Rate Last Dose  . acetaminophen (TYLENOL) tablet 650 mg  650 mg Oral Q6H PRN Katelind Pytel B Josiyah Tozzi, MD      . alum & mag hydroxide-simeth (MAALOX/MYLANTA) 200-200-20 MG/5ML suspension 30 mL  30 mL Oral Q4H PRN Nakeda Lebron B Natiya Seelinger, MD      . magnesium hydroxide (MILK OF MAGNESIA) suspension 30 mL  30 mL Oral Daily PRN Kaylamarie Swickard B Simara Rhyner, MD      . montelukast (SINGULAIR) tablet 10 mg  10 mg Oral QHS Gurley Climer B Brandyn Lowrey, MD   10 mg at 04/07/16 2206  . nicotine (NICODERM CQ -  dosed in mg/24 hours) patch 21 mg  21 mg Transdermal Q0600 Shari Prows, MD   21 mg at 04/05/16 0919  . QUEtiapine (SEROQUEL XR) 24 hr tablet 800 mg  800 mg Oral QHS Daymen Hassebrock B Dang Mathison, MD   800 mg at 04/07/16 2206  . QUEtiapine (SEROQUEL) tablet 100 mg  100 mg Oral TID PRN Orelia Brandstetter B Sissy Goetzke, MD      . traZODone (DESYREL) tablet 100 mg  100 mg Oral QHS PRN Dheeraj Hail B Winni Ehrhard, MD        Lab Results: No results found for this or any previous visit (from the past 48 hour(s)).  Blood Alcohol level:  Lab Results  Component Value Date   ETH <5 04/03/2016   ETH <5 04/01/2016    Metabolic Disorder Labs: Lab Results  Component Value Date   HGBA1C 5.8 04/05/2016   Lab Results  Component Value Date   PROLACTIN 31.3*  04/05/2016   Lab Results  Component Value Date   CHOL 163 04/05/2016   TRIG 107 04/05/2016   HDL 37* 04/05/2016   CHOLHDL 4.4 04/05/2016   VLDL 21 04/05/2016   LDLCALC 105* 04/05/2016    Physical Findings: AIMS: Facial and Oral Movements Muscles of Facial Expression: None, normal, ,  ,  , Dental Status Current problems with teeth and/or dentures?: No Does patient usually wear dentures?: No  CIWA:    COWS:     Musculoskeletal: Strength & Muscle Tone: within normal limits Gait & Station: normal Patient leans: N/A  Psychiatric Specialty Exam: Physical Exam  Nursing note and vitals reviewed.   Review of Systems  Psychiatric/Behavioral: Positive for hallucinations.  All other systems reviewed and are negative.   Blood pressure 118/76, pulse 66, temperature 97.8 F (36.6 C), temperature source Oral, resp. rate 18, height 6' (1.829 m), weight 91.627 kg (202 lb), SpO2 100 %.Body mass index is 27.39 kg/(m^2).  General Appearance: Bizarre  Eye Contact:  Good  Speech:  Clear and Coherent  Volume:  Normal  Mood:  Euphoric  Affect:  Congruent  Thought Process:  Disorganized  Orientation:  Full (Time, Place, and Person)  Thought Content:  Illogical  Suicidal Thoughts:  No  Homicidal Thoughts:  No  Memory:  Immediate;   Fair Recent;   Fair Remote;   Fair  Judgement:  Poor  Insight:  Lacking  Psychomotor Activity:  Normal  Concentration:  Concentration: Fair and Attention Span: Fair  Recall:  Ryan York of Knowledge:  Fair  Language:  Fair  Akathisia:  No  Handed:  Right  AIMS (if indicated):     Assets:  Communication Skills Desire for Improvement Financial Resources/Insurance Physical Health Resilience Social Support  ADL's:  Intact  Cognition:  WNL  Sleep:  Number of Hours: 5.5     Treatment Plan Summary: Daily contact with patient to assess and evaluate symptoms and progress in treatment and Medication management   Ryan York is a 45 year old male with  history of schizoaffective disorder admitted for worsening auditory hallucinations commanding him to be a homosexual.  1. Psychosis. We continue Seroquel at 800 mg, apparently outpatient dose, for mood stabilization and psychosis.  2. Agitation. Resolved.    3. Allergies. We'll continue Singulair.  4. Insomnia. Trazodone is available.  5. Metabolic syndrome screening. Lipid profile, TSH, hemoglobin A1c are normal. Prolactin 31.   6. Disposition. The patient is homeless. He will [facility] with [facility].   [name], MD 04/08/2016, 1:32 PM

## 2016-04-08 NOTE — [facility] Group Notes (Signed)
[facility] LCSW Group Therapy   04/08/2016 3pm  Type of Therapy: Group Therapy   Participation Level: Active   Participation Quality: Attentive, Sharing and Supportive   Affect: Appropriate  Cognitive: Alert and Oriented   Insight: Developing/Improving and Engaged   Engagement in Therapy: Developing/Improving and Engaged   Modes of Intervention: Clarification, Confrontation, Discussion, Education, Exploration,  Limit-setting, Orientation, Problem-solving, Rapport Building, Dance movement psychotherapisteality Testing, Socialization and Support  Summary of Progress/Problems: The topic for group therapy was feelings about diagnosis. Pt actively participated in group discussion on their past and current diagnosis and how they feel towards this. Pt also identified how society and family members judge them, based on their diagnosis as well as stereotypes and stigmas. Pt shared that the pt feels that the pt has been unfairly stigmatized by family members because the pt is only slightly more acute than others and as such is judged unfairly.  Pt blames the pt's diagnosis on other family member's subjective judgement and demonstrates little to no insight into his own behavior and how it has negatively impacted the pt.  Pt displayed a tendency to dominate group sharing, presented as pleasant and cooperative when re-directed by the CSW.     Dorothe PeaJonathan F. Jera Headings, LCSWA, LCAS

## 2016-04-08 NOTE — [facility] Group Notes (Signed)
[facility] Group Notes:  (Nursing/MHT/Case Management/Adjunct)  Date:  04/08/2016  Time:  2:18 PM  Type of Therapy:  Psychoeducational Skills  Participation Level:  Did Not Attend  Ryan York 04/08/2016, 2:18 PM

## 2016-04-08 NOTE — [facility] Suicide Risk Assessment (Signed)
[facility] INPATIENT:  Family/Significant Other Suicide Prevention Education  Suicide Prevention Education:  Education Completed; Britta MccreedyBarbara and Jeni Sallesouglas Linenberger,  (name of family member/significant other) has been identified by the patient as the family member/significant other with whom the patient will be residing, and identified as the person(s) who will aid the patient in the event of a mental health crisis (suicidal ideations/suicide attempt).  With written consent from the patient, the family member/significant other has been provided the following suicide prevention education, prior to the and/or following the discharge of the patient.  The suicide prevention education provided includes the following:  Suicide risk factors  Suicide prevention and interventions  National Suicide Hotline telephone number  [facility] assessment telephone number  [facility] Emergency Assistance 911  [facility] and/or Residential Mobile [facility] telephone number  Request made of family/significant other to:  Remove weapons (e.g., guns, rifles, knives), all items previously/currently identified as safety concern.    Remove drugs/medications (over-the-counter, prescriptions, illicit drugs), all items previously/currently identified as a safety concern.  The family member/significant other verbalizes understanding of the suicide prevention education information provided.  The family member/significant other agrees to remove the items of safety concern listed above.  Glennon MacLaws, Evette Diclemente P, MSW, LCSW 04/08/2016, 3:37 PM

## 2016-04-08 NOTE — Progress Notes (Signed)
Patient with sad affect, cooperative behavior with meals, meds and plan of care. No SI/HI at this time. Quiet with peers. Meets with treatment team to discuss plan of care. Patient reports he was anxious rt staying in his studio apartment. States he feels his parents are supportive and will help him after discharge. Patient will have peer to peer support after discharge. Remains slightly suspicious rt meds and MD considers med adjustments. Safety maintained.

## 2016-04-08 NOTE — Progress Notes (Signed)
Recreation Therapy Notes  Date: 07.11.17 Time: 9:30 am Location: Craft Room  Group Topic: Self-expression  Goal Area(s) Addresses:  Patient will identify one color per emotion listed on wheel. Patient will verbalize one emotion experienced during session. Patient will be educated on other forms of self-expression.  Behavioral Response: Attentive, Interactive, Off Topic  Intervention: Emotion Wheel  Activity: Patients were given an Emotion Wheel worksheet and instructed to pick a color for each emotion listed on the wheel.  Education: LRT educated patients on different forms of self-expression.  Education Outcome: In group clarification offered  Clinical Observations/Feedback: Patient completed activity by picking a color for each emotion. Patient contributed to group discussion by stating what color he picked for one emotion. Patient went on to talk about interviews.  Jacquelynn CreeGreene,Alessio Bogan M, LRT/CTRS 04/08/2016 10:27 AM

## 2016-04-08 NOTE — Plan of Care (Signed)
Problem: Safety: Goal: Ability to remain free from injury will improve Outcome: Progressing Patient has remained free from harm during this shift.    

## 2016-04-09 NOTE — Progress Notes (Signed)
Recreation Therapy Notes  Date: 07.12.17 Time: 9:30 am Location: Craft Room  Group Topic: Self-esteem  Goal Area(s) Addresses:  Patient will write at least one positive trait about self. Patient will verbalize benefit of having healthy self-esteem.  Behavioral Response: Attentive  Intervention: I Am  Activity: Patients were given a worksheet with the letter I on it and instructed to write as many positive traits about themselves inside the letter.  Education: LRT educated patients on ways they can increase their self-esteem.  Education Outcome: In group clarification offered  Clinical Observations/Feedback: Patient wrote positive traits. Patient did not contribute to group discussion.  Jacquelynn CreeGreene,Jaymond Waage M, LRT/CTRS 04/09/2016 10:16 AM

## 2016-04-09 NOTE — Progress Notes (Signed)
D: Patient was still reluctant to take 800 mg Seroquel today. He states he can be somewhat tired with the 600 mg so he doesn't know how he would do with the 800 mg. He states he still has the AH. When asking him to elaborate he got paranoid and stated, "you're one of those undercover racist." Another nurse in there reassured him this was not the case and he was easily redirected. Denies Pain.  A: Medication was given with education. Encouragement was provided.  R: Patient was compliant with 600 mg of of seroquel. He has remained calm and cooperative. Safety maintained with 15 min checks.

## 2016-04-09 NOTE — [facility] Group Notes (Signed)
[facility] Group Notes:  (Nursing/MHT/Case Management/Adjunct)  Date:  04/09/2016  Time:  6:26 PM  Type of Therapy:  Psychoeducational Skills  Participation Level:  Active  Participation Quality:  Appropriate, Attentive and Sharing  Affect:  Appropriate  Cognitive:  Alert and Appropriate  Insight:  Appropriate  Engagement in Group:  Engaged  Modes of Intervention:  Discussion, Education and Support  Summary of Progress/Problems:  [facility] 04/09/2016, 6:26 PM

## 2016-04-09 NOTE — Progress Notes (Signed)
Patient presents flat, but brightens on approach. Continues to endorse auditory hallucinations. Denies SI, HI. Visible in mileu. Appropriate with staff and peers. Offers minimal conversation. Encouragement and support offered. Encouraged patient to attend groups and to verbalize feelings, pt receptive. Pt eating meals well and remains safe on unit with q 15 min checks.

## 2016-04-09 NOTE — [facility] Group Notes (Signed)
[facility] LCSW Group Therapy   04/09/2016  1pm   Type of Therapy: Group Therapy   Participation Level: Active   Participation Quality: Attentive, Sharing and Supportive   Affect: Depressed and Flat   Cognitive: Alert and Oriented   Insight: Developing/Improving and Engaged   Engagement in Therapy: Developing/Improving and Engaged   Modes of Intervention: Clarification, Confrontation, Discussion, Education, Exploration, Limit-setting, Orientation, Problem-solving, Rapport Building, Dance movement psychotherapisteality Testing, Socialization and Support   Summary of Progress/Problems: The topic for group today was emotional regulation. This group focused on both positive and negative emotion identification and allowed  group members to process ways to identify feelings, regulate negative emotions, and find healthy ways to manage internal/external emotions. Group members were asked to reflect on a time when their reaction to an emotion led to a negative outcome and explored how alternative responses using emotion regulation would have benefited them. Group members were also asked to discuss a time when emotion regulation was utilized when a negative emotion was experienced. Pt shared at length about how the pt had difficulty regulating the pt's emotions due to the actions of others in society.  Pt shared the pt plans to share with other group members in the future to open up the lines of communications and ventilate the pt's emotions in an healthy way. Pt seems to have made great improvement during group, as evidenced by increased sharing, sharing at length when prompted and pt presents a happy and increasingly energetic, as compared to previous sessions.     Dorothe PeaJonathan F. Kayelynn Abdou, MSW, LCSWA, LCAS

## 2016-04-09 NOTE — Progress Notes (Addendum)
[facility] MD Progress Note  04/09/2016 6:39 PM Ryan York  MRN:  [id number]  Subjective:  Ryan York denies depression, anxiety, or psychosis. He is not suicidal or homicidal. He tolerates medications well. He adamantly refuses injectable antipsychotics. There are no somtic complaints. Sleep and appetite are fair. Discharge tomorrow.  Principal Problem: Schizoaffective disorder, bipolar type (HCC) Diagnosis:   Patient Active Problem List   Diagnosis Date Noted  . Tobacco use disorder [F17.200] 04/04/2016  . Schizoaffective disorder, bipolar type (HCC) [F25.0] 04/20/2012   Total Time spent with patient: 20 minutes  Past Psychiatric History: schizoaffective disorder.  Past Medical History:  Past Medical History  Diagnosis Date  . Bipolar disorder (HCC)   . Schizophrenia (HCC)   . Asthma   . Hypertension     Past Surgical History  Procedure Laterality Date  . Knee surgery     Family History: History reviewed. No pertinent family history. Family Psychiatric  History: see H&P Social History:  History  Alcohol Use No    Comment: Pt stated that he rarely consumes alcohol     History  Drug Use No    Comment: Pt denies     Social History   Social History  . Marital Status: Single    Spouse Name: N/A  . Number of Children: N/A  . Years of Education: N/A   Social History Main Topics  . Smoking status: Current Some Day Smoker -- 0.50 packs/day for 10 years    Types: Cigarettes, Cigars  . Smokeless tobacco: Never Used  . Alcohol Use: No     Comment: Pt stated that he rarely consumes alcohol  . Drug Use: No     Comment: Pt denies   . Sexual Activity: Yes    Birth Control/ Protection: None   Other Topics Concern  . None   Social History Narrative   Additional Social History:    History of alcohol / drug use?: No history of alcohol / drug abuse                    Sleep: Fair  Appetite:  Fair  Current Medications: Current Facility-Administered  Medications  Medication Dose Route Frequency Provider Last Rate Last Dose  . acetaminophen (TYLENOL) tablet 650 mg  650 mg Oral Q6H PRN Aleeah Greeno B Dema Timmons, MD      . alum & mag hydroxide-simeth (MAALOX/MYLANTA) 200-200-20 MG/5ML suspension 30 mL  30 mL Oral Q4H PRN Nadalie Laughner B Vernie Vinciguerra, MD      . magnesium hydroxide (MILK OF MAGNESIA) suspension 30 mL  30 mL Oral Daily PRN Deadrian Toya B Davanta Meuser, MD      . montelukast (SINGULAIR) tablet 10 mg  10 mg Oral QHS Shari Prows, MD   10 mg at 04/08/16 2121  . nicotine (NICODERM CQ - dosed in mg/24 hours) patch 21 mg  21 mg Transdermal Q0600 Shari Prows, MD   21 mg at 04/05/16 0919  . QUEtiapine (SEROQUEL XR) 24 hr tablet 800 mg  800 mg Oral QHS Jacquelinne Speak B Chimene Salo, MD   800 mg at 04/08/16 2120  . QUEtiapine (SEROQUEL) tablet 100 mg  100 mg Oral TID PRN Teller Wakefield B Zacharius Funari, MD      . traZODone (DESYREL) tablet 100 mg  100 mg Oral QHS PRN Medha Pippen B Kieli Golladay, MD        Lab Results: No results found for this or any previous visit (from the past 48 hour(s)).  Blood Alcohol level:  Lab Results  Component Value Date   [facility] 04/[facility]   ETH <5 04/01/2016    Metabolic Disorder Labs: Lab Results  Component Value Date   HGBA1C 5.8 04/05/2016   Lab Results  Component Value Date   PROLACTIN 31.3* 04/05/2016   Lab Results  Component Value Date   CHOL 163 04/05/2016   TRIG 107 04/05/2016   HDL 37* 04/05/2016   CHOLHDL 4.4 04/05/2016   VLDL 21 04/05/2016   LDLCALC 105* 04/05/2016    Physical Findings: AIMS: Facial and Oral Movements Muscles of Facial Expression: None, normal, ,  ,  , Dental Status Current problems with teeth and/or dentures?: No Does patient usually wear dentures?: No  CIWA:    COWS:     Musculoskeletal: Strength & Muscle Tone: within normal limits Gait & Station: normal Patient leans: N/A  Psychiatric Specialty Exam: Physical Exam  Nursing note and vitals reviewed.   Review of Systems   Psychiatric/Behavioral: Positive for hallucinations.  All other systems reviewed and are negative.   Blood pressure 121/87, pulse 67, temperature 97.8 F (36.6 C), temperature source Oral, resp. rate 18, height 6' (1.829 m), weight 91.627 kg (202 lb), SpO2 100 %.Body mass index is 27.39 kg/(m^2).  General Appearance: Casual  Eye Contact:  Good  Speech:  Clear and Coherent  Volume:  Normal  Mood:  Euthymic  Affect:  Blunt  Thought Process:  Goal Directed  Orientation:  Full (Time, Place, and Person)  Thought Content:  WDL  Suicidal Thoughts:  No  Homicidal Thoughts:  No  Memory:  Immediate;   Fair Recent;   Fair Remote;   Fair  Judgement:  Impaired  Insight:  Shallow  Psychomotor Activity:  Normal  Concentration:  Concentration: Fair and Attention Span: Fair  Recall:  [employer]  Fund of Knowledge:  Fair  Language:  Fair  Akathisia:  No  Handed:  Right  AIMS (if indicated):     Assets:  Communication Skills Desire for Improvement Financial Resources/Insurance Housing Physical Health Resilience Social Support  ADL's:  Intact  Cognition:  WNL  Sleep:  Number of Hours: 8     Treatment Plan Summary: Daily contact with patient to assess and evaluate symptoms and progress in treatment and Medication management  Ryan York is a 45 year old male with history of schizoaffective disorder admitted for worsening auditory hallucinations commanding him to be a homosexual and paranoid delusions causing him to lose his housing.  1. Psychosis. We continue Seroquel at 800 mg, apparently outpatient dose, for mood stabilization and psychosis.  2. Agitation. Resolved.   3. Allergies. We continued Singulair.  4. Insomnia. Trazodone was available.  5. Metabolic syndrome screening. Lipid profile, TSH, hemoglobin A1c are normal. Prolactin 31.   6. Disposition. The patient will be discharged with his parents. He will follow-up with [facility].   Kristine LineaJolanta Carmela Piechowski, MD 04/09/2016, 6:39  PM

## 2016-04-09 NOTE — Progress Notes (Signed)
[facility] MD Progress Note  04/09/2016 12:26 PM CAS TRACZ  MRN:  [id number]  Subjective:  Mr. Mancel Bale continues to improve. He is no longer preoccupied with fears of becoming gay or living within city limits. He however is still paranoid about his apartment and does not wish to return there. We spoke with his parents for to accept him home but requested a family meeting to set the rules. The family is very supportive and the mother is a payee. They tried to respect his independence but it is clear now that the patient has not been compliant with medications in the community. He still refuses injectable antipsychotic. There are no somatic complaints. He takes Seroquel as prescribed and tolerates it well. Good group participation..  Principal Problem: Schizoaffective disorder, bipolar type (HCC) Diagnosis:   Patient Active Problem List   Diagnosis Date Noted  . Tobacco use disorder [F17.200] 04/04/2016  . Schizoaffective disorder, bipolar type (HCC) [F25.0] 04/20/2012   Total Time spent with patient: 20 minutes  Past Psychiatric History: Schizoaffective disorder.  Past Medical History:  Past Medical History  Diagnosis Date  . Bipolar disorder (HCC)   . Schizophrenia (HCC)   . Asthma   . Hypertension     Past Surgical History  Procedure Laterality Date  . Knee surgery     Family History: History reviewed. No pertinent family history. Family Psychiatric  History: See H&P. Social History:  History  Alcohol Use No    Comment: Pt stated that he rarely consumes alcohol     History  Drug Use No    Comment: Pt denies     Social History   Social History  . Marital Status: Single    Spouse Name: N/A  . Number of Children: N/A  . Years of Education: N/A   Social History Main Topics  . Smoking status: Current Some Day Smoker -- 0.50 packs/day for 10 years    Types: Cigarettes, Cigars  . Smokeless tobacco: Never Used  . Alcohol Use: No     Comment: Pt stated that he rarely  consumes alcohol  . Drug Use: No     Comment: Pt denies   . Sexual Activity: Yes    Birth Control/ Protection: None   Other Topics Concern  . None   Social History Narrative   Additional Social History:    History of alcohol / drug use?: No history of alcohol / drug abuse                    Sleep: Fair  Appetite:  Fair  Current Medications: Current Facility-Administered Medications  Medication Dose Route Frequency Provider Last Rate Last Dose  . acetaminophen (TYLENOL) tablet 650 mg  650 mg Oral Q6H PRN Janiah Devinney B Adib Wahba, MD      . alum & mag hydroxide-simeth (MAALOX/MYLANTA) 200-200-20 MG/5ML suspension 30 mL  30 mL Oral Q4H PRN Jarrel Knoke B Lashauna Arpin, MD      . magnesium hydroxide (MILK OF MAGNESIA) suspension 30 mL  30 mL Oral Daily PRN Vonda Harth B Haruko Mersch, MD      . montelukast (SINGULAIR) tablet 10 mg  10 mg Oral QHS Shari Prows, MD   10 mg at 04/08/16 2121  . nicotine (NICODERM CQ - dosed in mg/24 hours) patch 21 mg  21 mg Transdermal Q0600 Shari Prows, MD   21 mg at 04/05/16 0919  . QUEtiapine (SEROQUEL XR) 24 hr tablet 800 mg  800 mg Oral QHS Shari Prows, MD  800 mg at 04/08/16 2120  . QUEtiapine (SEROQUEL) tablet 100 mg  100 mg Oral TID PRN Elayah Klooster B Montana Bryngelson, MD      . traZODone (DESYREL) tablet 100 mg  100 mg Oral QHS PRN Danniel Tones B Kameren Baade, MD        Lab Results: No results found for this or any previous visit (from the past 48 hour(s)).  Blood Alcohol level:  Lab Results  Component Value Date   ETH <5 04/03/2016   ETH <5 04/01/2016    Metabolic Disorder Labs: Lab Results  Component Value Date   HGBA1C 5.8 04/05/2016   Lab Results  Component Value Date   PROLACTIN 31.3* 04/05/2016   Lab Results  Component Value Date   CHOL 163 04/05/2016   TRIG 107 04/05/2016   HDL 37* 04/05/2016   CHOLHDL 4.4 04/05/2016   VLDL 21 04/05/2016   LDLCALC 105* 04/05/2016    Physical Findings: AIMS: Facial and Oral  Movements Muscles of Facial Expression: None, normal, ,  ,  , Dental Status Current problems with teeth and/or dentures?: No Does patient usually wear dentures?: No  CIWA:    COWS:     Musculoskeletal: Strength & Muscle Tone: within normal limits Gait & Station: normal Patient leans: N/A  Psychiatric Specialty Exam: Physical Exam  Nursing note and vitals reviewed.   Review of Systems  Psychiatric/Behavioral: Positive for hallucinations.  All other systems reviewed and are negative.   Blood pressure 121/87, pulse 67, temperature 97.8 F (36.6 C), temperature source Oral, resp. rate 18, height 6' (1.829 m), weight 91.627 kg (202 lb), SpO2 100 %.Body mass index is 27.39 kg/(m^2).  General Appearance: Fairly Groomed  Eye Contact:  Good  Speech:  Clear and Coherent  Volume:  Normal  Mood:  Dysphoric  Affect:  Congruent  Thought Process:  Disorganized  Orientation:  Full (Time, Place, and Person)  Thought Content:  Delusions, Hallucinations: Auditory and Paranoid Ideation  Suicidal Thoughts:  No  Homicidal Thoughts:  No  Memory:  Immediate;   Fair Recent;   Fair Remote;   Fair  Judgement:  Poor  Insight:  Lacking  Psychomotor Activity:  Normal  Concentration:  Concentration: Fair and Attention Span: Fair  Recall:  [employer]Fair  Fund of Knowledge:  Fair  Language:  Fair  Akathisia:  No  Handed:  Right  AIMS (if indicated):     Assets:  Communication Skills Desire for Improvement Financial Resources/Insurance Housing Physical Health Resilience Social Support  ADL's:  Intact  Cognition:  WNL  Sleep:  Number of Hours: 8     Treatment Plan Summary: Daily contact with patient to assess and evaluate symptoms and progress in treatment and Medication management   Mr. Tobey GrimHawley is a 45 year old male with history of schizoaffective disorder admitted for worsening auditory hallucinations commanding him to be a homosexual.  1. Psychosis. We continue Seroquel at 800 mg, apparently  outpatient dose, for mood stabilization and psychosis.  2. Agitation. Resolved.   3. Allergies. We'll continue Singulair.  4. Insomnia. Trazodone is available.  5. Metabolic syndrome screening. Lipid profile, TSH, hemoglobin A1c are normal. Prolactin 31.   6. Disposition. The patient is homeless. He will follow-up with [facility].   Kristine LineaJolanta Jeremias Broyhill, MD 04/09/2016, 12:26 PM

## 2016-04-09 NOTE — Plan of Care (Signed)
Problem: Coping: Goal: Ability to demonstrate self-control will improve Outcome: Progressing No behavior issues noted

## 2016-04-10 MED ORDER — QUETIAPINE FUMARATE ER 400 MG PO TB24: 800.0000 mg | ORAL_TABLET | Freq: Every day | ORAL | Status: DC

## 2016-04-10 MED ORDER — TRAZODONE HCL 100 MG PO TABS: 100.0000 mg | ORAL_TABLET | Freq: Every evening | ORAL | Status: DC | PRN

## 2016-04-10 NOTE — Plan of Care (Signed)
Problem: Activity: Goal: Interest or engagement in activities will improve Outcome: Progressing Pt attended group and played outside with peers.

## 2016-04-10 NOTE — [facility] Suicide Risk Assessment (Signed)
[facility]   Principal Problem: Schizoaffective disorder, bipolar type [facility](HCC) Discharge Diagnoses:  Patient Active Problem List   Diagnosis Date Noted  . Tobacco use disorder [F17.200] 04/04/2016  . Schizoaffective disorder, bipolar type (HCC) [F25.0] 04/20/2012    Total Time spent with patient: 30 minutes  Musculoskeletal: Strength & Muscle Tone: within normal limits Gait & Station: normal Patient leans: N/A  Psychiatric Specialty Exam: Review of Systems  All other systems reviewed and are negative.   Blood pressure 110/88, pulse 74, temperature 97.5 F (36.4 C), temperature source Oral, resp. rate 18, height 6' (1.829 m), weight 91.627 kg (202 lb), SpO2 100 %.Body mass index is 27.39 kg/(m^2).  General Appearance: Casual  Eye Contact::  Good  Speech:  Clear and Coherent409  Volume:  Normal  Mood:  Euthymic  Affect:  Blunt  Thought Process:  Goal Directed  Orientation:  Full (Time, Place, and Person)  Thought Content:  WDL  Suicidal Thoughts:  No  Homicidal Thoughts:  No  Memory:  Immediate;   Fair Recent;   Fair Remote;   Fair  Judgement:  Impaired  Insight:  Shallow  Psychomotor Activity:  Normal  Concentration:  Fair  Recall:  [employer]Fair  Fund of Knowledge:Fair  Language: Fair  Akathisia:  No  Handed:  Right  AIMS (if indicated):     Assets:  Communication Skills Desire for Improvement Financial Resources/Insurance Housing Physical Health Resilience Social Support  Sleep:  Number of Hours: 8  Cognition: WNL  ADL's:  Intact   Mental Status Per Nursing Assessment::   On Admission:  NA  Demographic Factors:  NA  Loss Factors: NA  Historical Factors: Impulsivity  Risk Reduction Factors:   Sense of responsibility to family, Living with another person, especially a relative, Positive social support and Positive therapeutic relationship  Continued Clinical Symptoms:  Schizophrenia:   Paranoid or undifferentiated  type  Cognitive Features That Contribute To Risk:  None    Suicide Risk:  Minimal: No identifiable suicidal ideation.  Patients presenting with no risk factors but with morbid ruminations; may be classified as minimal risk based on the severity of the depressive symptoms  Follow-up Information    Follow up with [facility]  On 04/17/2016.   Why:  Your hospital follow up appointment will be on Thursday July 20th at 12:00 with your therapist.    Contact information:   [address],  [place], [place] [address] Phone: [phone] Fax: [phone]      Follow up with [facility]  On 05/12/2016.   Why:  You have a medication management appointment on Monday August 14th at 10:15am. Your therapist is working to move up your appointment as soon as possible.    Contact information:   [address],  [place], [place] [address] Phone: [phone] Fax: [phone]      Plan Of Care/Follow-up recommendations:  Activity:  as tolerated. Diet:  regular. Other:  keep follow up appointment.  Kristine LineaJolanta Monico Sudduth, MD 04/10/2016, 6:00 PM

## 2016-04-10 NOTE — Progress Notes (Signed)
D: Observed pt in dayroom interacting with peers. Patient alert and oriented x4. Patient denies SI/HI/VH. Pt endorsed VH of "talking trash, disrespecting my manhood", but pt indicated that the voices are improving. Pt affect is flat. Pt went outside, interacted with peers and attended group. Pt did endorse some paranoid delusion about [place] Pt rated depression 0/10 and anxiety 5/10. Pt indicated that most of anxiety was related to concerns with finances. Pt indicated he was looking forward to discharge.  A: Offered active listening and support. Provided therapeutic communication. Administered scheduled medications.  R: Pt pleasant and cooperative. Pt medication compliant. Will continue Q15 min. checks. Safety maintained.

## 2016-04-10 NOTE — [facility] Group Notes (Signed)
[facility] LCSW Group Therapy   04/10/2016 1pm   Type of Therapy: Group Therapy   Participation Level: Active   Participation Quality: Attentive, Sharing and Supportive   Affect: Appropriate   Cognitive: Alert and Oriented   Insight: Developing/Improving and Engaged   Engagement in Therapy: Developing/Improving and Engaged   Modes of Intervention: Clarification, Confrontation, Discussion, Education, Exploration, Limit-setting, Orientation, Problem-solving, Rapport Building, Dance movement psychotherapisteality Testing, Socialization and Support   Summary of Progress/Problems: The topic for group was balance in life. Today's group focused on defining balance in one's own words, identifying things that can knock one off balance, and exploring healthy ways to maintain balance in life. Group members were asked to provide an example of a time when they felt off balance, describe how they handled that situation, and process healthier ways to regain balance in the future. Group members were asked to share the most important tool for maintaining balance that they learned [facility] and how they plan to apply this method after discharge. Pt shared the pt feels off balance when he is hospitalized and hopes to learn how to talk to others and hopes others do the same for the pt.  Pt shared the pt hopes to get "better". Pt shared the pt's most important tool is my "ability to talk and think". Pt reports the pt intends to do this by talking to others and learning from others.

## 2016-04-10 NOTE — [facility] Group Notes (Signed)
[facility] Group Notes:  (Nursing/MHT/Case Management/Adjunct)  Date:  04/10/2016  Time:  4:02 PM  Type of Therapy:  Psychoeducational Skills  Participation Level:  Minimal  Participation Quality:  Appropriate and Sharing  Affect:  Appropriate and Flat  Cognitive:  Appropriate  Insight:  Limited  Engagement in Group:  Poor  Modes of Intervention:  Discussion, Education and Support  Summary of Progress/Problems:  Lynelle SmokeCara Travis Halimah Bewick 04/10/2016, 4:02 PM

## 2016-04-10 NOTE — Progress Notes (Signed)
Pt pleasant and cooperative with care. Med and group compliant. Excited about plans for discharge on tomorrow. Denies SI, HI, Visual hallucinations. Endorses hearing voices.  Encouragement and support offered. Pt receptive and remains safe on unit with q 15 min checks.

## 2016-04-10 NOTE — Plan of Care (Signed)
Problem: Education: Goal: Emotional status will improve Outcome: Progressing Pleasant and cooperative, noted smiling, attending group

## 2016-04-10 NOTE — Discharge Summary (Signed)
Physician Discharge Summary Note  Patient:  Ryan York is an 45 y.o., male MRN:  [id number] DOB:  August 30, 1971 Patient phone:  [phone] (home)  Patient address:   [address] [place] [address],  Total Time spent with patient: 30 minutes  Date of Admission:  04/04/2016 Date of Discharge: 04/11/2016  Reason for Admission:  Psychotic break.  Identifying data. Mr. Viets is a 45 year old male of schizoaffective disorder.  Chief complaint. "I need to be out of city limits."  History of present illness. Information was obtained from the patient and the chart. The patient has a long history of psychosis and mood over 30 prior psychiatric admissions by his own account. He has been in the care of [employer] in [place]. He has been prescribed Seroquel. He reports good treatment compliance. He started experiencing increased auditory hallucinations that are "catching him" when he is within city limits. E needs to be "in [facility]" to feel safe. The voices are commanding him to "jerk off" and become a homosexual. He denies any symptoms of depression, or anxiety but he is very irritable and easily agitated. He got upset with his nurse. He gives me very limited information He was masturbating when I walked into his room.He denies alcohol or illicit substance use.  Past psychiatric history. Multiple psychiatric admissions. He does not remember any other medications that Seroquel He denies ever attempting suicide.  Family psychiatric history. He denies any.  Social history. He claims to be homeless. He is disabled from mental illness. He has health insurance.  Principal Problem: Schizoaffective disorder, bipolar type [facility]) Discharge Diagnoses: Patient Active Problem List   Diagnosis Date Noted  . Tobacco use disorder [F17.200] 04/04/2016  . Schizoaffective disorder, bipolar type (HCC) [F25.0] 04/20/2012   Past Medical History:  Past Medical History  Diagnosis Date   . Bipolar disorder (HCC)   . Schizophrenia (HCC)   . Asthma   . Hypertension     Past Surgical History  Procedure Laterality Date  . Knee surgery     Family History: History reviewed. No pertinent family history.  Social History:  History  Alcohol Use No    Comment: Pt stated that he rarely consumes alcohol     History  Drug Use No    Comment: Pt denies     Social History   Social History  . Marital Status: Single    Spouse Name: N/A  . Number of Children: N/A  . Years of Education: N/A   Social History Main Topics  . Smoking status: Current Some Day Smoker -- 0.50 packs/day for 10 years    Types: Cigarettes, Cigars  . Smokeless tobacco: Never Used  . Alcohol Use: No     Comment: Pt stated that he rarely consumes alcohol  . Drug Use: No     Comment: Pt denies   . Sexual Activity: Yes    Birth Control/ Protection: None   Other Topics Concern  . None   Social History Narrative    Hospital Course:    Mr. Sprinkle is a 45 year old male with history of schizoaffective disorder admitted for worsening auditory hallucinations commanding him to be a homosexual and paranoid delusions causing him to lose his housing.  1. Psychosis. We continued Seroquel at 800 mg, apparently outpatient dose, for mood stabilization and psychosis. The patient was offered long lasting antipsychotic to improve compliance but refused injections.  2. Agitation. Resolved.   3. Allergies. We continued Singulair.  4. Insomnia. Trazodone  was available.  5. Metabolic syndrome screening. Lipid profile, TSH, hemoglobin A1c are normal. Prolactin 31.   6. Disposition. The patient was discharged with his parents. He will follow-up with [facility].   Physical Findings: AIMS: Facial and Oral Movements Muscles of Facial Expression: None, normal, ,  ,  , Dental Status Current problems with teeth and/or dentures?: No Does patient usually wear dentures?: No  CIWA:    COWS:      Musculoskeletal: Strength & Muscle Tone: within normal limits Gait & Station: normal Patient leans: N/A  Psychiatric Specialty Exam: Physical Exam  Nursing note and vitals reviewed.   Review of Systems  All other systems reviewed and are negative.   Blood pressure 110/88, pulse 74, temperature 97.5 F (36.4 C), temperature source Oral, resp. rate 18, height 6' (1.829 m), weight 91.627 kg (202 lb), SpO2 100 %.Body mass index is 27.39 kg/(m^2).  See SRA.                                                  Sleep:  Number of Hours: 8     Have you used any form of tobacco in the last 30 days? (Cigarettes, Smokeless Tobacco, Cigars, and/or Pipes): Yes  Has this patient used any form of tobacco in the last 30 days? (Cigarettes, Smokeless Tobacco, Cigars, and/or Pipes) Yes, No  Blood Alcohol level:  Lab Results  Component Value Date   [facility] <5 04/03/2016   ETH <5 04/01/2016    Metabolic Disorder Labs:  Lab Results  Component Value Date   HGBA1C 5.8 04/05/2016   Lab Results  Component Value Date   PROLACTIN 31.3* 04/05/2016   Lab Results  Component Value Date   CHOL 163 04/05/2016   TRIG 107 04/05/2016   HDL 37* 04/05/2016   CHOLHDL 4.4 04/05/2016   VLDL 21 04/05/2016   LDLCALC 105* 04/05/2016    See Psychiatric Specialty Exam and Suicide Risk Assessment completed by Attending Physician prior to discharge.  Discharge destination:  Home  Is patient on multiple antipsychotic therapies at discharge:  No   Has Patient had three or more failed trials of antipsychotic monotherapy by history:  No  Recommended Plan for Multiple Antipsychotic Therapies: NA  Discharge Instructions    Diet - low sodium heart healthy    Complete by:  As directed      Increase activity slowly    Complete by:  As directed             Medication List    STOP taking these medications        OLANZapine 2.5 MG tablet  Commonly known as:  ZYPREXA      TAKE these  medications      Indication   montelukast 10 MG tablet  Commonly known as:  SINGULAIR  Take 10 mg by mouth daily as needed. For allergies.      QUEtiapine 400 MG 24 hr tablet  Commonly known as:  SEROQUEL XR  Take 2 tablets (800 mg total) by mouth at bedtime.   Indication:  Schizophrenia     traZODone 100 MG tablet  Commonly known as:  DESYREL  Take 1 tablet (100 mg total) by mouth at bedtime as needed for sleep.   Indication:  Trouble Sleeping           Follow-up Information  Follow up with [facility]  On 04/17/2016.   Why:  Your hospital follow up appointment will be on Thursday July 20th at 12:00 with your therapist.    Contact information:   [address]  [place], [place] [address] Phone: [phone] Fax: [phone]      Follow up with [facility]  On 05/12/2016.   Why:  You have a medication management appointment on Monday August 14th at 10:15am. Your therapist is working to move up your appointment as soon as possible.    Contact information:   [address],  [place], [place] [address] Phone: [phone] Fax: [phone]      Follow-up recommendations:  Activity:  as tolerated. Diet:  regular. Other:  keep follow up appointment.  Comments:    Signed: [name], MD 04/10/2016, 6:04 PM

## 2016-04-10 NOTE — [facility] Group Notes (Signed)
Goals Group Date/Time: 04/10/16 9am Type of Therapy and Topic: Group Therapy: Goals Group: SMART Goals   Participation Level: Moderate  Description of Group:    The purpose of a daily goals group is to assist and guide patients in setting recovery/wellness-related goals. The objective is to set goals as they relate to the crisis in which they were admitted. Patients will be using SMART goal modalities to set measurable goals. Characteristics of realistic goals will be discussed and patients will be assisted in setting and processing how one will reach their goal. Facilitator will also assist patients in applying interventions and coping skills learned in psycho-education groups to the SMART goal and process how one will achieve defined goal.   Therapeutic Goals:   -Patients will develop and document one goal related to or their crisis in which brought them into treatment.  -Patients will be guided by LCSW using SMART goal setting modality in how to set a measurable, attainable, realistic and time sensitive goal.  -Patients will process barriers in reaching goal.  -Patients will process interventions in how to overcome and successful in reaching goal.   Patient's Goal: Pt shared the pt's goal is to "be better than i am".  Pt's affect was blunted but pt presented as calm.  Pt was cooperative and polite.   Therapeutic Modalities:  Motivational Interviewing  Research officer, political partyCognitive Behavioral Therapy  Crisis Intervention Model  SMART goals setting   Dorothe PeaJonathan F. Truong Delcastillo, LCSWA, LCAS

## 2016-04-10 NOTE — Progress Notes (Signed)
Recreation Therapy Notes  Date: 07.13.17 Time: 9:30 am Location: Craft Room  Group Topic: Leisure Education  Goal Area(s) Addresses:  Patient will identify activities for each letter of the alphabet. Patient will verbalize ability to use leisure as a Associate Professorcoping skill.  Behavioral Response: Did not attend  Intervention: Leisure Alphabet  Activity: Patients were given a Leisure Information systems managerAlphabet worksheet and instructed to identify a leisure activity for each letter of the alphabet.   Education: LRT educated patients on what they need to participate in leisure.  Education Outcome: Patient did not attend group.   Clinical Observations/Feedback: Patient did not attend group.  Jacquelynn CreeGreene,Santosha Jividen M, LRT/CTRS 04/10/2016 10:14 AM

## 2016-04-10 NOTE — [facility] Group Notes (Signed)
[facility] Group Notes:  (Nursing/MHT/Case Management/Adjunct)  Date:  04/10/2016  Time:  3:31 AM  Type of Therapy:  Psychoeducational Skills  Participation Level:  Active  Participation Quality:  Appropriate  Affect:  Appropriate  Cognitive:  Appropriate  Insight:  Appropriate  Engagement in Group:  Engaged  Modes of Intervention:  Discussion, Socialization and Support  Summary of Progress/Problems:  Ryan MilroyLaquanda Y Corrigan York 04/10/2016, 3:31 AM

## 2016-04-11 NOTE — Progress Notes (Signed)
  [facility] Discharge Plan :  Will you be returning to the same living situation after discharge:  Yes,  home At discharge, do you have transportation home?: Yes,  family  Do you have the ability to [facility] for your medications: [facility]  Release of information consent forms completed and in the chart;  Patient's signature needed at discharge.  Patient to Follow up at: Follow-up Information    Follow up with [facility]  On 04/17/2016.   Why:  Your hospital follow up appointment will be on Thursday July 20th at 12:00 with your therapist.    Contact information:   [address],  [place], [place] [address] Phone: [phone] Fax: [phone]      Follow up with [facility]  On 05/12/2016.   Why:  You have a medication management appointment on Monday August 14th at 10:15am. Your therapist is working to move up your appointment as soon as possible.    Contact information:   [address],  [place], [place] [address] Phone: [phone] Fax: [phone]      Next level of care provider has [facility] to [facility] Link:no  Safety Planning and Suicide Prevention discussed: Yes,  insurance, income   Have you used any form of tobacco in the last 30 days? (Cigarettes, Smokeless Tobacco, Cigars, and/or Pipes): Yes  Has patient been referred to the Quitline?: Patient refused referral  Patient has been referred for addiction treatment: N/A  Ryan York MSW, LCSWA  04/11/2016, 1:22 PM

## 2016-04-11 NOTE — Progress Notes (Signed)
D: Patient was still only taking 600 mg. He states he still has the AH. Patient appears less paranoid. He has been visible in the milieu playing cards with other peers. Denies Pain.  A: Medication was given with education. Encouragement was provided.  R: Patient was compliant with 600 mg of of seroquel. He has remained calm and cooperative. Safety maintained with 15 min checks.

## 2016-04-11 NOTE — Progress Notes (Signed)
Recreation Therapy Notes  Date: 07.14.17 Time: 9:30 am Location: Craft Room  Group Topic: Coping Skills  Goal Area(s) Addresses:  Patient will participate in healthy coping skill. Patient will verbalize one emotion experienced during group.  Behavioral Response: Attentive, Interactive  Intervention: Coloring  Activity: Patients were given coloring sheets and instructed to color while thinking about the emotions they were feeling and what their mind was focused on.  Education: LRT educated patients on healthy coping skills.  Education Outcome: Acknowledges education/In group clarification offered   Clinical Observations/Feedback: Patient colored coloring sheet. Patient contributed to group discussion by stating what emotions he felt while he was coloring.  Jacquelynn CreeGreene,Arieona Swaggerty M, LRT/CTRS 04/11/2016 10:30 AM

## 2016-04-11 NOTE — Progress Notes (Signed)
D/C instructions/transition record/suicide risk assessment/meds/follow-up appointments reviewed and given to patient, pt verbalized understanding, pt's belongings returned to pt, denies SI/HI/AVH. 

## 2016-04-11 NOTE — Tx Team (Signed)
Interdisciplinary Treatment Plan Update (Adult)  Date:  04/11/2016 Time Reviewed:  1:27 PM  Progress in Treatment: Attending groups: Yes. Participating in groups:  Yes. Taking medication as prescribed:  Yes. Tolerating medication:  Yes. Family/Significant othe contact made:  Yes, individual(s) contacted:  mother  Patient understands diagnosis:  No. and As evidenced by:  limited insight  Discussing patient identified problems/goals with staff:  Yes. Medical problems stabilized or resolved:  Yes. Denies suicidal/homicidal ideation: Yes. Issues/concerns per patient self-inventory:  Yes. Other:  New problem(s) identified: No, Describe:  NA  Discharge Plan or Barriers: Pt plans to return home and follow up with outpatient.    Reason for Continuation of Hospitalization: Delusions  Hallucinations Medication stabilization  Comments:Ryan York denies depression, anxiety, or psychosis. He is not suicidal or homicidal. He tolerates medications well. He adamantly refuses injectable antipsychotics. There are no somtic complaints. Sleep and appetite are fair.  Estimated length of stay: Pt will likely d/c today.   New goal(s): NA   Review of initial/current patient goals per problem list:   1.  Goal(s): Patient will participate in aftercare plan * Met: Yes  * Target date: at discharge * As evidenced by: Patient will participate within aftercare plan AEB aftercare provider and housing plan at discharge being identified  2.  Goal (s): Patient will demonstrate decreased symptoms of psychosis. * Met: Yes *  Target date: at discharge * As evidenced by: Patient will not endorse signs of psychosis or be deemed stable for discharge by MD.   Attendees: Patient:  Ryan York 7/14/20171:27 PM  Family:   7/14/20171:27 PM  Physician:   Dr. Bary Leriche  7/14/20171:27 PM  Nursing:   Ritta Slot  7/14/20171:27 PM  Case Manager:   7/14/20171:27 PM  Counselor:   7/14/20171:27 PM  Other:   Wray Kearns, LCSWA 7/14/20171:27 PM  Other:  Everitt Amber, Talpa  7/14/20171:27 PM  Other:   7/14/20171:27 PM  Other:  7/14/20171:27 PM  Other:  7/14/20171:27 PM  Other:  7/14/20171:27 PM  Other:  7/14/20171:27 PM  Other:  7/14/20171:27 PM  Other:  7/14/20171:27 PM  Other:   7/14/20171:27 PM   Scribe for Treatment Team:   Wray Kearns, MSW, [place]  04/11/2016, 1:27 PM

## 2016-04-11 NOTE — [facility] Group Notes (Signed)
[facility] Group Notes:  (Nursing/MHT/Case Management/Adjunct)  Date:  04/11/2016  Time:  2:19 AM  Type of Therapy:  Group Therapy  Participation Level:  Active  Participation Quality:  Attentive  Affect:  Appropriate  Cognitive:  Alert and Disorganized  Insight:  Limited  Engagement in Group:  Engaged  Modes of Intervention:  Discussion  Summary of Progress/Problems: Pt stated that he accomplished his goal of drinking a lot of water. Staff asked pt. Why this was his goal. Pt stated that [place] and [place]. Pt said that this has kept him calm today.    Fanny Skatesshley Imani Winnifred Dufford 04/11/2016, 2:19 AM

## 2016-04-11 NOTE — Plan of Care (Signed)
Problem: Activity: Goal: Will identify at least one activity in which they can participate Outcome: Progressing Patient was playing cards with peers.

## 2016-04-18 ENCOUNTER — Emergency Department (HOSPITAL_COMMUNITY)
Admission: EM | Admit: 2016-04-18 | Discharge: 2016-04-19 | Disposition: A | Discharge status: Home / Self Care | Payer: Medicaid Other | Attending: Emergency Medicine | Admitting: Emergency Medicine

## 2016-04-18 ENCOUNTER — Encounter (HOSPITAL_COMMUNITY): Payer: Self-pay | Admitting: *Deleted

## 2016-04-18 DIAGNOSIS — I10 Essential (primary) hypertension: Secondary | ICD-10-CM | POA: Insufficient documentation

## 2016-04-18 DIAGNOSIS — Z5181 Encounter for therapeutic drug level monitoring: Secondary | ICD-10-CM | POA: Diagnosis not present

## 2016-04-18 DIAGNOSIS — F259 Schizoaffective disorder, unspecified: Secondary | ICD-10-CM | POA: Diagnosis not present

## 2016-04-18 DIAGNOSIS — Z046 Encounter for general psychiatric examination, requested by authority: Secondary | ICD-10-CM | POA: Diagnosis present

## 2016-04-18 DIAGNOSIS — J45909 Unspecified asthma, uncomplicated: Secondary | ICD-10-CM | POA: Diagnosis not present

## 2016-04-18 DIAGNOSIS — F250 Schizoaffective disorder, bipolar type: Secondary | ICD-10-CM | POA: Diagnosis present

## 2016-04-18 DIAGNOSIS — F319 Bipolar disorder, unspecified: Secondary | ICD-10-CM | POA: Insufficient documentation

## 2016-04-18 DIAGNOSIS — F17210 Nicotine dependence, cigarettes, uncomplicated: Secondary | ICD-10-CM | POA: Diagnosis not present

## 2016-04-18 LAB — CBC
HEMATOCRIT: 40 % (ref 39.0–52.0)
Hemoglobin: 14 g/dL (ref 13.0–17.0)
MCH: 33.3 pg (ref 26.0–34.0)
MCHC: 35 g/dL (ref 30.0–36.0)
MCV: 95 fL (ref 78.0–100.0)
Platelets: 170 10*3/uL (ref 150–400)
RBC: 4.21 MIL/uL — ABNORMAL LOW (ref 4.22–5.81)
RDW: 12.9 % (ref 11.5–15.5)
WBC: 8 10*3/uL (ref 4.0–10.5)

## 2016-04-18 LAB — ETHANOL

## 2016-04-18 LAB — COMPREHENSIVE METABOLIC PANEL
ALT: 34 U/L (ref 17–63)
AST: 21 U/L (ref 15–41)
Albumin: 4.5 g/dL (ref 3.5–5.0)
Alkaline Phosphatase: 78 U/L (ref 38–126)
Anion gap: 7 (ref 5–15)
BUN: 22 mg/dL — ABNORMAL HIGH (ref 6–20)
CHLORIDE: 108 mmol/L (ref 101–111)
CO2: 25 mmol/L (ref 22–32)
CREATININE: 1.05 mg/dL (ref 0.61–1.24)
Calcium: 8.9 mg/dL (ref 8.9–10.3)
Glucose, Bld: 93 mg/dL (ref 65–99)
POTASSIUM: 3.6 mmol/L (ref 3.5–5.1)
Sodium: 140 mmol/L (ref 135–145)
Total Bilirubin: 0.3 mg/dL (ref 0.3–1.2)
Total Protein: 8 g/dL (ref 6.5–8.1)

## 2016-04-18 LAB — RAPID URINE DRUG SCREEN, HOSP PERFORMED
Amphetamines: NOT DETECTED
BARBITURATES: NOT DETECTED
BENZODIAZEPINES: NOT DETECTED
COCAINE: NOT DETECTED
OPIATES: NOT DETECTED
Tetrahydrocannabinol: NOT DETECTED

## 2016-04-18 MED ORDER — ONDANSETRON HCL 4 MG PO TABS: 4.0000 mg | ORAL_TABLET | Freq: Three times a day (TID) | ORAL | Status: DC | PRN

## 2016-04-18 MED ORDER — LORAZEPAM 1 MG PO TABS
1.0000 mg | ORAL_TABLET | Freq: Three times a day (TID) | ORAL | Status: DC | PRN
  Filled 2016-04-18: qty 1

## 2016-04-18 MED ORDER — IBUPROFEN 200 MG PO TABS: 600.0000 mg | ORAL_TABLET | Freq: Three times a day (TID) | ORAL | Status: DC | PRN

## 2016-04-18 MED ORDER — TRAZODONE HCL 100 MG PO TABS
100.0000 mg | ORAL_TABLET | Freq: Every evening | ORAL | Status: DC | PRN
  Filled 2016-04-18: qty 1

## 2016-04-18 MED ORDER — OLANZAPINE 10 MG PO TBDP
10.0000 mg | ORAL_TABLET | Freq: Three times a day (TID) | ORAL | Status: DC | PRN
  Administered 2016-04-18: 10 mg via ORAL
  Filled 2016-04-18: qty 1

## 2016-04-18 MED ORDER — ZOLPIDEM TARTRATE 5 MG PO TABS: 5.0000 mg | ORAL_TABLET | Freq: Every evening | ORAL | Status: DC | PRN

## 2016-04-18 MED ORDER — ACETAMINOPHEN 325 MG PO TABS
650.0000 mg | ORAL_TABLET | ORAL | Status: DC | PRN
  Administered 2016-04-18: 650 mg via ORAL
  Filled 2016-04-18: qty 2

## 2016-04-18 MED ORDER — NICOTINE 21 MG/24HR TD PT24: 21.0000 mg | MEDICATED_PATCH | Freq: Every day | TRANSDERMAL | Status: DC

## 2016-04-18 MED ORDER — OLANZAPINE 5 MG PO TBDP
5.0000 mg | ORAL_TABLET | Freq: Once | ORAL | Status: DC
  Filled 2016-04-18: qty 1

## 2016-04-18 MED ORDER — QUETIAPINE FUMARATE ER 400 MG PO TB24
800.0000 mg | ORAL_TABLET | Freq: Every day | ORAL | Status: DC
  Administered 2016-04-18: 800 mg via ORAL
  Filled 2016-04-18: qty 2

## 2016-04-18 MED ORDER — MONTELUKAST SODIUM 10 MG PO TABS
10.0000 mg | ORAL_TABLET | Freq: Every day | ORAL | Status: DC
  Administered 2016-04-19: 10 mg via ORAL
  Filled 2016-04-18 (×2): qty 1

## 2016-04-18 MED ORDER — ALUM & MAG HYDROXIDE-SIMETH 200-200-20 MG/5ML PO SUSP: 30.0000 mL | ORAL | Status: DC | PRN

## 2016-04-18 MED ORDER — TRAZODONE HCL 100 MG PO TABS: 100.0000 mg | ORAL_TABLET | Freq: Every evening | ORAL | Status: DC | PRN

## 2016-04-18 NOTE — ED Notes (Signed)
Requested and received prn Zyprexa for anxiety.

## 2016-04-18 NOTE — ED Notes (Signed)
Pt continues to stand in doorway and complain about the city. Rambles in conversation, "this city is on the alert to flood" "hope it does"

## 2016-04-18 NOTE — BH Assessment (Signed)
[facility]  Clinician discussed patient care with Akintayo MD who recommended inpatient psychiatric care.

## 2016-04-18 NOTE — ED Notes (Signed)
Pt has been told twice not to masturbate due to other people in department has to pass doorway.

## 2016-04-18 NOTE — BH Assessment (Addendum)
Assessment Note  Ryan York is an 45 y.o. male that presents this date with active AVH and some passive H/I (threats). Patient states he "constantly hears voices telling him to hurt city people." Patient has no plan on "how" he is going to hurt people but keeps seeing himself "knocking someone out." Patient presents with a very agitated affect and is sexually fixed constantly talking about his penis and certain sex acts. Patient states his current medications "are f**king his penis up." He states that he continues to hear voices that "tell him to be a homosexual." Patient continues to talk about masturbating and becomes agitated when this writer attempts to redirect him. Patient states he is receiving medications from [facility] and reported current compliance although patient states "they stopped working." Patient is requesting an inpatient admission to keep from hurting people and adjust his medications. Patient is asking to be admitted to [facility] as this writer explained to patient that staff has to investigate bed availability at multiple hospitals. Patient agreed to a voluntary admission at accepting hospital but as mentioned, prefers [facility]. Patient has had multiple admission in 2017 with [facility] and [facility]. Patient denies any current SA use and denies any current legal. Patient continues to talk about cell towers and radiation affecting him in [place].Patient commits to ongoing AVH and states he "sees white people in uniforms that are trying to hurt him." Clinician discussed patient care with Akintayo MD who recommended inpatient psychiatric care.    Diagnosis: Schizoaffective disorder, bipolar type   Past Medical History:  Past Medical History  Diagnosis Date  . Bipolar disorder (HCC)   . Schizophrenia (HCC)   . Asthma   . Hypertension     Past Surgical History  Procedure Laterality Date  . Knee surgery      Family History: No family history on file.  Social History:  reports that he  has been smoking Cigarettes and Cigars.  He has a 5 pack-year smoking history. He has never used smokeless tobacco. He reports that he does not drink alcohol or use illicit drugs.  Additional Social History:  Alcohol / Drug Use Pain Medications: See MAR Prescriptions: See MAr Over the Counter: See MAR History of alcohol / drug use?: No history of alcohol / drug abuse  CIWA: CIWA-Ar BP: (!) 148/106 mmHg Pulse Rate: 68 COWS:    Allergies:  Allergies  Allergen Reactions  . Vistaril [Hydroxyzine] Nausea Only    Severe stomach pain    Home Medications:  (Not in a hospital admission)  OB/GYN Status:  No LMP for male patient.  General Assessment Data Location of Assessment: [facility] ED TTS Assessment: In system Is this a Tele or Face-to-Face Assessment?: Face-to-Face Is this an Initial Assessment or a Re-assessment for this encounter?: Initial Assessment Marital status: Single Maiden name: na Is patient pregnant?: No Pregnancy Status: No Living Arrangements: Other (Comment) Can pt return to current living arrangement?: Yes Admission Status: Voluntary Is patient capable of signing voluntary admission?: Yes Referral Source: Self/Family/Friend Insurance type: Medicaid  Medical Screening Exam [facility] Walk-in ONLY) Medical Exam completed: Yes  Crisis Care Plan Living Arrangements: Other (Comment) Legal Guardian: Other: (na) Name of Psychiatrist: [facility] Name of Therapist: [facility]  Education Status Is patient currently in school?: No Current Grade: na Highest grade of school patient has completed: 12th grade Name of school: na Contact person: na  Risk to self with the past 6 months Suicidal Ideation: No Has patient been a risk to self within  the past 6 months prior to admission? : No Suicidal Intent: No Has patient had any suicidal intent within the past 6 months prior to admission? : No Is patient at risk for suicide?: No Suicidal Plan?: No Has patient had any  suicidal plan within the past 6 months prior to admission? : No Access to Means: No What has been your use of drugs/alcohol within the last 12 months?: denies Previous Attempts/Gestures: No (per patient) How many times?: 0 Other Self Harm Risks: none Triggers for Past Attempts: None known Intentional Self Injurious Behavior: None Family Suicide History: No Recent stressful life event(s): Other (Comment) (housing issues) Persecutory voices/beliefs?: No Depression: Yes Depression Symptoms: Loss of interest in usual pleasures, Despondent Substance abuse history and/or treatment for substance abuse?: No Suicide prevention information given to non-admitted patients: Not applicable  Risk to Others within the past 6 months Homicidal Ideation: No (but is angry and is making threats) Does patient have any lifetime risk of violence toward others beyond the six months prior to admission? : No Thoughts of Harm to Others: Yes-Currently Present (passive) Comment - Thoughts of Harm to Others: may harm others pt states, tired of people  Current Homicidal Intent: No Current Homicidal Plan: No Access to Homicidal Means: No Identified Victim: na History of harm to others?: No Assessment of Violence: None Noted Violent Behavior Description: na Does patient have access to weapons?: No Criminal Charges Pending?: No Does patient have a court date: No Is patient on probation?: No  Psychosis Hallucinations: Visual, Auditory Delusions: None noted  Mental Status Report Appearance/Hygiene: In scrubs Eye Contact: Fair Motor Activity: Freedom of movement Speech: Pressured Level of Consciousness: Alert Mood: Anxious, Angry Affect: Anxious Anxiety Level: Moderate Thought Processes: Flight of Ideas Judgement: Impaired Orientation: Person, Place Obsessive Compulsive Thoughts/Behaviors: None  Cognitive Functioning Concentration: Decreased Memory: Recent Intact IQ: Average Insight: Fair Impulse  Control: Fair Appetite: Fair Weight Loss: 0 Weight Gain: 0 Sleep: No Change Total Hours of Sleep: 7 Vegetative Symptoms: None  ADLScreening [facility]) Patient's cognitive ability adequate to safely complete daily activities?: Yes Patient able to express need for assistance with ADLs?: Yes Independently performs ADLs?: Yes (appropriate for developmental age)  Prior Inpatient Therapy Prior Inpatient Therapy: Yes Prior Therapy Dates: 2010, 2012, 2013 Prior Therapy Facilty/Provider(s): [facility]  Reason for Treatment: Schizoaffective disorder   Prior Outpatient Therapy Prior Outpatient Therapy: Yes Prior Therapy Dates: Last 3 years Prior Therapy Facilty/Provider(s): [facility] Reason for Treatment: mental halth Does patient have an ACCT team?: No Does patient have Intensive In-House Services?  : No Does patient have [facility] services? : No Does patient have P4CC services?: No  ADL Screening (condition at time of admission) Patient's cognitive ability adequate to safely complete daily activities?: Yes Is the patient deaf or have difficulty hearing?: No Does the patient have difficulty seeing, even when wearing glasses/contacts?: No Does the patient have difficulty concentrating, remembering, or making decisions?: Yes Patient able to express need for assistance with ADLs?: Yes Does the patient have difficulty dressing or bathing?: No Independently performs ADLs?: Yes (appropriate for developmental age) Does the patient have difficulty walking or climbing stairs?: No Weakness of Legs: None Weakness of Arms/Hands: None  Home Assistive Devices/Equipment Home Assistive Devices/Equipment: None  Therapy Consults (therapy consults require a physician order) PT Evaluation Needed: No OT Evalulation Needed: No SLP Evaluation Needed: No Abuse/Neglect Assessment (Assessment to be complete while patient is alone) Physical Abuse: Denies Verbal Abuse: Denies Sexual Abuse:  Denies Exploitation of  patient/patient's resources: Denies Self-Neglect: Denies Values / Beliefs Cultural Requests During Hospitalization: None Spiritual Requests During Hospitalization: None Consults Spiritual Care Consult Needed: No Social Work Consult Needed: No Merchant navy officerAdvance Directives (For Healthcare) Does patient have an advance directive?: No Would patient like information on creating an advanced directive?: No - patient declined information (pt declined information)    Additional Information 1:1 In Past 12 Months?: No CIRT Risk: No Elopement Risk: No Does patient have medical clearance?: Yes     Disposition: Clinician discussed patient care with Akintayo MD who recommended inpatient psychiatric care.    Disposition Initial Assessment Completed for this Encounter: Yes Disposition of Patient: Inpatient treatment program Type of inpatient treatment program: Adult  On Site Evaluation by:   Reviewed with Physician:    Alfredia Fergusonavid L Sina Lucchesi 04/18/2016 11:50 AM

## 2016-04-18 NOTE — ED Notes (Signed)
Patient arrived on the unit ambulatory.  He is irritable on first encounter and stated he just wanted a place to lie down and masturbate.  He was shown to his room and I explained that he was on camera while in the room.  He requested to have his television on, which was done.  He is currently lying quietly in his room.

## 2016-04-18 NOTE — ED Notes (Addendum)
Patient was seen in the hallway with no bottoms on.  He was directed back to his room and it was requested that he not come into the hallway unless he is dressed.  He did go back into his room upon request and has himself covered.

## 2016-04-18 NOTE — ED Notes (Signed)
Pt refused Zyprexa "It F**ks my dick up"

## 2016-04-18 NOTE — ED Notes (Signed)
Patient is loud, and sexually inappropriate.   Refusing any medications except "seroquel at night" Difficult to redirect.  [facility]  [employer]security informed.

## 2016-04-18 NOTE — ED Provider Notes (Signed)
CSN: [id number]     Arrival date & time 04/18/16  [id number] History   First MD Initiated Contact with Patient 04/18/16 0703     Chief Complaint  Patient presents with  . Medical Clearance     (Consider location/radiation/quality/duration/timing/severity/associated sxs/prior Treatment) HPI Ryan York is a 45 y.o. male with history of bipolar disorder, schizophrenia, asthma, hypertension, presents to emergency department with complaint of hearing voices and hallucinations. Patient is rumbling for most of the interview, he does admit to hearing voices. He states he hears voices. He also states that the cell phone towers of [place] and the radiation are "touching me and causing me to masturbate." Patient is angry, states "I'm tired of this sorry city full of white blue-eyed people." Patient adds "[place] Patient states that he does not want any medicated but states he needs help and wants to get out [place] [facility]. Patient is not taking his medications.  Past Medical History  Diagnosis Date  . Bipolar disorder (HCC)   . Schizophrenia (HCC)   . Asthma   . Hypertension    Past Surgical History  Procedure Laterality Date  . Knee surgery     No family history on file. Social History  Substance Use Topics  . Smoking status: Current Some Day Smoker -- 0.50 packs/day for 10 years    Types: Cigarettes, Cigars  . Smokeless tobacco: Never Used  . Alcohol Use: No     Comment: Pt stated that he rarely consumes alcohol    Review of Systems  Constitutional: Negative for fever and chills.  Respiratory: Negative for cough, chest tightness and shortness of breath.   Cardiovascular: Negative for chest pain, palpitations and leg swelling.  Musculoskeletal: Negative for myalgias, arthralgias, neck pain and neck stiffness.  Skin: Negative for rash.  Allergic/Immunologic: Negative for immunocompromised state.  Neurological: Negative  for dizziness, weakness, light-headedness, numbness and headaches.  Psychiatric/Behavioral: Positive for hallucinations, behavioral problems and agitation. The patient is nervous/anxious.   All other systems reviewed and are negative.     Allergies  Vistaril  Home Medications   Prior to Admission medications   Medication Sig Start Date End Date Taking? Authorizing Provider  montelukast (SINGULAIR) 10 MG tablet Take 10 mg by mouth daily as needed. For allergies. 01/07/16   Historical Provider, MD  QUEtiapine (SEROQUEL XR) 400 MG 24 hr tablet Take 2 tablets (800 mg total) by mouth at bedtime. 04/10/16   Shari ProwsJolanta B Pucilowska, MD  traZODone (DESYREL) 100 MG tablet Take 1 tablet (100 mg total) by mouth at bedtime as needed for sleep. 04/10/16   [name] B Pucilowska, MD   BP 148/106 mmHg  Pulse 68  Temp(Src) 98.2 F (36.8 C)  Resp 20  SpO2 95% Physical Exam  Constitutional: He appears well-developed and well-nourished. No distress.  HENT:  Head: Normocephalic and atraumatic.  Eyes: Conjunctivae are normal.  Neck: Neck supple.  Cardiovascular: Normal rate, regular rhythm and normal heart sounds.   Pulmonary/Chest: Effort normal. No respiratory distress. He has no wheezes. He has no rales.  Musculoskeletal: He exhibits no edema.  Neurological: He is alert.  Skin: Skin is warm and dry.  Psychiatric: He has a normal mood and affect. His speech is rapid and/or pressured. He is agitated, aggressive, hyperactive and actively hallucinating. Thought content is paranoid and delusional. Cognition and memory are impaired.  Nursing note and vitals reviewed.   ED Course  Procedures (including critical care time) Labs Review  Labs Reviewed  COMPREHENSIVE METABOLIC PANEL  ETHANOL  CBC  URINE RAPID DRUG SCREEN, HOSP PERFORMED    Imaging Review No results found. I have personally reviewed and evaluated these images and lab results as part of my medical decision-making.   EKG  Interpretation None      MDM   Final diagnoses:  None    Patient in emergency department with hallucinations and delusions, he is agitated. He is alert and oriented. He has been here in the past for the same complaints. Will get a medical screening labs and TTS consult   The patient is medically cleared. Pending TTS assessment. Patient refused any medications, but became slightly agitated, stating that he doesn't trust medications because "it makes my dick shrink." Patient requested me to measure his penis in order to receive medications, also asked me to give him a penile massage. Explained to him that we do not do that in emergency department. Patient is calm and cooperative.   Jaynie Crumble, PA-C 04/19/16 1436  Lavera Guise, MD 04/19/16 [phone]

## 2016-04-18 NOTE — ED Notes (Signed)
Pt states he is hearing voices, "tired of this sorry city" "Zombies" "I want to go somewhere the sex is friendly" pt rambling, " I am tired of dealing with blue eyed white people" " [facility]"

## 2016-04-18 NOTE — ED Notes (Signed)
Pt refused Ativan "I want someone to come measure my penis"

## 2016-04-18 NOTE — BH Assessment (Signed)
[facility] Assessment Progress Note  Per [name], NP, this pt requires psychiatric hospitalization at this time.  The following facilities have been contacted to seek placement for this pt, with results as noted:  Beds available, information sent, decision pending:  Ryan York   Declined:  Ryan York (reason unspecified)   At capacity:  [facility]   [name], [place] Triage Specialist [phone]

## 2016-04-19 DIAGNOSIS — F250 Schizoaffective disorder, bipolar type: Secondary | ICD-10-CM

## 2016-04-19 NOTE — ED Notes (Signed)
Pt discharged home per MD order. Pt denies SI/HI. Discharge summary reviewed. Pt verbalizes understanding of discharge summary. Pt signed for personal belongings and property returned. Pt ambulatory off unit.

## 2016-04-19 NOTE — [facility] Suicide Risk Assessment (Signed)
Suicide Risk Assessment  Discharge Assessment   [facility] Discharge Suicide Risk Assessment   Principal Problem: Schizoaffective disorder, bipolar type [facility]) Discharge Diagnoses:  Patient Active Problem List   Diagnosis Date Noted  . Tobacco use disorder [F17.200] 04/04/2016  . Schizoaffective disorder, bipolar type (HCC) [F25.0] 04/20/2012    Total Time spent with patient: 15 minutes  Musculoskeletal: Strength & Muscle Tone: within normal limits Gait & Station: normal Patient leans: N/A  Psychiatric Specialty Exam:   Blood pressure 133/68, pulse 57, temperature 97.8 F (36.6 C), temperature source Oral, resp. rate 18, SpO2 99 %.There is no weight on file to calculate BMI.   General Appearance: Fairly Groomed  Eye Contact:  Good  Speech:  Clear and Coherent and Normal Rate  Volume:  Normal  Mood:  Anxious  Affect:  Congruent  Thought Process:  Coherent  Orientation:  Full (Time, Place, and Person)  Thought Content:  Hallucinations: Auditory  Suicidal Thoughts:  No  Homicidal Thoughts:  No  Memory:  Immediate;   Fair Recent;   Fair Remote;   Fair  Judgement:  Fair  Insight:  Fair  Psychomotor Activity:  Normal  Concentration:  Concentration: Fair and Attention Span: Fair  Recall:  [employer] of Knowledge:  Fair  Language:  Fair  Akathisia:  No  Handed:  Right  AIMS (if indicated):     Assets:  Communication Skills Desire for Improvement Housing Physical Health Resilience  ADL's:  Intact  Cognition:  WNL  Sleep:      Mental Status Per Nursing Assessment::   On Admission:     Demographic Factors:  Male, Low socioeconomic status and Unemployed  Loss Factors: NA  Historical Factors: Impulsivity  Risk Reduction Factors:   Living with another person, especially a relative, Positive social support and Connected to ACT team  Continued Clinical Symptoms:  Schizophrenia  Cognitive Features That Contribute To Risk:  None    Suicide Risk:  Minimal: No  identifiable suicidal ideation.  Patients presenting with no risk factors but with morbid ruminations; may be classified as minimal risk based on the severity of the depressive symptoms  Follow-up Information    Follow up with follow up with your current provider [facility]. Call in 2 days.      Plan Of Care/Follow-up recommendations:  Activity:  As tolerated Diet:  Regular Tests:  As determined by PCP Other:  Follow up with your outpatient provider [facility]  1.Take all your medications as prescribed.  2. Report any adverse side effects to your medication to your outpatient provider. 3. Do not use alcohol or illegal drugs while taking prescription medications. 4. In the event of worsening symptoms, call 911, the crisis hotline or go to nearest emergency room for evaluation of symptoms.  Alberteen Sam, FNP-BC [facility] 04/19/2016, 12:25 PM

## 2016-04-19 NOTE — Consult Note (Signed)
[facility] Consult   Reason for Consult:  Psychiatric Evaluation Referring Physician:  EDP Patient Identification: Ryan York MRN:  [id number] Principal Diagnosis: Schizoaffective disorder, bipolar type [facility]) Diagnosis:   Patient Active Problem List   Diagnosis Date Noted  . Tobacco use disorder [F17.200] 04/04/2016  . Schizoaffective disorder, bipolar type ([place]) [F25.0] 04/20/2012    Total Time spent with patient: 45 minutes  Subjective:   Ryan York is a 45 y.o. male patient who states "I got into an argument with my mom and dad."  HPI:  Ryan York is an 45 y.o. African American male who presented to [place] ED on 04/18/16 for evaluation of active auditory hallucinations and passive H/I (threats). He stated he "constantly hears voices telling him to hurt city people." Patient stated he had no plan but that he kept seeing himself "knocking someone out." He was very agitated on admission as well as hypersexual (constantly talking about his penis and masturbating.) He stated part of his agitation was that his medications  "are f**king his penis up." He stated he continues to hear voices that "tell him to be a homosexual."  He also feels the cell towers in [place] are giving him radiation.   He is seen face-to-face with Dr. Darleene Cleaver. The patient is alert, calm, oriented and cooperative. He states he receives medication management through [facility] and that he has been compliant with his medications. He denies illicit drug use, stating "I only smoke cigars;" his UDS is negative. He denies alcohol use; BAL < 5.   He states he was recently hospitalized at [facility] July, 2017. He currently denies suicidal ideation, intent or plan. He denies suicidal ideation, intent or plan.   Past Psychiatric History: Schizoaffective disorder  Risk to Self: Suicidal Ideation: No Suicidal Intent: No Is patient at risk for suicide?:  No Suicidal Plan?: No Access to Means: No What has been your use of drugs/alcohol within the last 12 months?: denies How many times?: 0 Other Self Harm Risks: none Triggers for Past Attempts: None known Intentional Self Injurious Behavior: None Risk to Others: Homicidal Ideation: No (but is angry and is making threats) Thoughts of Harm to Others: Yes-Currently Present (passive) Comment - Thoughts of Harm to Others: may harm others pt states, tired of people  Current Homicidal Intent: No Current Homicidal Plan: No Access to Homicidal Means: No Identified Victim: na History of harm to others?: No Assessment of Violence: None Noted Violent Behavior Description: na Does patient have access to weapons?: No Criminal Charges Pending?: No Does patient have a court date: No Prior Inpatient Therapy: Prior Inpatient Therapy: Yes Prior Therapy Dates: 2010, 2012, 2013 Prior Therapy Facilty/Provider(s): [facility]  Reason for Treatment: Schizoaffective disorder  Prior Outpatient Therapy: Prior Outpatient Therapy: Yes Prior Therapy Dates: Last 3 years Prior Therapy Facilty/Provider(s): [facility] Reason for Treatment: mental halth Does patient have an ACCT team?: No Does patient have Intensive In-House Services?  : No Does patient have [facility] services? : No Does patient have P4CC services?: No  Past Medical History:  Past Medical History  Diagnosis Date  . Bipolar disorder ([place])   . Schizophrenia ([place])   . Asthma   . Hypertension     Past Surgical History  Procedure Laterality Date  . Knee surgery     Family History: No family history on file. Family Psychiatric  History: unknown Social History:  History  Alcohol Use No    Comment: Pt stated  that he rarely consumes alcohol     History  Drug Use No    Comment: Pt denies     Social History   Social History  . Marital Status: Single    Spouse Name: N/A  . Number of Children: N/A  . Years of Education: N/A   Social  History Main Topics  . Smoking status: Current Some Day Smoker -- 0.50 packs/day for 10 years    Types: Cigarettes, Cigars  . Smokeless tobacco: Never Used  . Alcohol Use: No     Comment: Pt stated that he rarely consumes alcohol  . Drug Use: No     Comment: Pt denies   . Sexual Activity: Yes    Birth Control/ Protection: None   Other Topics Concern  . None   Social History Narrative   Additional Social History:    Allergies:   Allergies  Allergen Reactions  . Vistaril [Hydroxyzine] Nausea Only    Severe stomach pain    Labs:  Results for orders placed or performed during the hospital encounter of 04/18/16 (from the past 48 hour(s))  Comprehensive metabolic panel     Status: Abnormal   Collection Time: 04/18/16  7:38 AM  Result Value Ref Range   Sodium 140 135 - 145 mmol/L   Potassium 3.6 3.5 - 5.1 mmol/L   Chloride 108 101 - 111 mmol/L   CO2 25 22 - 32 mmol/L   Glucose, Bld 93 65 - 99 mg/dL   BUN 22 (H) 6 - 20 mg/dL   Creatinine, Ser 1.05 0.61 - 1.24 mg/dL   Calcium 8.9 8.9 - 10.3 mg/dL   Total Protein 8.0 6.5 - 8.1 g/dL   Albumin 4.5 3.5 - 5.0 g/dL   AST 21 15 - 41 U/L   ALT 34 17 - 63 U/L   Alkaline Phosphatase 78 38 - 126 U/L   Total Bilirubin 0.3 0.3 - 1.2 mg/dL   GFR calc non Af Amer >60 >60 mL/min   GFR calc Af Amer >60 >60 mL/min    Comment: (NOTE) The eGFR has been calculated using the CKD EPI equation. This calculation has not been validated in all clinical situations. eGFR's persistently <60 mL/min signify possible Chronic Kidney Disease.    Anion gap 7 5 - 15  Ethanol     Status: None   Collection Time: 04/18/16  7:38 AM  Result Value Ref Range   Alcohol, Ethyl (B) <5 <5 mg/dL    Comment:        LOWEST DETECTABLE LIMIT FOR SERUM ALCOHOL IS 5 mg/dL FOR MEDICAL PURPOSES ONLY   cbc     Status: Abnormal   Collection Time: 04/18/16  7:38 AM  Result Value Ref Range   WBC 8.0 4.0 - 10.5 K/uL   RBC 4.21 (L) 4.22 - 5.81 MIL/uL   Hemoglobin 14.0  13.0 - 17.0 g/dL   HCT 40.0 39.0 - 52.0 %   MCV 95.0 78.0 - 100.0 fL   MCH 33.3 26.0 - 34.0 pg   MCHC 35.0 30.0 - 36.0 g/dL   RDW 12.9 11.5 - 15.5 %   Platelets 170 150 - 400 K/uL  Rapid urine drug screen (hospital performed)     Status: None   Collection Time: 04/18/16 11:38 AM  Result Value Ref Range   Opiates NONE DETECTED NONE DETECTED   Cocaine NONE DETECTED NONE DETECTED   Benzodiazepines NONE DETECTED NONE DETECTED   Amphetamines NONE DETECTED NONE DETECTED   Tetrahydrocannabinol NONE DETECTED  NONE DETECTED   Barbiturates NONE DETECTED NONE DETECTED    Comment:        DRUG SCREEN FOR MEDICAL PURPOSES ONLY.  IF CONFIRMATION IS NEEDED FOR ANY PURPOSE, NOTIFY LAB WITHIN 5 DAYS.        LOWEST DETECTABLE LIMITS FOR URINE DRUG SCREEN Drug Class       Cutoff (ng/mL) Amphetamine      1000 Barbiturate      200 Benzodiazepine   235 Tricyclics       573 Opiates          300 Cocaine          300 THC              50     Current Facility-Administered Medications  Medication Dose Route Frequency Provider Last Rate Last Dose  . acetaminophen (TYLENOL) tablet 650 mg  650 mg Oral Q4H PRN Tatyana Kirichenko, PA-C   650 mg at 04/18/16 1704  . alum & mag hydroxide-simeth (MAALOX/MYLANTA) 200-200-20 MG/5ML suspension 30 mL  30 mL Oral PRN Tatyana Kirichenko, PA-C      . ibuprofen (ADVIL,MOTRIN) tablet 600 mg  600 mg Oral Q8H PRN Tatyana Kirichenko, PA-C      . LORazepam (ATIVAN) tablet 1 mg  1 mg Oral Q8H PRN Tatyana Kirichenko, PA-C   1 mg at 04/18/16 0859  . montelukast (SINGULAIR) tablet 10 mg  10 mg Oral Q1200 Leondre Taul, MD   10 mg at 04/19/16 1121  . nicotine (NICODERM CQ - dosed in mg/24 hours) patch 21 mg  21 mg Transdermal Daily Tatyana Kirichenko, PA-C   21 mg at 04/18/16 1355  . OLANZapine zydis (ZYPREXA) disintegrating tablet 10 mg  10 mg Oral Q8H PRN Corena Pilgrim, MD   10 mg at 04/18/16 1704  . ondansetron (ZOFRAN) tablet 4 mg  4 mg Oral Q8H PRN Tatyana Kirichenko,  PA-C      . QUEtiapine (SEROQUEL XR) 24 hr tablet 800 mg  800 mg Oral QHS Randalyn Ahmed, MD   800 mg at 04/18/16 2129  . traZODone (DESYREL) tablet 100 mg  100 mg Oral QHS PRN Corena Pilgrim, MD      . traZODone (DESYREL) tablet 100 mg  100 mg Oral QHS PRN Corena Pilgrim, MD       Current Outpatient Prescriptions  Medication Sig Dispense Refill  . montelukast (SINGULAIR) 10 MG tablet Take 10 mg by mouth daily as needed. For allergies.  0  . QUEtiapine (SEROQUEL XR) 400 MG 24 hr tablet Take 2 tablets (800 mg total) by mouth at bedtime. 60 tablet 1  . traZODone (DESYREL) 100 MG tablet Take 1 tablet (100 mg total) by mouth at bedtime as needed for sleep. 30 tablet 1    Musculoskeletal: Strength & Muscle Tone: within normal limits Gait & Station: normal Patient leans: N/A  Psychiatric Specialty Exam: Physical Exam  Constitutional: He is oriented to person, place, and time. He appears well-developed and well-nourished.  HENT:  Head: Normocephalic.  Neck: Normal range of motion.  Respiratory: Effort normal.  Musculoskeletal: Normal range of motion.  Neurological: He is alert and oriented to person, place, and time.  Skin: Skin is warm and dry.  Psychiatric:  See psychiatric specialty exam    Review of Systems  Constitutional: Negative.   HENT: Negative.   Eyes: Negative.   Respiratory: Negative.   Gastrointestinal: Negative.   Genitourinary: Negative.   Musculoskeletal: Negative.   Skin: Negative.   Neurological: Negative.   Psychiatric/Behavioral:  Negative for depression and suicidal ideas.    Blood pressure 133/68, pulse 57, temperature 97.8 F (36.6 C), temperature source Oral, resp. rate 18, SpO2 99 %.There is no weight on file to calculate BMI.  General Appearance: Fairly Groomed  Eye Contact:  Good  Speech:  Clear and Coherent and Normal Rate  Volume:  Normal  Mood:  Anxious  Affect:  Congruent  Thought Process:  Coherent  Orientation:  Full (Time, Place, and  Person)  Thought Content:  Hallucinations: Auditory  Suicidal Thoughts:  No  Homicidal Thoughts:  No  Memory:  Immediate;   Fair Recent;   Fair Remote;   Fair  Judgement:  Fair  Insight:  Fair  Psychomotor Activity:  Normal  Concentration:  Concentration: Fair and Attention Span: Fair  Recall:  [employer] of Knowledge:  Fair  Language:  Fair  Akathisia:  No  Handed:  Right  AIMS (if indicated):     Assets:  Communication Skills Desire for Improvement Housing Physical Health Resilience  ADL's:  Intact  Cognition:  WNL  Sleep:       Treatment Plan Summary: Case discussed with Dr. Darleene Cleaver. Recommendations are: Plan : Discharge home  Continue home medications. Rxs for Seroquel and Trazodone given at discharge.  Follow up with outpatient provider, [facility].   Disposition: No evidence of imminent risk to self or others at present.   Patient does not meet criteria for psychiatric inpatient admission. Supportive therapy provided about ongoing stressors. Discussed crisis plan, support from social network, calling 911, coming to the Emergency Department, and calling Suicide Hotline.  Serena Colonel, FNP-BC [place] 04/19/2016 12:25 PM  Patient seen face-to-face for psychiatric evaluation, chart reviewed and case discussed with the physician extender and developed treatment plan. Reviewed the information documented and agree with the treatment plan. Corena Pilgrim, MD

## 2016-04-20 ENCOUNTER — Emergency Department (HOSPITAL_COMMUNITY)
Admission: EM | Admit: 2016-04-20 | Discharge: 2016-04-21 | Disposition: A | Discharge status: Home / Self Care | Payer: Medicaid Other | Attending: Emergency Medicine | Admitting: Emergency Medicine

## 2016-04-20 ENCOUNTER — Encounter (HOSPITAL_COMMUNITY): Payer: Self-pay

## 2016-04-20 ENCOUNTER — Emergency Department (HOSPITAL_COMMUNITY)
Admission: EM | Admit: 2016-04-20 | Discharge: 2016-04-20 | Disposition: A | Discharge status: Home / Self Care | Payer: Medicaid Other

## 2016-04-20 DIAGNOSIS — F22 Delusional disorders: Secondary | ICD-10-CM | POA: Diagnosis not present

## 2016-04-20 DIAGNOSIS — J45909 Unspecified asthma, uncomplicated: Secondary | ICD-10-CM | POA: Diagnosis not present

## 2016-04-20 DIAGNOSIS — F250 Schizoaffective disorder, bipolar type: Secondary | ICD-10-CM | POA: Insufficient documentation

## 2016-04-20 DIAGNOSIS — Z79899 Other long term (current) drug therapy: Secondary | ICD-10-CM | POA: Insufficient documentation

## 2016-04-20 DIAGNOSIS — R440 Auditory hallucinations: Secondary | ICD-10-CM | POA: Diagnosis present

## 2016-04-20 DIAGNOSIS — F17210 Nicotine dependence, cigarettes, uncomplicated: Secondary | ICD-10-CM | POA: Insufficient documentation

## 2016-04-20 DIAGNOSIS — R443 Hallucinations, unspecified: Secondary | ICD-10-CM

## 2016-04-20 DIAGNOSIS — I10 Essential (primary) hypertension: Secondary | ICD-10-CM | POA: Insufficient documentation

## 2016-04-20 LAB — COMPREHENSIVE METABOLIC PANEL
ALK PHOS: 80 U/L (ref 38–126)
ALT: 26 U/L (ref 17–63)
AST: 21 U/L (ref 15–41)
Albumin: 4.5 g/dL (ref 3.5–5.0)
Anion gap: 6 (ref 5–15)
BILIRUBIN TOTAL: 0.6 mg/dL (ref 0.3–1.2)
BUN: 21 mg/dL — AB (ref 6–20)
CALCIUM: 8.8 mg/dL — AB (ref 8.9–10.3)
CHLORIDE: 105 mmol/L (ref 101–111)
CO2: 27 mmol/L (ref 22–32)
CREATININE: 1.23 mg/dL (ref 0.61–1.24)
GFR calc Af Amer: >60 mL/min (ref >60)
Glucose, Bld: 106 mg/dL — ABNORMAL HIGH (ref 65–99)
Potassium: 3.8 mmol/L (ref 3.5–5.1)
Sodium: 138 mmol/L (ref 135–145)
TOTAL PROTEIN: 7.6 g/dL (ref 6.5–8.1)

## 2016-04-20 LAB — RAPID URINE DRUG SCREEN, HOSP PERFORMED
AMPHETAMINES: NOT DETECTED
Barbiturates: NOT DETECTED
Benzodiazepines: NOT DETECTED
Cocaine: NOT DETECTED
Opiates: NOT DETECTED
TETRAHYDROCANNABINOL: NOT DETECTED

## 2016-04-20 LAB — CBC
HCT: 40.9 % (ref 39.0–52.0)
Hemoglobin: 14.4 g/dL (ref 13.0–17.0)
MCH: 33.6 pg (ref 26.0–34.0)
MCHC: 35.2 g/dL (ref 30.0–36.0)
MCV: 95.3 fL (ref 78.0–100.0)
PLATELETS: 156 10*3/uL (ref 150–400)
RBC: 4.29 MIL/uL (ref 4.22–5.81)
RDW: 12.8 % (ref 11.5–15.5)
WBC: 7.6 10*3/uL (ref 4.0–10.5)

## 2016-04-20 LAB — ACETAMINOPHEN LEVEL: Acetaminophen (Tylenol), Serum: <10 ug/mL — ABNORMAL LOW (ref 10–30)

## 2016-04-20 LAB — ETHANOL

## 2016-04-20 LAB — SALICYLATE LEVEL: Salicylate Lvl: <4 mg/dL (ref 2.8–30.0)

## 2016-04-20 MED ORDER — ALUM & MAG HYDROXIDE-SIMETH 200-200-20 MG/5ML PO SUSP: 30.0000 mL | ORAL | Status: DC | PRN

## 2016-04-20 MED ORDER — ONDANSETRON HCL 4 MG PO TABS: 4.0000 mg | ORAL_TABLET | Freq: Three times a day (TID) | ORAL | Status: DC | PRN

## 2016-04-20 MED ORDER — ACETAMINOPHEN 325 MG PO TABS
650.0000 mg | ORAL_TABLET | ORAL | Status: DC | PRN
  Administered 2016-04-21: 650 mg via ORAL
  Filled 2016-04-20: qty 2

## 2016-04-20 MED ORDER — LORAZEPAM 1 MG PO TABS: 1.0000 mg | ORAL_TABLET | Freq: Three times a day (TID) | ORAL | Status: DC | PRN

## 2016-04-20 MED ORDER — ZOLPIDEM TARTRATE 5 MG PO TABS: 5.0000 mg | ORAL_TABLET | Freq: Every evening | ORAL | Status: DC | PRN

## 2016-04-20 MED ORDER — OLANZAPINE 10 MG PO TBDP
10.0000 mg | ORAL_TABLET | Freq: Three times a day (TID) | ORAL | Status: DC | PRN
  Administered 2016-04-20: 10 mg via ORAL
  Filled 2016-04-20: qty 1

## 2016-04-20 NOTE — ED Notes (Signed)
After his arrival to [facility], Patient was provided with a Personal Belongings document.  During this time, Patient made inappropriate statements, such as "I love your juicy lips," in addition to touching MHT's hand as she obtained documents he signed.

## 2016-04-20 NOTE — ED Notes (Signed)
Patient noted sleeping in room. No complaints, stable, in no acute distress. Q15 minute rounds and monitoring via Security Cameras to continue.  

## 2016-04-20 NOTE — ED Notes (Signed)
Attempted to give patient 1 mg ativan and he refused.  Patient stated, "that mess make my dick shrivel up.  You got a ruler?  You need to measure it and see."  Patient continues to be inappropriate sexually with staff.  He is yelling and upsetting the other patients over his meal tray.  Patient is a vegetarian and cannot understand why each time he is here we have to reorder a tray.  Police and security present.  Patient agreed to take zyprexa 10 mg after being told that he would be discharged.  He is voluntary and if he doesn't take his medication, the MD will discharge him home.

## 2016-04-20 NOTE — Progress Notes (Signed)
Disposition CSW completed patient referrals to the following inpatient psych facilities:  [facility]  CSW will continue to follow patient for placement needs.  [facility] Disposition CSW [phone]

## 2016-04-20 NOTE — ED Notes (Signed)
Report given to Diane, RN w/ [facility].

## 2016-04-20 NOTE — ED Notes (Signed)
Report from Laronica RN. Patient sleeping, respirations regular and unlabored. Q15 minute rounds and security camera observation to continue.   

## 2016-04-20 NOTE — ED Notes (Signed)
Patient recently was admitted to the Psy-ED unit, as result his lunch was unavailable.  Staff began to contact dietary services to obtain a modified vegeterian lunch tray for Patient.  During this time, Patient began to yell and make various inappropriate statements to staff (i.e. "Suck my dick," "You fat ass bitch," "He's a faggot," etc.).  Various attempts were made to have Patient lower his voice and return to his room, however Patient refused.  Patient continued to yell and make inappropriate statements after meal accommodations were made.

## 2016-04-20 NOTE — ED Notes (Addendum)
Pt came out of the room demanding a wash cloth and soap, so he could "wash his ass."  Pt got mad when he was informed that we do not have wash cloths in the restroom.  Pt asked "I have to take a shit.  How do I wipe my ass?"  Pt informed that we have toilet paper for those reasons.  Pt asked for this RN's name which was provided.  Pt began to make derogatory comments toward this RN including calling me a "redneck," "racist," and stating that I need "to not sleep with so many women and have sex with a man."  Pt "fired" this Charity fundraiser.  Charge RN made aware.

## 2016-04-20 NOTE — ED Provider Notes (Signed)
[facility]-EMERGENCY DEPT Provider Note   CSN: [id number] Arrival date & time: 04/20/16  1017  First Provider Contact:  None       History   Chief Complaint Chief Complaint  Patient presents with  . Hallucinations    HPI Ryan York is a 45 y.o. male.  HPI Pt presents w/ [employer]. C/o auditory hallucinations and HI. Sts the voices are telling him to "vandalize things and start fights." Denies SI. Pt reports that he has been taking his medications, but the "cell towers are interfering."  Past Medical History:  Diagnosis Date  . Asthma   . Bipolar disorder (H[facility])   . Hypertension   . Schizophrenia [facility])     Patient Active Problem List   Diagnosis Date Noted  . Tobacco use disorder 04/04/2016  . Schizoaffective disorder, bipolar type ([facility]) 04/20/2012    Past Surgical History:  Procedure Laterality Date  . KNEE SURGERY         Home Medications    Prior to Admission medications   Medication Sig Start Date End Date Taking? Authorizing Provider  montelukast (SINGULAIR) 10 MG tablet Take 10 mg by mouth daily as needed. For allergies. 01/07/16  Yes Historical Provider, MD  QUEtiapine (SEROQUEL XR) 400 MG 24 hr tablet Take 2 tablets (800 mg total) by mouth at bedtime. 04/10/16  Yes [name] B Pucilowska, MD  traZODone (DESYREL) 100 MG tablet Take 1 tablet (100 mg total) by mouth at bedtime as needed for sleep. 04/10/16  Yes Shari Prows, MD    Family History History reviewed. No pertinent family history.  Social History Social History  Substance Use Topics  . Smoking status: Current Some Day Smoker    Packs/day: 0.50    Years: 10.00    Types: Cigarettes, Cigars  . Smokeless tobacco: Never Used  . Alcohol use No     Comment: Pt stated that he rarely consumes alcohol     Allergies   Vistaril [hydroxyzine]   Review of Systems Review of Systems  Unable to perform ROS: Psychiatric disorder     Physical Exam Updated Vital Signs BP 112/76 (BP Location:  Right Arm)   Pulse 77   Temp 98.2 F (36.8 C) (Oral)   Resp 18   SpO2 96%   Physical Exam Physical Exam  Nursing note and vitals reviewed. Constitutional: He is oriented to person, place, and time. He appears well-developed and well-nourished. No distress.  HENT:  Head: Normocephalic and atraumatic.  Eyes: Pupils are equal, round, and reactive to light.  Neck: Normal range of motion.  Cardiovascular: Normal rate and intact distal pulses.   Pulmonary/Chest: No respiratory distress.  Abdominal: Normal appearance. He exhibits no distension.  Musculoskeletal: Normal range of motion.  Neurological: He is alert and oriented to person, place, and time. No cranial nerve deficit.  Skin: Skin is warm and dry. No rash noted.  Psychiatric: Patient is hallucinating and angry.  Patient is unsure about his suicidal ideations.   ED Treatments / Results  Labs (all labs ordered are listed, but only abnormal results are displayed) Labs Reviewed  COMPREHENSIVE METABOLIC PANEL - Abnormal; Notable for the following:       Result Value   Glucose, Bld 106 (*)    BUN 21 (*)    Calcium 8.8 (*)    All other components within normal limits  ACETAMINOPHEN LEVEL - Abnormal; Notable for the following:    Acetaminophen (Tylenol), Serum <10 (*)    All other components within normal  limits  ETHANOL  SALICYLATE LEVEL  CBC  URINE RAPID DRUG SCREEN, HOSP PERFORMED    EKG  EKG Interpretation None       Radiology No results found.  Procedures Procedures (including critical care time)  Medications Ordered in ED Medications  alum & mag hydroxide-simeth (MAALOX/MYLANTA) 200-200-20 MG/5ML suspension 30 mL (not administered)  ondansetron (ZOFRAN) tablet 4 mg (not administered)  zolpidem (AMBIEN) tablet 5 mg (not administered)  acetaminophen (TYLENOL) tablet 650 mg (not administered)  LORazepam (ATIVAN) tablet 1 mg (not administered)  OLANZapine zydis (ZYPREXA) disintegrating tablet 10 mg (10 mg  Oral Given 04/20/16 1359)     Initial Impression / Assessment and Plan / ED Course  I have reviewed the triage vital signs and the nursing notes.  Pertinent labs & imaging results that were available during my care of the patient were reviewed by me and considered in my medical decision making (see chart for details).  Clinical Course  Comment By Time  Agent medically cleared.  Will be referred to psychiatry for further treatment and evaluation. Nelva Nay, MD 07/23 1537      Final Clinical Impressions(s) / ED Diagnoses   Final diagnoses:  Hallucinations  Paranoia (HCC)    New Prescriptions New Prescriptions   No medications on file     Nelva Nay, MD 04/20/16 1537

## 2016-04-20 NOTE — ED Triage Notes (Signed)
Pt presents w/ [employer].  C/o auditory hallucinations and HI.  Sts the voices are telling him to "vandalize things and start fights."  Denies SI.  Pt reports that he has been taking his medications, but the "cell towers are interfering."

## 2016-04-20 NOTE — ED Notes (Signed)
Patient has been in his room masturbating, patient came out of his room with his penis exposed and needing much redirection.  Patient has made sexually inappropriate statements and gestures toward staff.

## 2016-04-20 NOTE — ED Notes (Signed)
Patient came in waving at everyone and saying "hello."  Patient was just here yesterday.  When asked why he was here, he stated, "I gotta get [facility], man.  I'm dried up; burned up."  Patient stating that he is having auditory hallucinations.  He is currently resting and watching TV.

## 2016-04-20 NOTE — BH Assessment (Signed)
Assessment Note  Ryan York is an 45 y.o. male.  Patient was brought into the ED by [employer] because of auditory hallucinations, agitation, verbal aggression.  Patient denies SI, HI, and other self-injurious behaviors.  Patient reports having auditory hallucinations of loud screams, voices telling him to be homosexual, and consistent.  "Ryan York is taking a tool on me and I just cant take it anymore".  Patient was discharged from [facility] on 7/21 to follow-up with [facility] outpatient services.  Patient reports he attempted to take his medications but believes he need inpatient treatment .  This Clinical research associate consulted with Drenda Freeze, NP refer for inpatient treatment.   Diagnosis: Schizophrenia  Past Medical History:  Past Medical History:  Diagnosis Date  . Asthma   . Bipolar disorder (HCC)   . Hypertension   . Schizophrenia [facility])     Past Surgical History:  Procedure Laterality Date  . KNEE SURGERY      Family History: History reviewed. No pertinent family history.  Social History:  reports that he has been smoking Cigarettes and Cigars.  He has a 5.00 pack-year smoking history. He has never used smokeless tobacco. He reports that he does not drink alcohol or use drugs.  Additional Social History:  Alcohol / Drug Use Pain Medications: see chart Prescriptions: see chart Over the Counter: see chart History of alcohol / drug use?:  (Pt denies)  CIWA: CIWA-Ar BP: 112/76 Pulse Rate: 77 COWS:    Allergies:  Allergies  Allergen Reactions  . Vistaril [Hydroxyzine] Nausea Only    Severe stomach pain    Home Medications:  (Not in a hospital admission)  OB/GYN Status:  No LMP for male patient.  General Assessment Data Location of Assessment: [facility] ED TTS Assessment: In system Is this a Tele or Face-to-Face Assessment?: Face-to-Face Is this an Initial Assessment or a Re-assessment for this encounter?: Initial Assessment Marital status: Single Is patient pregnant?: No Pregnancy  Status: No Living Arrangements: Other (Comment) (homelessness) Can pt return to current living arrangement?: Yes Admission Status: Voluntary Is patient capable of signing voluntary admission?: Yes Referral Source: Self/Family/Friend Insurance type: MCD  Medical Screening Exam [facility] Walk-in ONLY) Medical Exam completed: Yes  Crisis Care Plan Living Arrangements: Other (Comment) (homelessness) Name of Psychiatrist: [facility] Name of Therapist: [facility]  Education Status Is patient currently in school?: No Highest grade of school patient has completed: 12th grade Name of school: na Contact person: na  Risk to self with the past 6 months Suicidal Ideation: No-Not Currently/Within Last 6 Months Has patient been a risk to self within the past 6 months prior to admission? : No Suicidal Intent: No-Not Currently/Within Last 6 Months Has patient had any suicidal intent within the past 6 months prior to admission? : No Is patient at risk for suicide?: No Suicidal Plan?: No-Not Currently/Within Last 6 Months Has patient had any suicidal plan within the past 6 months prior to admission? : No Access to Means: No What has been your use of drugs/alcohol within the last 12 months?: none reported Previous Attempts/Gestures: No How many times?: 0 Intentional Self Injurious Behavior: None Family Suicide History: No Recent stressful life event(s): Conflict (Comment), Loss (Comment), Financial Problems, Turmoil (Comment) Persecutory voices/beliefs?: Yes Depression: Yes Depression Symptoms: Isolating, Guilt, Fatigue, Loss of interest in usual pleasures, Feeling angry/irritable Substance abuse history and/or treatment for substance abuse?: No  Risk to Others within the past 6 months Homicidal Ideation: No-Not Currently/Within Last 6 Months Does patient have any lifetime risk  of violence toward others beyond the six months prior to admission? : No Thoughts of Harm to Others: No Comment -  Thoughts of Harm to Others: No specific targets  but will defends self Current Homicidal Intent: No Current Homicidal Plan: No Access to Homicidal Means: No History of harm to others?: No Assessment of Violence: On admission Violent Behavior Description: agitation, verbal aggression Does patient have access to weapons?: No Criminal Charges Pending?: No Does patient have a court date: No Is patient on probation?: Unknown  Psychosis Hallucinations: Auditory, With command (screaming, feeling touching, trying to make him homosexual) Delusions: Persecutory  Mental Status Report Appearance/Hygiene: In scrubs Eye Contact: Fair Motor Activity: Freedom of movement Speech: Pressured Level of Consciousness: Alert Mood: Labile Affect: Irritable, Anxious Anxiety Level: Moderate Thought Processes: Circumstantial Judgement: Impaired Orientation: Person, Place, Situation Obsessive Compulsive Thoughts/Behaviors: None  Cognitive Functioning Concentration: Fair Memory: Remote Intact, Recent Intact IQ: Average Insight: Poor Impulse Control: Poor Appetite: Fair Weight Loss: 0 Weight Gain: 0 Sleep: No Change Total Hours of Sleep: 8 Vegetative Symptoms: None  ADLScreening [facility] Assessment Services) Patient's cognitive ability adequate to safely complete daily activities?: Yes Patient able to express need for assistance with ADLs?: Yes Independently performs ADLs?: Yes (appropriate for developmental age)  Prior Inpatient Therapy Prior Inpatient Therapy: Yes Prior Therapy Dates: 2010, 2012, 2013 Prior Therapy Facilty/Provider(s): [facility]  Reason for Treatment: Schizoaffective disorder   Prior Outpatient Therapy Prior Outpatient Therapy: Yes Prior Therapy Dates: Last 3 years Prior Therapy Facilty/Provider(s): [facility] Reason for Treatment: mental halth Does patient have an ACCT team?: Yes Does patient have Intensive In-House Services?  : No Does patient have P4CC services?:  No  ADL Screening (condition at time of admission) Patient's cognitive ability adequate to safely complete daily activities?: Yes Patient able to express need for assistance with ADLs?: Yes Independently performs ADLs?: Yes (appropriate for developmental age)       Abuse/Neglect Assessment (Assessment to be complete while patient is alone) Physical Abuse: Denies Verbal Abuse: Denies Sexual Abuse: Denies Exploitation of patient/patient's resources: Denies Self-Neglect: Denies Values / Beliefs Cultural Requests During Hospitalization: None Spiritual Requests During Hospitalization: None Consults Spiritual Care Consult Needed: No Social Work Consult Needed: No Merchant navy officer (For Healthcare) Does patient have an advance directive?: No Would patient like information on creating an advanced directive?: No - patient declined information    Additional Information 1:1 In Past 12 Months?: No CIRT Risk: No Elopement Risk: No Does patient have medical clearance?: Yes     Disposition:  Disposition Initial Assessment Completed for this Encounter: Yes Disposition of Patient: Inpatient treatment program Type of inpatient treatment program: Adult  On Site Evaluation by:   Reviewed with Physician:    Maryelizabeth Rowan A 04/20/2016 1:39 PM

## 2016-04-20 NOTE — ED Notes (Signed)
Pt has been changed into scrubs.  Pt and belongings wanded by Security.  

## 2016-04-21 DIAGNOSIS — F250 Schizoaffective disorder, bipolar type: Secondary | ICD-10-CM

## 2016-04-21 NOTE — ED Notes (Signed)
Pt appears agitated this morning.  He believes there is a tracking device on him.  He was pacing around room as he was speaking to me.  States he is continuing to have audio hallucinations but did not elaborate.  15 minute checks and video monitoring continue.

## 2016-04-21 NOTE — ED Notes (Signed)
During rounds when patient was told he would be discharged he became verbally aggressive.  Calling us "cheap"   He was very irritable and wanted [place] to keep him here.  All belongings were returned to patient and security and [employer] escorted pt. Out.

## 2016-04-21 NOTE — Consult Note (Signed)
[place] Psychiatry Consult   Reason for Consult:  Anger  Referring Physician:  EDP Patient Identification: Ryan York MRN:  [id number] Principal Diagnosis: Schizoaffective disorder, bipolar type Diagnosis:   Patient Active Problem List   Diagnosis Date Noted  . Tobacco use disorder [F17.200] 04/04/2016  . Schizoaffective disorder, bipolar type ([place]) [F25.0] 04/20/2012    Total Time spent with patient: 45 minutes  Subjective:   Ryan York is a 44 y.o. male patient does not warrant admission.  HPI:  45 yo male who was discharged the day before he returned last night, demanding and disrespectful.  [place] but informed him that was his prerogative   not ours.  Denies suicidal/homicidal ideations, hallucinations, and alcohol/drug abuse.  Stable for discharge, started ranting and raving while degrading everyone.  Anger issues but no real threat to anyone or self.  Past Psychiatric History: schizoaffective disorder, bipolar type  Risk to Self: Suicidal Ideation: No-Not Currently/Within Last 6 Months Suicidal Intent: No-Not Currently/Within Last 6 Months Is patient at risk for suicide?: No Suicidal Plan?: No-Not Currently/Within Last 6 Months Access to Means: No What has been your use of drugs/alcohol within the last 12 months?: none reported How many times?: 0 Intentional Self Injurious Behavior: None Risk to Others: Homicidal Ideation: No-Not Currently/Within Last 6 Months Thoughts of Harm to Others: No Comment - Thoughts of Harm to Others: No specific targets  but will defends self Current Homicidal Intent: No Current Homicidal Plan: No Access to Homicidal Means: No History of harm to others?: No Assessment of Violence: On admission Violent Behavior Description: agitation, verbal aggression Does patient have access to weapons?: No Criminal Charges Pending?: No Does patient have a court date: No Prior Inpatient Therapy:  Prior Inpatient Therapy: Yes Prior Therapy Dates: 2010, 2012, 2013 Prior Therapy Facilty/Provider(s): [facility]  Reason for Treatment: Schizoaffective disorder  Prior Outpatient Therapy: Prior Outpatient Therapy: Yes Prior Therapy Dates: Last 3 years Prior Therapy Facilty/Provider(s): [facility] Reason for Treatment: mental halth Does patient have an ACCT team?: Yes Does patient have Intensive In-House Services?  : No Does patient have P4CC services?: No  Past Medical History:  Past Medical History:  Diagnosis Date  . Asthma   . Bipolar disorder ([place])   . Hypertension   . Schizophrenia [facility])     Past Surgical History:  Procedure Laterality Date  . KNEE SURGERY     Family History: History reviewed. No pertinent family history. Family Psychiatric  History: none Social History:  History  Alcohol Use No    Comment: Pt stated that he rarely consumes alcohol     History  Drug Use No    Comment: Pt denies     Social History   Social History  . Marital status: Single    Spouse name: N/A  . Number of children: N/A  . Years of education: N/A   Social History Main Topics  . Smoking status: Current Some Day Smoker    Packs/day: 0.50    Years: 10.00    Types: Cigarettes, Cigars  . Smokeless tobacco: Never Used  . Alcohol use No     Comment: Pt stated that he rarely consumes alcohol  . Drug use: No     Comment: Pt denies   . Sexual activity: Yes    Birth control/ protection: None   Other Topics Concern  . None   Social History Narrative  . None   Additional Social History:  Allergies:   Allergies  Allergen Reactions  . Vistaril [Hydroxyzine] Nausea Only    Severe stomach pain    Labs:  Results for orders placed or performed during the hospital encounter of 04/20/16 (from the past 48 hour(s))  Rapid urine drug screen (hospital performed)     Status: None   Collection Time: 04/20/16 10:55 AM  Result Value Ref Range   Opiates NONE DETECTED NONE DETECTED    Cocaine NONE DETECTED NONE DETECTED   Benzodiazepines NONE DETECTED NONE DETECTED   Amphetamines NONE DETECTED NONE DETECTED   Tetrahydrocannabinol NONE DETECTED NONE DETECTED   Barbiturates NONE DETECTED NONE DETECTED    Comment:        DRUG SCREEN FOR MEDICAL PURPOSES ONLY.  IF CONFIRMATION IS NEEDED FOR ANY PURPOSE, NOTIFY LAB WITHIN 5 DAYS.        LOWEST DETECTABLE LIMITS FOR URINE DRUG SCREEN Drug Class       Cutoff (ng/mL) Amphetamine      1000 Barbiturate      200 Benzodiazepine   338 Tricyclics       329 Opiates          300 Cocaine          300 THC              50   Comprehensive metabolic panel     Status: Abnormal   Collection Time: 04/20/16 11:45 AM  Result Value Ref Range   Sodium 138 135 - 145 mmol/L   Potassium 3.8 3.5 - 5.1 mmol/L   Chloride 105 101 - 111 mmol/L   CO2 27 22 - 32 mmol/L   Glucose, Bld 106 (H) 65 - 99 mg/dL   BUN 21 (H) 6 - 20 mg/dL   Creatinine, Ser 1.23 0.61 - 1.24 mg/dL   Calcium 8.8 (L) 8.9 - 10.3 mg/dL   Total Protein 7.6 6.5 - 8.1 g/dL   Albumin 4.5 3.5 - 5.0 g/dL   AST 21 15 - 41 U/L   ALT 26 17 - 63 U/L   Alkaline Phosphatase 80 38 - 126 U/L   Total Bilirubin 0.6 0.3 - 1.2 mg/dL   GFR calc non Af Amer >60 >60 mL/min   GFR calc Af Amer >60 >60 mL/min    Comment: (NOTE) The eGFR has been calculated using the CKD EPI equation. This calculation has not been validated in all clinical situations. eGFR's persistently <60 mL/min signify possible Chronic Kidney Disease.    Anion gap 6 5 - 15  cbc     Status: None   Collection Time: 04/20/16 11:45 AM  Result Value Ref Range   WBC 7.6 4.0 - 10.5 K/uL   RBC 4.29 4.22 - 5.81 MIL/uL   Hemoglobin 14.4 13.0 - 17.0 g/dL   HCT 40.9 39.0 - 52.0 %   MCV 95.3 78.0 - 100.0 fL   MCH 33.6 26.0 - 34.0 pg   MCHC 35.2 30.0 - 36.0 g/dL   RDW 12.8 11.5 - 15.5 %   Platelets 156 150 - 400 K/uL  Ethanol     Status: None   Collection Time: 04/20/16 11:47 AM  Result Value Ref Range   Alcohol,  Ethyl (B) <5 <5 mg/dL    Comment:        LOWEST DETECTABLE LIMIT FOR SERUM ALCOHOL IS 5 mg/dL FOR MEDICAL PURPOSES ONLY   Salicylate level     Status: None   Collection Time: 04/20/16 11:47 AM  Result Value Ref Range  Salicylate Lvl <8.1 2.8 - 30.0 mg/dL  Acetaminophen level     Status: Abnormal   Collection Time: 04/20/16 11:47 AM  Result Value Ref Range   Acetaminophen (Tylenol), Serum <10 (L) 10 - 30 ug/mL    Comment:        THERAPEUTIC CONCENTRATIONS VARY SIGNIFICANTLY. A RANGE OF 10-30 ug/mL MAY BE AN EFFECTIVE CONCENTRATION FOR MANY PATIENTS. HOWEVER, SOME ARE BEST TREATED AT CONCENTRATIONS OUTSIDE THIS RANGE. ACETAMINOPHEN CONCENTRATIONS >150 ug/mL AT 4 HOURS AFTER INGESTION AND >50 ug/mL AT 12 HOURS AFTER INGESTION ARE OFTEN ASSOCIATED WITH TOXIC REACTIONS.     Current Facility-Administered Medications  Medication Dose Route Frequency Provider Last Rate Last Dose  . acetaminophen (TYLENOL) tablet 650 mg  650 mg Oral Q4H PRN Leonard Schwartz, MD   650 mg at 04/21/16 0440  . alum & mag hydroxide-simeth (MAALOX/MYLANTA) 200-200-20 MG/5ML suspension 30 mL  30 mL Oral PRN Leonard Schwartz, MD      . OLANZapine zydis (ZYPREXA) disintegrating tablet 10 mg  10 mg Oral Q8H PRN Lurena Nida, NP   10 mg at 04/20/16 1359  . ondansetron (ZOFRAN) tablet 4 mg  4 mg Oral Q8H PRN Leonard Schwartz, MD      . zolpidem [facility]) tablet 5 mg  5 mg Oral QHS PRN Leonard Schwartz, MD       Current Outpatient Prescriptions  Medication Sig Dispense Refill  . montelukast (SINGULAIR) 10 MG tablet Take 10 mg by mouth daily as needed. For allergies.  0  . QUEtiapine (SEROQUEL XR) 400 MG 24 hr tablet Take 2 tablets (800 mg total) by mouth at bedtime. 60 tablet 1  . traZODone (DESYREL) 100 MG tablet Take 1 tablet (100 mg total) by mouth at bedtime as needed for sleep. 30 tablet 1    Musculoskeletal: Strength & Muscle Tone: within normal limits Gait & Station: normal Patient leans: N/A  Psychiatric  Specialty Exam: Physical Exam  Constitutional: He is oriented to person, place, and time. He appears well-developed and well-nourished.  HENT:  Head: Normocephalic.  Neck: Normal range of motion.  Respiratory: Effort normal.  Musculoskeletal: Normal range of motion.  Neurological: He is alert and oriented to person, place, and time.  Skin: Skin is warm and dry.  Psychiatric: His speech is normal and behavior is normal. Judgment and thought content normal. His affect is angry. Cognition and memory are normal.    Review of Systems  Constitutional: Negative.   HENT: Negative.   Eyes: Negative.   Respiratory: Negative.   Cardiovascular: Negative.   Gastrointestinal: Negative.   Genitourinary: Negative.   Musculoskeletal: Negative.   Skin: Negative.   Neurological: Negative.   Endo/Heme/Allergies: Negative.   Psychiatric/Behavioral: Negative.     Blood pressure 108/62, pulse 81, temperature 97.9 F (36.6 C), temperature source Oral, resp. rate 20, SpO2 97 %.There is no height or weight on file to calculate BMI.  General Appearance: Casual  Eye Contact:  Good  Speech:  Normal Rate  Volume:  Normal  Mood:  Angry  Affect:  Congruent  Thought Process:  Coherent and Descriptions of Associations: Intact  Orientation:  Full (Time, Place, and Person)  Thought Content:  WDL  Suicidal Thoughts:  No  Homicidal Thoughts:  No  Memory:  Immediate;   Good Recent;   Good Remote;   Good  Judgement:  Fair  Insight:  Fair  Psychomotor Activity:  Increased  Concentration:  Concentration: Good and Attention Span: Good  Recall:  Good  Fund of Knowledge:  Fair  Language:  Good  Akathisia:  No  Handed:  Right  AIMS (if indicated):     Assets:  Leisure Time Physical Health Resilience  ADL's:  Intact  Cognition:  WNL  Sleep:        Treatment Plan Summary: Daily contact with patient to assess and evaluate symptoms and progress in treatment, Medication management and Plan schizoaffective  disorder, bipolar type:  -Crisis stabilization -Medication management:  PRN medications started along with Zyprexa 10 mg every 8 hours PRN agitation. -Individual counseling  Disposition: No evidence of imminent risk to self or others at present.    Waylan Boga, NP 04/21/2016 9:28 AM  Patient seen face-to-face for psychiatric evaluation, chart reviewed and case discussed with the physician extender and developed treatment plan. Reviewed the information documented and agree with the treatment plan. Corena Pilgrim, MD

## 2016-04-21 NOTE — ED Notes (Signed)
Patient noted in room. No complaints, stable, in no acute distress. Q15 minute rounds and monitoring via Security Cameras to continue.  

## 2016-04-21 NOTE — ED Notes (Signed)
Patient noted sleeping in room. No complaints, stable, in no acute distress. Q15 minute rounds and monitoring via Security Cameras to continue.  

## 2016-04-21 NOTE — [facility] Suicide Risk Assessment (Signed)
Suicide Risk Assessment  Discharge Assessment   [facility] Discharge Suicide Risk Assessment   Principal Problem: <principal problem not specified> Discharge Diagnoses:  Patient Active Problem List   Diagnosis Date Noted  . Tobacco use disorder [F17.200] 04/04/2016  . Schizoaffective disorder, bipolar type (HCC) [F25.0] 04/20/2012    Total Time spent with patient: 45 minutes   Musculoskeletal: Strength & Muscle Tone: within normal limits Gait & Station: normal Patient leans: N/A  Psychiatric Specialty Exam: Physical Exam  Constitutional: He is oriented to person, place, and time. He appears well-developed and well-nourished.  HENT:  Head: Normocephalic.  Neck: Normal range of motion.  Respiratory: Effort normal.  Musculoskeletal: Normal range of motion.  Neurological: He is alert and oriented to person, place, and time.  Skin: Skin is warm and dry.  Psychiatric: His speech is normal and behavior is normal. Judgment and thought content normal. His affect is angry. Cognition and memory are normal.    Review of Systems  Constitutional: Negative.   HENT: Negative.   Eyes: Negative.   Respiratory: Negative.   Cardiovascular: Negative.   Gastrointestinal: Negative.   Genitourinary: Negative.   Musculoskeletal: Negative.   Skin: Negative.   Neurological: Negative.   Endo/Heme/Allergies: Negative.   Psychiatric/Behavioral: Negative.     Blood pressure 108/62, pulse 81, temperature 97.9 F (36.6 C), temperature source Oral, resp. rate 20, SpO2 97 %.There is no height or weight on file to calculate BMI.  General Appearance: Casual  Eye Contact:  Good  Speech:  Normal Rate  Volume:  Normal  Mood:  Angry  Affect:  Congruent  Thought Process:  Coherent and Descriptions of Associations: Intact  Orientation:  Full (Time, Place, and Person)  Thought Content:  WDL  Suicidal Thoughts:  No  Homicidal Thoughts:  No  Memory:  Immediate;   Good Recent;   Good Remote;   Good   Judgement:  Fair  Insight:  Fair  Psychomotor Activity:  Increased  Concentration:  Concentration: Good and Attention Span: Good  Recall:  Good  Fund of Knowledge:  Fair  Language:  Good  Akathisia:  No  Handed:  Right  AIMS (if indicated):     Assets:  Leisure Time Physical Health Resilience  ADL's:  Intact  Cognition:  WNL  Sleep:      Mental Status Per Nursing Assessment::   On Admission:   anger issues  Demographic Factors:  Male  Loss Factors: NA  Historical Factors: NA  Risk Reduction Factors:   Sense of responsibility to family, Living with another person, especially a relative, Positive social support and Positive therapeutic relationship  Continued Clinical Symptoms:  Anger  Cognitive Features That Contribute To Risk:  None    Suicide Risk:  Minimal: No identifiable suicidal ideation.  Patients presenting with no risk factors but with morbid ruminations; may be classified as minimal risk based on the severity of the depressive symptoms    Plan Of Care/Follow-up recommendations:  Activity:  as tolerated Diet:  heart healthy diet  Natalin Bible, NP 04/21/2016, 10:51 AM

## 2016-04-22 ENCOUNTER — Emergency Department (HOSPITAL_COMMUNITY)
Admission: EM | Admit: 2016-04-22 | Discharge: 2016-04-22 | Disposition: A | Discharge status: Home / Self Care | Payer: Medicaid Other | Attending: Emergency Medicine | Admitting: Emergency Medicine

## 2016-04-22 ENCOUNTER — Encounter (HOSPITAL_COMMUNITY): Payer: Self-pay | Admitting: Emergency Medicine

## 2016-04-22 ENCOUNTER — Encounter (HOSPITAL_COMMUNITY): Payer: Self-pay

## 2016-04-22 ENCOUNTER — Emergency Department (HOSPITAL_COMMUNITY)
Admission: EM | Admit: 2016-04-22 | Discharge: 2016-04-23 | Disposition: A | Discharge status: Home / Self Care | Payer: Medicaid Other | Source: Home / Self Care | Attending: Emergency Medicine | Admitting: Emergency Medicine

## 2016-04-22 DIAGNOSIS — F319 Bipolar disorder, unspecified: Secondary | ICD-10-CM | POA: Insufficient documentation

## 2016-04-22 DIAGNOSIS — Z79899 Other long term (current) drug therapy: Secondary | ICD-10-CM

## 2016-04-22 DIAGNOSIS — J45909 Unspecified asthma, uncomplicated: Secondary | ICD-10-CM

## 2016-04-22 DIAGNOSIS — R440 Auditory hallucinations: Secondary | ICD-10-CM | POA: Insufficient documentation

## 2016-04-22 DIAGNOSIS — E860 Dehydration: Secondary | ICD-10-CM | POA: Diagnosis not present

## 2016-04-22 DIAGNOSIS — F919 Conduct disorder, unspecified: Secondary | ICD-10-CM | POA: Diagnosis not present

## 2016-04-22 DIAGNOSIS — R443 Hallucinations, unspecified: Secondary | ICD-10-CM | POA: Diagnosis present

## 2016-04-22 DIAGNOSIS — F17210 Nicotine dependence, cigarettes, uncomplicated: Secondary | ICD-10-CM | POA: Insufficient documentation

## 2016-04-22 DIAGNOSIS — I10 Essential (primary) hypertension: Secondary | ICD-10-CM

## 2016-04-22 DIAGNOSIS — F250 Schizoaffective disorder, bipolar type: Secondary | ICD-10-CM | POA: Diagnosis not present

## 2016-04-22 DIAGNOSIS — R441 Visual hallucinations: Secondary | ICD-10-CM | POA: Diagnosis not present

## 2016-04-22 LAB — COMPREHENSIVE METABOLIC PANEL
ALT: 28 U/L (ref 17–63)
ANION GAP: 6 (ref 5–15)
AST: 27 U/L (ref 15–41)
Albumin: 4.5 g/dL (ref 3.5–5.0)
Alkaline Phosphatase: 75 U/L (ref 38–126)
BUN: 22 mg/dL — ABNORMAL HIGH (ref 6–20)
CHLORIDE: 105 mmol/L (ref 101–111)
CO2: 27 mmol/L (ref 22–32)
Calcium: 9.2 mg/dL (ref 8.9–10.3)
Creatinine, Ser: 1.17 mg/dL (ref 0.61–1.24)
GFR calc non Af Amer: >60 mL/min (ref >60)
Glucose, Bld: 89 mg/dL (ref 65–99)
POTASSIUM: 4.2 mmol/L (ref 3.5–5.1)
SODIUM: 138 mmol/L (ref 135–145)
Total Bilirubin: 0.5 mg/dL (ref 0.3–1.2)
Total Protein: 8.1 g/dL (ref 6.5–8.1)

## 2016-04-22 LAB — CBC
HCT: 43.6 % (ref 39.0–52.0)
HEMOGLOBIN: 15.2 g/dL (ref 13.0–17.0)
MCH: 33.4 pg (ref 26.0–34.0)
MCHC: 34.9 g/dL (ref 30.0–36.0)
MCV: 95.8 fL (ref 78.0–100.0)
PLATELETS: 156 10*3/uL (ref 150–400)
RBC: 4.55 MIL/uL (ref 4.22–5.81)
RDW: 13.2 % (ref 11.5–15.5)
WBC: 9.2 10*3/uL (ref 4.0–10.5)

## 2016-04-22 LAB — RAPID URINE DRUG SCREEN, HOSP PERFORMED
AMPHETAMINES: NOT DETECTED
Barbiturates: NOT DETECTED
Benzodiazepines: NOT DETECTED
COCAINE: NOT DETECTED
OPIATES: NOT DETECTED
TETRAHYDROCANNABINOL: NOT DETECTED

## 2016-04-22 LAB — ACETAMINOPHEN LEVEL

## 2016-04-22 LAB — SALICYLATE LEVEL: SALICYLATE LVL: 6.6 mg/dL (ref 2.8–30.0)

## 2016-04-22 LAB — ETHANOL

## 2016-04-22 NOTE — ED Triage Notes (Signed)
Pt states he is homeless, auditory hallucinations, paparazzi finding him for his 37 championships.  Mentioned multiple medical history and how House does not help him

## 2016-04-22 NOTE — ED Provider Notes (Signed)
[facility]-EMERGENCY DEPT Provider Note   CSN: [id number] Arrival date & time: 04/22/16  6579  First Provider Contact:   04/22/2016 9 AM  Level V caveat psychiatric disorder  History   Chief Complaint Chief Complaint  Patient presents with  . Dehydration  . Hallucinations    HPI Ryan York is a 45 y.o. male.Patient complains of hallucinations, hearing voices telling him that he needs to be homosexual , "Btut I'm not homosexual". He denies other complaint. He denies want to harm himself or anyone else. He states "I know I'm schizophrenic." He also felt dehydrated upon arrival here last night however no longer feels dehydrated since having a meal here. No other associated symptoms. He is asymptomatic presently except for hearing voices. He states the voices don't really bother him. Nothing makes symptoms better or worse.   HPI  Past Medical History:  Diagnosis Date  . Asthma   . Bipolar disorder (HCC)   . Hypertension   . Schizophrenia [facility])     Patient Active Problem List   Diagnosis Date Noted  . Tobacco use disorder 04/04/2016  . Schizoaffective disorder, bipolar type (HCC) 04/20/2012    Past Surgical History:  Procedure Laterality Date  . KNEE SURGERY         Home Medications    Prior to Admission medications   Medication Sig Start Date End Date Taking? Authorizing Provider  montelukast (SINGULAIR) 10 MG tablet Take 10 mg by mouth daily as needed. For allergies. 01/07/16  Yes Historical Provider, MD  QUEtiapine (SEROQUEL XR) 400 MG 24 hr tablet Take 2 tablets (800 mg total) by mouth at bedtime. 04/10/16  Yes [name] B Pucilowska, MD  traZODone (DESYREL) 100 MG tablet Take 1 tablet (100 mg total) by mouth at bedtime as needed for sleep. 04/10/16  Yes Shari Prows, MD    Family History History reviewed. No pertinent family history.  Social History Social History  Substance Use Topics  . Smoking status: Current Some Day Smoker    Packs/day: 0.50   Years: 10.00    Types: Cigarettes, Cigars  . Smokeless tobacco: Never Used  . Alcohol use No     Comment: Pt stated that he rarely consumes alcohol     Allergies   Vistaril [hydroxyzine]   Review of Systems Review of Systems  Unable to perform ROS: Psychiatric disorder  Constitutional:       Felt dehydrated  Psychiatric/Behavioral: Positive for hallucinations.     Physical Exam Updated Vital Signs BP 127/98 (BP Location: Left Arm)   Pulse 72   Temp 98.4 F (36.9 C) (Oral)   Resp 18   Ht 6\' 1"  (1.854 m)   Wt 215 lb (97.5 kg)   SpO2 100%   BMI 28.37 kg/m   Physical Exam  Constitutional: He is oriented to person, place, and time. He appears well-developed and well-nourished. No distress.  HENT:  Head: Normocephalic and atraumatic.  Eyes: Conjunctivae are normal. Pupils are equal, round, and reactive to light.  Neck: Neck supple. No tracheal deviation present. No thyromegaly present.  Cardiovascular: Normal rate and regular rhythm.   No murmur heard. Pulmonary/Chest: Effort normal and breath sounds normal.  Abdominal: Soft. Bowel sounds are normal. He exhibits no distension. There is no tenderness.  Musculoskeletal: Normal range of motion. He exhibits no edema or tenderness.  Neurological: He is alert and oriented to person, place, and time. Coordination normal.  Gait normal  Skin: Skin is warm and dry. No rash noted.  Psychiatric:  He has a normal mood and affect.  Pleasant cooperative.  Nursing note and vitals reviewed.    ED Treatments / Results  Labs (all labs ordered are listed, but only abnormal results are displayed) Labs Reviewed  COMPREHENSIVE METABOLIC PANEL - Abnormal; Notable for the following:       Result Value   BUN 22 (*)    All other components within normal limits  ACETAMINOPHEN LEVEL - Abnormal; Notable for the following:    Acetaminophen (Tylenol), Serum <10 (*)    All other components within normal limits  ETHANOL  SALICYLATE LEVEL    CBC  URINE RAPID DRUG SCREEN, HOSP PERFORMED    EKG  EKG Interpretation None       Radiology No results found.  Procedures Procedures (including critical care time)  Medications Ordered in ED Medications - No data to display  Results for orders placed or performed during the hospital encounter of 04/22/16  Comprehensive metabolic panel  Result Value Ref Range   Sodium 138 135 - 145 mmol/L   Potassium 4.2 3.5 - 5.1 mmol/L   Chloride 105 101 - 111 mmol/L   CO2 27 22 - 32 mmol/L   Glucose, Bld 89 65 - 99 mg/dL   BUN 22 (H) 6 - 20 mg/dL   Creatinine, Ser 1.61 0.61 - 1.24 mg/dL   Calcium 9.2 8.9 - 09.6 mg/dL   Total Protein 8.1 6.5 - 8.1 g/dL   Albumin 4.5 3.5 - 5.0 g/dL   AST 27 15 - 41 U/L   ALT 28 17 - 63 U/L   Alkaline Phosphatase 75 38 - 126 U/L   Total Bilirubin 0.5 0.3 - 1.2 mg/dL   GFR calc non Af Amer >60 >60 mL/min   GFR calc Af Amer >60 >60 mL/min   Anion gap 6 5 - 15  Ethanol  Result Value Ref Range   Alcohol, Ethyl (B) <5 <5 mg/dL  Salicylate level  Result Value Ref Range   Salicylate Lvl 6.6 2.8 - 30.0 mg/dL  Acetaminophen level  Result Value Ref Range   Acetaminophen (Tylenol), Serum <10 (L) 10 - 30 ug/mL  cbc  Result Value Ref Range   WBC 9.2 4.0 - 10.5 K/uL   RBC 4.55 4.22 - 5.81 MIL/uL   Hemoglobin 15.2 13.0 - 17.0 g/dL   HCT 04.5 40.9 - 81.1 %   MCV 95.8 78.0 - 100.0 fL   MCH 33.4 26.0 - 34.0 pg   MCHC 34.9 30.0 - 36.0 g/dL   RDW 91.4 78.2 - 95.6 %   Platelets 156 150 - 400 K/uL  Rapid urine drug screen (hospital performed)  Result Value Ref Range   Opiates NONE DETECTED NONE DETECTED   Cocaine NONE DETECTED NONE DETECTED   Benzodiazepines NONE DETECTED NONE DETECTED   Amphetamines NONE DETECTED NONE DETECTED   Tetrahydrocannabinol NONE DETECTED NONE DETECTED   Barbiturates NONE DETECTED NONE DETECTED   No results found.  Initial Impression / Assessment and Plan / ED Course  I have reviewed the triage vital signs and the nursing  notes.  Pertinent labs & imaging results that were available during my care of the patient were reviewed by me and considered in my medical decision making (see chart for details). Case discussed briefly with Dr.Akintayo who knows patient well and saw patient yesterday. He is not deemed to be a harm to himself or others. He feels much improved since eating a meal here. Patient wishes discharge from the ED. He is instructed  to follow-up with [facility] , an agency which deals with some his psychiatric issues Clinical Course    Patient does not appear to be dehydrated and looks well  Final Clinical Impressions(s) / ED Diagnoses  Auditory hallucinations  Final diagnoses:  None    New Prescriptions New Prescriptions   No medications on file     Doug Sou, MD 04/22/16 [phone]

## 2016-04-22 NOTE — ED Triage Notes (Signed)
Per EMS, pt from streets.  Pt here this morning and d/c.  Pt having hallucinations and wants to go to [facility]. Vitals:  144/88, hr 82, resp 18, 98% ra

## 2016-04-22 NOTE — ED Triage Notes (Signed)
Pt called [facility] tonight for feeling dehydrated and hearing AV hallucinations. Denies SI/HI. Alert. No orthostatic changes with EMS. [employer] [facility] ideas.

## 2016-04-22 NOTE — Discharge Instructions (Signed)
If you have ant thought of harming yourself or anyone else or feel that you may be in danger call 911 immediately.  Follow up with [facility] regarding your hallucinations

## 2016-04-22 NOTE — ED Notes (Signed)
Pt has in belonging bag:  Black book bag, black sandals, yellow towel, grey long sleeves shirt, grey shorts, black t-shirt, black socks

## 2016-04-23 ENCOUNTER — Encounter (HOSPITAL_COMMUNITY): Payer: Self-pay | Admitting: Licensed Clinical Social Worker

## 2016-04-23 NOTE — ED Notes (Addendum)
Pt up to the bathroom, in no acute distress, continues to wait on TTS

## 2016-04-23 NOTE — ED Notes (Signed)
TTS is backup and will be around in the morning for evaluation

## 2016-04-23 NOTE — ED Provider Notes (Signed)
Patient evaluated by TTS. Patient without any complaints, SI, HI. Plan to DC home with outpatient follow-up.  Pt in agreement with plan.     Tilden Fossa, MD 04/23/16 [phone]

## 2016-04-23 NOTE — BH Assessment (Signed)
Assessment Note  Ryan York is an 45 y.o. male with history of Schizophrenia. Patient brought to [facility] voluntarily due to auditory hallucinations. Patient denies SI and HI. No self injurious behaviors. He does report some depression such as anger, irritability, and loss of interest in usual pleasures. Patient stating that he recently gave up his section 8 apartment and this is now his biggest stressor. Patient unable to provide a reason for giving up his apartment stating, "It's not a big deal because I can get another one anytime now".  He denies HI. Denies history of harm to others. Patient has auditory hallucinations of male and male voices telling him that he should be homosexual. Sts that some of the voices on the other hand are telling him to "recheck in with females". Patient stating that he has heard voices most of his life. He denies current visual hallucinations. However, admits to a history of visual hallucination. Patient hospitalized multiple times in the past. He seeks outpatient services with [facility].      Diagnosis: Schizophrenia  Past Medical History:  Past Medical History:  Diagnosis Date  . Asthma   . Bipolar disorder (HCC)   . Hypertension   . Schizophrenia [facility])     Past Surgical History:  Procedure Laterality Date  . KNEE SURGERY      Family History: History reviewed. No pertinent family history.  Social History:  reports that he has been smoking Cigarettes and Cigars.  He has a 5.00 pack-year smoking history. He has never used smokeless tobacco. He reports that he does not drink alcohol or use drugs.  Additional Social History:  Alcohol / Drug Use Pain Medications: see chart Prescriptions: see chart Over the Counter: see chart History of alcohol / drug use?: No history of alcohol / drug abuse  CIWA: CIWA-Ar BP: 178/70 Pulse Rate: 99 COWS:    Allergies:  Allergies  Allergen Reactions  . Vistaril [Hydroxyzine] Nausea Only    Severe stomach  pain    Home Medications:  (Not in a hospital admission)  OB/GYN Status:  No LMP for male patient.  General Assessment Data Location of Assessment: [facility] ED TTS Assessment: In system Is this a Tele or Face-to-Face Assessment?: Face-to-Face Is this an Initial Assessment or a Re-assessment for this encounter?: Initial Assessment Marital status: Single Maiden name:  (n/a) Is patient pregnant?: No Pregnancy Status: No Living Arrangements: Other (Comment) (homelessness; recently gave up section 8 housing) Can pt return to current living arrangement?: Yes Admission Status: Voluntary Is patient capable of signing voluntary admission?: Yes Referral Source: Self/Family/Friend Insurance type:  (MCD)  Medical Screening Exam [facility] Walk-in ONLY) Medical Exam completed: Yes  Crisis Care Plan Living Arrangements: Other (Comment) (homelessness; recently gave up section 8 housing) Legal Guardian:  (no legal guardian ) Name of Psychiatrist: Psychologist, counselling) Name of Therapist: [facility]  Education Status Is patient currently in school?: No Current Grade:  (n/a) Highest grade of school patient has completed: 12th grade Name of school: na Contact person: na  Risk to self with the past 6 months Suicidal Ideation: No Has patient been a risk to self within the past 6 months prior to admission? : No Suicidal Intent: No Has patient had any suicidal intent within the past 6 months prior to admission? : No Is patient at risk for suicide?:  (none reported) Suicidal Plan?: No Has patient had any suicidal plan within the past 6 months prior to admission? : No Access to Means: No  What has been your use of drugs/alcohol within the last 12 months?:   (patient reports aclohol and drug use ) Previous Attempts/Gestures: No How many times?:  (0) Other Self Harm Risks:  (none reported) Triggers for Past Attempts: None known Intentional Self Injurious Behavior: None Family Suicide History:  No Recent stressful life event(s): Conflict (Comment), Loss (Comment), Job Loss, Financial Problems Persecutory voices/beliefs?: Yes Depression: Yes Depression Symptoms: Isolating Substance abuse history and/or treatment for substance abuse?: No Suicide prevention information given to non-admitted patients: Not applicable  Risk to Others within the past 6 months Homicidal Ideation: No Does patient have any lifetime risk of violence toward others beyond the six months prior to admission? : No Thoughts of Harm to Others: No-Not Currently Present/Within Last 6 Months Comment - Thoughts of Harm to Others:  (No specific targets but will defend self ) Current Homicidal Intent: No Current Homicidal Plan: No Access to Homicidal Means: No Identified Victim:  (n/a) History of harm to others?: No Assessment of Violence: On admission Violent Behavior Description:  (Agitation ) Does patient have access to weapons?: No Criminal Charges Pending?: No Does patient have a court date: No Is patient on probation?: Unknown  Psychosis Hallucinations: Auditory Delusions: Persecutory  Mental Status Report Appearance/Hygiene: In scrubs Eye Contact: Fair Motor Activity: Restlessness Speech: Pressured Level of Consciousness: Alert Mood: Labile Affect: Irritable, Anxious Anxiety Level: Minimal Thought Processes: Circumstantial Judgement: Impaired Orientation: Person, Place, Situation Obsessive Compulsive Thoughts/Behaviors: None  Cognitive Functioning Concentration: Fair Memory: Recent Intact, Remote Intact IQ: Average Insight: Poor Impulse Control: Fair Appetite:  (0) Weight Loss:  (0) Weight Gain:  (0) Sleep: No Change Total Hours of Sleep:  (8 hrs of sleep ) Vegetative Symptoms: None  ADLScreening [facility] Assessment Services) Patient's cognitive ability adequate to safely complete daily activities?: Yes Patient able to express need for assistance with ADLs?: Yes Independently performs  ADLs?: Yes (appropriate for developmental age)  Prior Inpatient Therapy Prior Inpatient Therapy: Yes Prior Therapy Dates: 2010, 2012, 2013 Prior Therapy Facilty/Provider(s): [facility]  Reason for Treatment: Schizoaffective disorder   Prior Outpatient Therapy Prior Outpatient Therapy: Yes Prior Therapy Dates: Last 3 years Prior Therapy Facilty/Provider(s): [facility] Reason for Treatment: mental halth Does patient have an ACCT team?: Yes Does patient have Intensive In-House Services?  : No Does patient have [facility] services? : No Does patient have P4CC services?: No  ADL Screening (condition at time of admission) Patient's cognitive ability adequate to safely complete daily activities?: Yes Is the patient deaf or have difficulty hearing?: No Does the patient have difficulty seeing, even when wearing glasses/contacts?: No Does the patient have difficulty concentrating, remembering, or making decisions?: No Patient able to express need for assistance with ADLs?: Yes Does the patient have difficulty dressing or bathing?: No Independently performs ADLs?: Yes (appropriate for developmental age) Communication: Independent Dressing (OT): Independent Grooming: Independent Feeding: Independent Bathing: Independent Toileting: Independent In/Out Bed: Independent Walks in Home: Independent Does the patient have difficulty walking or climbing stairs?: No Weakness of Legs: None Weakness of Arms/Hands: None  Home Assistive Devices/Equipment Home Assistive Devices/Equipment: None    Abuse/Neglect Assessment (Assessment to be complete while patient is alone) Physical Abuse: Denies Verbal Abuse: Denies Sexual Abuse: Denies Exploitation of patient/patient's resources: Denies Self-Neglect: Denies Values / Beliefs Cultural Requests During Hospitalization: None Spiritual Requests During Hospitalization: None Consults Spiritual Care Consult Needed: No Social Work Consult Needed:  No Merchant navy officer (For Healthcare) Does patient have an advance directive?: No Would patient like information on  creating an advanced directive?: No - patient declined information Nutrition Screen- [facility] Adult/[facility] Patient's home diet: Regular  Additional Information 1:1 In Past 12 Months?: No CIRT Risk: No Elopement Risk: No Does patient have medical clearance?: Yes     Disposition:  Disposition Initial Assessment Completed for this Encounter: Yes Disposition of Patient: Other dispositions, Outpatient treatment (Discharge patient per Nanine Means, DNP and Dr. Jannifer Franklin) Type of outpatient treatment: Adult ([facility] [phone]))  On Site Evaluation by:   Reviewed with Physician:  Nanine Means, DNP and Dr. Julien Nordmann, [facility] 04/23/2016 10:24 AM

## 2016-04-23 NOTE — Discharge Instructions (Signed)
Follow up with [facility], [place], [phone]

## 2016-04-23 NOTE — ED Provider Notes (Signed)
[facility]-EMERGENCY DEPT Provider Note   CSN: [id number] Arrival date & time: 04/22/16  7948  First Provider Contact:  First MD Initiated Contact with Patient 04/22/16 2027        History   Chief Complaint Chief Complaint  Patient presents with  . Hallucinations    HPI Ryan York is a 44 y.o. male.  45 year old male with history of asthma, bipolar disorder, gets a friend hernia presents for hallucinations. The patient was seen for the same earlier in the day. The patient has a history of schizophrenia and says that he has always heard voices. He says sometimes he can control them better than at other times. He said recently they've really been getting to him because the voices no about his asthma and no had a bother him. He states that one of his voices is trying to turn him into a homosexual. He also states that that same voice wants him to be rechecked in females. He said he has an issue with the [employer] already is very difficult to control them. He states that he came back today because right now he is homeless and that the voices have caused him to lose his place of residence. He denies any suicidal or homicidal ideation. The voices are not telling him to harm himself or anyone else. He said that he was hoping that, would allow him to come to the lobby of the ERs every day to clear his mind for about an hour.      Past Medical History:  Diagnosis Date  . Asthma   . Bipolar disorder (HCC)   . Hypertension   . Schizophrenia [facility])     Patient Active Problem List   Diagnosis Date Noted  . Tobacco use disorder 04/04/2016  . Schizoaffective disorder, bipolar type (HCC) 04/20/2012    Past Surgical History:  Procedure Laterality Date  . KNEE SURGERY         Home Medications    Prior to Admission medications   Medication Sig Start Date End Date Taking? Authorizing Provider  montelukast (SINGULAIR) 10 MG tablet Take 10 mg by mouth daily as needed. For allergies.  01/07/16  Yes Historical Provider, MD  QUEtiapine (SEROQUEL XR) 400 MG 24 hr tablet Take 2 tablets (800 mg total) by mouth at bedtime. 04/10/16  Yes [name] B Pucilowska, MD  traZODone (DESYREL) 100 MG tablet Take 1 tablet (100 mg total) by mouth at bedtime as needed for sleep. 04/10/16  Yes Shari Prows, MD    Family History History reviewed. No pertinent family history.  Social History Social History  Substance Use Topics  . Smoking status: Current Some Day Smoker    Packs/day: 0.50    Years: 10.00    Types: Cigarettes, Cigars  . Smokeless tobacco: Never Used  . Alcohol use No     Comment: Pt stated that he rarely consumes alcohol     Allergies   Vistaril [hydroxyzine]   Review of Systems Review of Systems  Unable to perform ROS: Psychiatric disorder     Physical Exam Updated Vital Signs BP (!) 161/111 (BP Location: Right Arm)   Pulse 90   Temp 98.4 F (36.9 C) (Oral)   Resp 18   SpO2 99%   Physical Exam  Constitutional: He is oriented to person, place, and time. He appears well-developed and well-nourished. No distress.  HENT:  Head: Normocephalic and atraumatic.  Right Ear: External ear normal.  Left Ear: External ear normal.  Mouth/Throat: Oropharynx  is clear and moist. No oropharyngeal exudate.  Eyes: EOM are normal. Pupils are equal, round, and reactive to light.  Neck: Normal range of motion. Neck supple.  Cardiovascular: Normal rate, regular rhythm, normal heart sounds and intact distal pulses.   No murmur heard. Pulmonary/Chest: Effort normal. No respiratory distress. He has no wheezes. He has no rales.  Abdominal: Soft. He exhibits no distension. There is no tenderness.  Musculoskeletal: He exhibits no edema.  Neurological: He is alert and oriented to person, place, and time.  Skin: Skin is warm and dry. No rash noted. He is not diaphoretic.  Psychiatric: His speech is tangential. He is actively hallucinating. Thought content is paranoid and  delusional. He expresses no homicidal and no suicidal ideation. He expresses no suicidal plans and no homicidal plans.  Vitals reviewed.    ED Treatments / Results  Labs (all labs ordered are listed, but only abnormal results are displayed) Labs Reviewed - No data to display  EKG  EKG Interpretation None       Radiology No results found.  Procedures Procedures (including critical care time)  Medications Ordered in ED Medications - No data to display   Initial Impression / Assessment and Plan / ED Course  I have reviewed the triage vital signs and the nursing notes.  Pertinent labs & imaging results that were available during my care of the patient were reviewed by me and considered in my medical decision making (see chart for details).  Clinical Course    Patient was seen and evaluated in stable condition. Patient had been seen earlier in the day and had laboratory studies done that were unremarkable. Patient appears medically clear and stable. At this time this is the patient's second visit to the emergency Department in 1 day. It appears that he would benefit from psychiatric help. He is not homicidal or suicidal. He is in agreement with plan to talk to behavioral health. TTS was consult it. Disposition pending TTS consultation. Patient does not appear to be a danger or harm to himself or to anyone else.  Final Clinical Impressions(s) / ED Diagnoses   Final diagnoses:  Hallucinations  Auditory hallucination    New Prescriptions New Prescriptions   No medications on file     Leta Baptist, MD 04/23/16 [phone]

## 2016-04-24 ENCOUNTER — Encounter (HOSPITAL_COMMUNITY): Payer: Self-pay | Admitting: Emergency Medicine

## 2016-04-24 ENCOUNTER — Emergency Department (HOSPITAL_COMMUNITY)
Admission: EM | Admit: 2016-04-24 | Discharge: 2016-04-24 | Disposition: A | Discharge status: Home / Self Care | Payer: Medicaid Other | Attending: Emergency Medicine | Admitting: Emergency Medicine

## 2016-04-24 ENCOUNTER — Emergency Department (HOSPITAL_COMMUNITY)
Admission: EM | Admit: 2016-04-24 | Discharge: 2016-04-25 | Disposition: A | Discharge status: Home / Self Care | Payer: Medicaid Other | Attending: Emergency Medicine | Admitting: Emergency Medicine

## 2016-04-24 DIAGNOSIS — R440 Auditory hallucinations: Secondary | ICD-10-CM | POA: Diagnosis present

## 2016-04-24 DIAGNOSIS — I10 Essential (primary) hypertension: Secondary | ICD-10-CM | POA: Insufficient documentation

## 2016-04-24 DIAGNOSIS — J45909 Unspecified asthma, uncomplicated: Secondary | ICD-10-CM | POA: Insufficient documentation

## 2016-04-24 DIAGNOSIS — Z79899 Other long term (current) drug therapy: Secondary | ICD-10-CM | POA: Insufficient documentation

## 2016-04-24 DIAGNOSIS — F17210 Nicotine dependence, cigarettes, uncomplicated: Secondary | ICD-10-CM | POA: Insufficient documentation

## 2016-04-24 DIAGNOSIS — R443 Hallucinations, unspecified: Secondary | ICD-10-CM

## 2016-04-24 DIAGNOSIS — F919 Conduct disorder, unspecified: Secondary | ICD-10-CM | POA: Insufficient documentation

## 2016-04-24 DIAGNOSIS — R441 Visual hallucinations: Secondary | ICD-10-CM | POA: Insufficient documentation

## 2016-04-24 DIAGNOSIS — F250 Schizoaffective disorder, bipolar type: Secondary | ICD-10-CM

## 2016-04-24 LAB — CBC
HEMATOCRIT: 41.4 % (ref 39.0–52.0)
Hemoglobin: 14.4 g/dL (ref 13.0–17.0)
MCH: 33.1 pg (ref 26.0–34.0)
MCHC: 34.8 g/dL (ref 30.0–36.0)
MCV: 95.2 fL (ref 78.0–100.0)
Platelets: 150 10*3/uL (ref 150–400)
RBC: 4.35 MIL/uL (ref 4.22–5.81)
RDW: 13.3 % (ref 11.5–15.5)
WBC: 6.4 10*3/uL (ref 4.0–10.5)

## 2016-04-24 LAB — CBC WITH DIFFERENTIAL/PLATELET
BASOS ABS: 0 10*3/uL (ref 0.0–0.1)
Basophils Relative: 0 %
EOS ABS: 0.4 10*3/uL (ref 0.0–0.7)
EOS PCT: 6 %
HCT: 41.9 % (ref 39.0–52.0)
Hemoglobin: 15 g/dL (ref 13.0–17.0)
LYMPHS PCT: 46 %
Lymphs Abs: 3.1 10*3/uL (ref 0.7–4.0)
MCH: 34.2 pg — ABNORMAL HIGH (ref 26.0–34.0)
MCHC: 35.8 g/dL (ref 30.0–36.0)
MCV: 95.4 fL (ref 78.0–100.0)
Monocytes Absolute: 0.5 10*3/uL (ref 0.1–1.0)
Monocytes Relative: 7 %
Neutro Abs: 2.7 10*3/uL (ref 1.7–7.7)
Neutrophils Relative %: 41 %
PLATELETS: 163 10*3/uL (ref 150–400)
RBC: 4.39 MIL/uL (ref 4.22–5.81)
RDW: 12.9 % (ref 11.5–15.5)
WBC: 6.6 10*3/uL (ref 4.0–10.5)

## 2016-04-24 LAB — COMPREHENSIVE METABOLIC PANEL
ALK PHOS: 78 U/L (ref 38–126)
ALT: 23 U/L (ref 17–63)
ALT: 26 U/L (ref 17–63)
ANION GAP: 7 (ref 5–15)
AST: 23 U/L (ref 15–41)
AST: 24 U/L (ref 15–41)
Albumin: 4.2 g/dL (ref 3.5–5.0)
Albumin: 4.3 g/dL (ref 3.5–5.0)
Alkaline Phosphatase: 79 U/L (ref 38–126)
Anion gap: 8 (ref 5–15)
BILIRUBIN TOTAL: 0.5 mg/dL (ref 0.3–1.2)
BUN: 18 mg/dL (ref 6–20)
BUN: 21 mg/dL — AB (ref 6–20)
CALCIUM: 8.6 mg/dL — AB (ref 8.9–10.3)
CHLORIDE: 106 mmol/L (ref 101–111)
CO2: 24 mmol/L (ref 22–32)
CO2: 24 mmol/L (ref 22–32)
CREATININE: 1.15 mg/dL (ref 0.61–1.24)
Calcium: 8.9 mg/dL (ref 8.9–10.3)
Chloride: 106 mmol/L (ref 101–111)
Creatinine, Ser: 1.06 mg/dL (ref 0.61–1.24)
GFR calc Af Amer: >60 mL/min (ref >60)
GFR calc non Af Amer: >60 mL/min (ref >60)
GFR calc non Af Amer: >60 mL/min (ref >60)
GLUCOSE: 96 mg/dL (ref 65–99)
Glucose, Bld: 93 mg/dL (ref 65–99)
Potassium: 4.3 mmol/L (ref 3.5–5.1)
Potassium: 4 mmol/L (ref 3.5–5.1)
SODIUM: 138 mmol/L (ref 135–145)
Sodium: 137 mmol/L (ref 135–145)
TOTAL PROTEIN: 7.6 g/dL (ref 6.5–8.1)
Total Bilirubin: 0.5 mg/dL (ref 0.3–1.2)
Total Protein: 7.6 g/dL (ref 6.5–8.1)

## 2016-04-24 LAB — RAPID URINE DRUG SCREEN, HOSP PERFORMED
AMPHETAMINES: NOT DETECTED
Barbiturates: NOT DETECTED
Benzodiazepines: NOT DETECTED
Cocaine: NOT DETECTED
Opiates: NOT DETECTED
TETRAHYDROCANNABINOL: NOT DETECTED

## 2016-04-24 LAB — ACETAMINOPHEN LEVEL

## 2016-04-24 LAB — SALICYLATE LEVEL

## 2016-04-24 LAB — ETHANOL: Alcohol, Ethyl (B): <5 mg/dL (ref <5)

## 2016-04-24 MED ORDER — QUETIAPINE FUMARATE ER 200 MG PO TB24
400.0000 mg | ORAL_TABLET | Freq: Once | ORAL | Status: AC
  Administered 2016-04-24: 400 mg via ORAL
  Filled 2016-04-24: qty 2

## 2016-04-24 NOTE — ED Notes (Signed)
Dc instructions given to pt. Pt getting upset about going home, pt states he just go Sero[place] and he is no good to go home at this time, pt requesting to stay on the room to sleep.
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
## 2016-04-24 NOTE — ED Notes (Signed)
Unable to locate pt. at waiting area several times.  

## 2016-04-24 NOTE — Discharge Instructions (Signed)
Schizophrenia Ryan York, see your regular doctor within 3 days for close follow up. If symptoms worsen, come back to the ED immediately. Thank you. Schizophrenia is a mental illness. It may cause disturbed or disorganized thinking, speech, or behavior. People with schizophrenia have problems functioning in one or more areas of life: work, school, home, or relationships. People with schizophrenia are at increased risk for suicide, certain chronic physical illnesses, and unhealthy behaviors, such as smoking and drug use. People who have family members with schizophrenia are at higher risk of developing the illness. Schizophrenia affects men and women equally but usually appears at an earlier age (teenage or early adult years) in men.  SYMPTOMS The earliest symptoms are often subtle (prodrome) and may go unnoticed until the illness becomes more severe (first-break psychosis). Symptoms of schizophrenia may be continuous or may come and go in severity. Episodes often are triggered by major life events, such as family stress, college, [employer], marriage, pregnancy or child birth, divorce, or loss of a loved one. People with schizophrenia may see, hear, or feel things that do not exist (hallucinations). They may have false beliefs in spite of obvious proof to the contrary (delusions). Sometimes speech is incoherent or behavior is odd or withdrawn.  DIAGNOSIS Schizophrenia is diagnosed through an assessment by your caregiver. Your caregiver will ask questions about your thoughts, behavior, mood, and ability to function in daily life. Your caregiver may ask questions about your medical history and use of alcohol or drugs, including prescription medication. Your caregiver may also order blood tests and imaging exams. Certain medical conditions and substances can cause symptoms that resemble schizophrenia. Your caregiver may refer you to a mental health specialist for evaluation. There are three major criterion  for a diagnosis of schizophrenia: Two or more of the following five symptoms are present for a month or longer: Delusions. Often the delusions are that you are being attacked, harassed, cheated, persecuted or conspired against (persecutory delusions). Hallucinations.   Disorganized speech that does not make sense to others. Grossly disorganized (confused or unfocused) behavior or extremely overactive or underactive motor activity (catatonia). Negative symptoms such as bland or blunted emotions (flat affect), loss of will power (avolition), and withdrawal from social contacts (social isolation). Level of functioning in one or more major areas of life (work, school, relationships, or self-care) is markedly below the level of functioning before the onset of illness.   There are continuous signs of illness (either mild symptoms or decreased level of functioning) for at least 6 months or longer. TREATMENT  Schizophrenia is a long-term illness. It is best controlled with continuous treatment rather than treatment only when symptoms occur. The following treatments are used to manage schizophrenia: Medication--Medication is the most effective and important form of treatment for schizophrenia. Antipsychotic medications are usually prescribed to help manage schizophrenia. Other types of medication may be added to relieve any symptoms that may occur despite the use of antipsychotic medications. Counseling or talk therapy--Individual, group, or family counseling may be helpful in providing education, support, and guidance. Many people with schizophrenia also benefit from social skills and job skills (vocational) training. A combination of medication and counseling is best for managing the disorder over time. A procedure in which electricity is applied to the brain through the scalp (electroconvulsive therapy) may be used to treat catatonic schizophrenia or schizophrenia in people who cannot take or do not respond  to medication and counseling.   This information is not intended to replace advice given  to you by your health care provider. Make sure you discuss any questions you have with your health care provider.   Document Released: 09/12/2000 Document Revised: 10/06/2014 Document Reviewed: 12/08/2012 Elsevier Interactive Patient Education [employer]

## 2016-04-24 NOTE — ED Triage Notes (Signed)
Patient reports auditory hallucinations , requesting psychiatric evaluation , denies suicidal or homicidal ideations.

## 2016-04-24 NOTE — ED Notes (Signed)
Pt continues to get verbally abusive with Dr. Mora Bellman. Stating that he will like to press charges on [place] for sending him home when he is no ready to go. Security at the bedside.

## 2016-04-24 NOTE — ED Provider Notes (Signed)
[facility]-EMERGENCY DEPT Provider Note   CSN: [id number] Arrival date & time: 04/24/16  1027  First Provider Contact:  None       History   Chief Complaint Chief Complaint  Patient presents with  . Behavior Problem  . Hallucinations    HPI JOANATHAN AFFELDT is a 45 y.o. male PMH schizophrenia, bipolar, here with auditory hallucinations.  Patient states this is an ongoing issue that is not new.  He has visual hallucinations as well.  He is taking seroquel and is complaint but states it is not working.  He denies any SI or HI.  He denies alcohol or illicit drugs tonight.  He has no medical complaints such as fever, cough, vomiting or diarrhea.  He has no further complaints.  10 Systems reviewed and are negative for acute change except as noted in the HPI.   HPI  Past Medical History:  Diagnosis Date  . Asthma   . Bipolar disorder (HCC)   . Hypertension   . Schizophrenia [facility])     Patient Active Problem List   Diagnosis Date Noted  . Tobacco use disorder 04/04/2016  . Schizoaffective disorder, bipolar type (HCC) 04/20/2012    Past Surgical History:  Procedure Laterality Date  . KNEE SURGERY         Home Medications    Prior to Admission medications   Medication Sig Start Date End Date Taking? Authorizing Provider  montelukast (SINGULAIR) 10 MG tablet Take 10 mg by mouth daily as needed. For allergies. 01/07/16  Yes Historical Provider, MD  QUEtiapine (SEROQUEL XR) 400 MG 24 hr tablet Take 2 tablets (800 mg total) by mouth at bedtime. 04/10/16  Yes [name] B Pucilowska, MD  traZODone (DESYREL) 100 MG tablet Take 1 tablet (100 mg total) by mouth at bedtime as needed for sleep. 04/10/16   Shari Prows, MD    Family History No family history on file.  Social History Social History  Substance Use Topics  . Smoking status: Current Some Day Smoker    Packs/day: 0.50    Years: 10.00    Types: Cigarettes, Cigars  . Smokeless tobacco: Never Used  . Alcohol use No      Comment: Pt stated that he rarely consumes alcohol     Allergies   Vistaril [hydroxyzine]   Review of Systems Review of Systems   Physical Exam Updated Vital Signs BP 118/88 (BP Location: Left Arm)   Pulse 86   Temp 98.8 F (37.1 C) (Oral)   Resp 16   Ht  (1.88 m)   Wt 207 lb (93.9 kg)   SpO2 99%   BMI 26.58 kg/m   Physical Exam  Constitutional: He is oriented to person, place, and time. Vital signs are normal. He appears well-developed and well-nourished.  Non-toxic appearance. He does not appear ill. No distress.  HENT:  Head: Normocephalic and atraumatic.  Nose: Nose normal.  Mouth/Throat: Oropharynx is clear and moist. No oropharyngeal exudate.  Eyes: Conjunctivae and EOM are normal. Pupils are equal, round, and reactive to light. No scleral icterus.  Neck: Normal range of motion. Neck supple. No tracheal deviation, no edema, no erythema and normal range of motion present. No thyroid mass and no thyromegaly present.  Cardiovascular: Normal rate, regular rhythm, S1 normal, S2 normal, normal heart sounds, intact distal pulses and normal pulses.  Exam reveals no gallop and no friction rub.   No murmur heard. Pulmonary/Chest: Effort normal and breath sounds normal. No respiratory distress. He has  no wheezes. He has no rhonchi. He has no rales.  Abdominal: Soft. Normal appearance and bowel sounds are normal. He exhibits no distension, no ascites and no mass. There is no hepatosplenomegaly. There is no tenderness. There is no rebound, no guarding and no CVA tenderness.  Musculoskeletal: Normal range of motion. He exhibits no edema or tenderness.  Lymphadenopathy:    He has no cervical adenopathy.  Neurological: He is alert and oriented to person, place, and time. He has normal strength. No cranial nerve deficit or sensory deficit.  Skin: Skin is warm, dry and intact. No petechiae and no rash noted. He is not diaphoretic. No erythema. No pallor.  Psychiatric:  No SI  or HI  Nursing note and vitals reviewed.    ED Treatments / Results  Labs (all labs ordered are listed, but only abnormal results are displayed) Labs Reviewed  CBC WITH DIFFERENTIAL/PLATELET - Abnormal; Notable for the following:       Result Value   MCH 34.2 (*)    All other components within normal limits  COMPREHENSIVE METABOLIC PANEL - Abnormal; Notable for the following:    BUN 21 (*)    All other components within normal limits  URINE RAPID DRUG SCREEN, HOSP PERFORMED  ETHANOL    EKG  EKG Interpretation None       Radiology No results found.  Procedures Procedures (including critical care time)  Medications Ordered in ED Medications  QUEtiapine (SEROQUEL XR) 24 hr tablet 400 mg (not administered)     Initial Impression / Assessment and Plan / ED Course  I have reviewed the triage vital signs and the nursing notes.  Pertinent labs & imaging results that were available during my care of the patient were reviewed by me and considered in my medical decision making (see chart for details).  Clinical Course    Patient presents to the ED for hallucinations.  He denies SI or HI.  He has no medical complaints and physical exam is normal.  I do not believe patients needs a TTS consult.  He states he missed his home seroquel dose tonight, this was given to him.     6:34 AM Patient upset after being notified by myself for discharge. He continues to deny SI or HI.  He was advised he does not have an emergent condition warranting admission or TTS consult.  Advised to fu within PCP or psychiatry.  He appears well and in NAD. VS remain within his normal limits and he is safe for DC.  Final Clinical Impressions(s) / ED Diagnoses   Final diagnoses:  None    New Prescriptions New Prescriptions   No medications on file     Tomasita Crumble, MD 04/24/16 [phone]

## 2016-04-25 ENCOUNTER — Encounter (HOSPITAL_COMMUNITY): Payer: Self-pay | Admitting: Emergency Medicine

## 2016-04-25 MED ORDER — LORAZEPAM 1 MG PO TABS: 1.0000 mg | ORAL_TABLET | Freq: Three times a day (TID) | ORAL | Status: DC | PRN

## 2016-04-25 MED ORDER — NICOTINE 21 MG/24HR TD PT24: 21.0000 mg | MEDICATED_PATCH | Freq: Every day | TRANSDERMAL | Status: DC

## 2016-04-25 MED ORDER — ONDANSETRON HCL 4 MG PO TABS: 4.0000 mg | ORAL_TABLET | Freq: Three times a day (TID) | ORAL | Status: DC | PRN

## 2016-04-25 MED ORDER — ALUM & MAG HYDROXIDE-SIMETH 200-200-20 MG/5ML PO SUSP: 30.0000 mL | ORAL | Status: DC | PRN

## 2016-04-25 NOTE — BH Assessment (Addendum)
[facility] Assessment Progress Note  Per Thedore Mins, MD, this pt does not require psychiatric hospitalization at this time.  Pt is to be discharged from [facility] with recommendation to follow up with [facility].  [facility] information has been included in pt's discharge instructions.  Pt's nurse, Marylene Land, has been notified.  Doylene Canning, MA Triage Specialist [phone]
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
## 2016-04-25 NOTE — [facility] Suicide Risk Assessment (Signed)
Suicide Risk Assessment  Discharge Assessment   [facility] Discharge Suicide Risk Assessment   Principal Problem: <principal problem not specified> Discharge Diagnoses:  Patient Active Problem List   Diagnosis Date Noted  . Schizoaffective disorder, bipolar type (HCC) [F25.0] 04/20/2012    Priority: High  . Tobacco use disorder [F17.200] 04/04/2016    Total Time spent with patient: 45 minutes  Musculoskeletal: Strength & Muscle Tone: within normal limits Gait & Station: normal Patient leans: N/A  Psychiatric Specialty Exam:   Blood pressure 132/88, pulse 66, temperature 97.7 F (36.5 C), temperature source Oral, resp. rate 18, weight 95.3 kg (210 lb), SpO2 100 %.Body mass index is 26.96 kg/m.  General Appearance: Disheveled  Eye Contact::  Good  Speech:  Normal Rate409  Volume:  Normal  Mood:  Irritable  Affect:  Congruent  Thought Process:  Coherent and Descriptions of Associations: Intact  Orientation:  Full (Time, Place, and Person)  Thought Content:  WDL  Suicidal Thoughts:  No  Homicidal Thoughts:  No  Memory:  Immediate;   Good Recent;   Good Remote;   Good  Judgement:  Fair  Insight:  Fair  Psychomotor Activity:  Normal  Concentration:  Good  Recall:  Good  Fund of Knowledge:Good  Language: Good  Akathisia:  No  Handed:  Right  AIMS (if indicated):     Assets:  Housing Leisure Time Physical Health Resilience Social Support  Sleep:     Cognition: WNL  ADL's:  Intact   Mental Status Per Nursing Assessment::   On Admission:   hallucinations  Demographic Factors:  Male  Loss Factors: NA  Historical Factors: NA  Risk Reduction Factors:   Sense of responsibility to family, Living with another person, especially a relative, Positive social support and Positive therapeutic relationship  Continued Clinical Symptoms:  NOne  Cognitive Features That Contribute To Risk:  None    Suicide Risk:  Minimal: No identifiable suicidal ideation.  Patients  presenting with no risk factors but with morbid ruminations; may be classified as minimal risk based on the severity of the depressive symptoms    Plan Of Care/Follow-up recommendations:  Activity:  as tolerated  Diet:  heart healthy diet  LORD, JAMISON, NP 04/25/2016, 10:10 AM
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
## 2016-04-25 NOTE — ED Notes (Signed)
Patient is A&Ox4, denies SI/HI and A/V hallucinations, interacting appropriately. Patient states he feels good and is ready to go home. All belongings are being returned to patient and patient has been given a bus pass. Patient verbalized understanding of AVS discharge instructions and follow up, no further questions at this time.
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
## 2016-04-25 NOTE — Discharge Instructions (Signed)
For your ongoing mental health needs, you are advised to follow up with [facility].  New and returning patients are seen at their walk-in clinic.  Walk-in hours are Monday - Friday from 8:00 am - 3:00 pm.  Walk-in patients are seen on a first come, first served basis.  Try to arrive as early as possible for he best chance of being seen the same day: ° °     [facility] °     [address] °     [place], [place] [address] °     [phone] °

## 2016-04-27 ENCOUNTER — Emergency Department (HOSPITAL_COMMUNITY)
Admission: EM | Admit: 2016-04-27 | Discharge: 2016-04-27 | Disposition: A | Discharge status: Home / Self Care | Payer: Medicaid Other | Attending: Emergency Medicine | Admitting: Emergency Medicine

## 2016-04-27 ENCOUNTER — Encounter (HOSPITAL_COMMUNITY): Payer: Self-pay

## 2016-04-27 DIAGNOSIS — F17210 Nicotine dependence, cigarettes, uncomplicated: Secondary | ICD-10-CM | POA: Insufficient documentation

## 2016-04-27 DIAGNOSIS — J45909 Unspecified asthma, uncomplicated: Secondary | ICD-10-CM | POA: Diagnosis not present

## 2016-04-27 DIAGNOSIS — I10 Essential (primary) hypertension: Secondary | ICD-10-CM | POA: Insufficient documentation

## 2016-04-27 DIAGNOSIS — F259 Schizoaffective disorder, unspecified: Secondary | ICD-10-CM | POA: Insufficient documentation

## 2016-04-27 DIAGNOSIS — F319 Bipolar disorder, unspecified: Secondary | ICD-10-CM | POA: Diagnosis not present

## 2016-04-27 DIAGNOSIS — Z046 Encounter for general psychiatric examination, requested by authority: Secondary | ICD-10-CM | POA: Diagnosis present

## 2016-04-27 DIAGNOSIS — F250 Schizoaffective disorder, bipolar type: Secondary | ICD-10-CM | POA: Diagnosis present

## 2016-04-27 LAB — CBC WITH DIFFERENTIAL/PLATELET
BASOS PCT: 0 %
Basophils Absolute: 0 10*3/uL (ref 0.0–0.1)
EOS ABS: 0.3 10*3/uL (ref 0.0–0.7)
Eosinophils Relative: 4 %
HCT: 38.7 % — ABNORMAL LOW (ref 39.0–52.0)
Hemoglobin: 13.5 g/dL (ref 13.0–17.0)
Lymphocytes Relative: 34 %
Lymphs Abs: 2.1 10*3/uL (ref 0.7–4.0)
MCH: 33.3 pg (ref 26.0–34.0)
MCHC: 34.9 g/dL (ref 30.0–36.0)
MCV: 95.3 fL (ref 78.0–100.0)
MONO ABS: 0.6 10*3/uL (ref 0.1–1.0)
MONOS PCT: 10 %
Neutro Abs: 3.2 10*3/uL (ref 1.7–7.7)
Neutrophils Relative %: 52 %
Platelets: 141 10*3/uL — ABNORMAL LOW (ref 150–400)
RBC: 4.06 MIL/uL — ABNORMAL LOW (ref 4.22–5.81)
RDW: 13 % (ref 11.5–15.5)
WBC: 6.2 10*3/uL (ref 4.0–10.5)

## 2016-04-27 LAB — ETHANOL

## 2016-04-27 LAB — POTASSIUM: POTASSIUM: 3.9 mmol/L (ref 3.5–5.1)

## 2016-04-27 MED ORDER — QUETIAPINE FUMARATE ER 400 MG PO TB24
800.0000 mg | ORAL_TABLET | Freq: Every day | ORAL | Status: DC
  Filled 2016-04-27: qty 2

## 2016-04-27 MED ORDER — TRAZODONE HCL 100 MG PO TABS: 100.0000 mg | ORAL_TABLET | Freq: Every evening | ORAL | Status: DC | PRN

## 2016-04-27 MED ORDER — ACETAMINOPHEN 325 MG PO TABS
650.0000 mg | ORAL_TABLET | Freq: Once | ORAL | Status: AC
  Administered 2016-04-27: 650 mg via ORAL
  Filled 2016-04-27: qty 2

## 2016-04-27 NOTE — Clinical Social Work Note (Signed)
CSW met with patient to provide homeless resources.  Patient was given a packet with information for job readiness programs, shelters and long term housing programs.  CSW also provided patient with bus pass to help with discharge today.  Dede Query, LCSW [place] Worker - Weekend Coverage cell #: [phone]

## 2016-04-27 NOTE — Consult Note (Signed)
[facility] Face-to-Face Psychiatry Consult   Reason for Consult:  Anger  Referring Physician:  EDP Patient Identification: Ryan York MRN:  [id number] Principal Diagnosis: Schizoaffective disorder, bipolar type Diagnosis:   Patient Active Problem List   Diagnosis Date Noted  . Schizoaffective disorder, bipolar type (HCC) [F25.0] 04/20/2012    Priority: High  . Tobacco use disorder [F17.200] 04/04/2016    Total Time spent with patient: 45 minutes  Subjective:   Ryan York is a 45 y.o. male patient does not warrant admission.  HPI:  45 yo male who has had frequent visits to the ED, predominately for shelter as he claims he "gave up my section 8 housing."  He claims he can live with his parents but wants to move out of [facility].  No suicidal/homicidal ideations, hallucinations, and alcohol/drug abuse.  Stable with discharge with a verbal warning regarding the abuse of emergency services for non-emergent purposes.  Past Psychiatric History: schizoaffective disorder, bipolar type  Risk to Self: Suicidal Ideation: No Suicidal Intent: No Is patient at risk for suicide?: No Suicidal Plan?: No Access to Means: No What has been your use of drugs/alcohol within the last 12 months?: Denies How many times?: 0 Other Self Harm Risks: None Triggers for Past Attempts: None known Intentional Self Injurious Behavior: None Risk to Others: Homicidal Ideation: No Thoughts of Harm to Others: No Comment - Thoughts of Harm to Others: Denies Current Homicidal Intent: No Current Homicidal Plan: No Access to Homicidal Means: No Identified Victim: Denies History of harm to others?: No Assessment of Violence: None Noted Violent Behavior Description: Denies Does patient have access to weapons?: No Criminal Charges Pending?: No Does patient have a court date: No Prior Inpatient Therapy: Prior Inpatient Therapy: Yes Prior Therapy Dates: 2010,2012,2013,2017 Prior Therapy  Facilty/Provider(s): [facility], [facility]  Reason for Treatment: Schizoaffective Disorder Prior Outpatient Therapy: Prior Outpatient Therapy: Yes Prior Therapy Dates: Present Prior Therapy Facilty/Provider([facility]): [facility] Reason for Treatment: Schizoaffective Disorder Does patient have an ACCT team?: Yes Does patient have Intensive In-House Services?  : No Does patient have [facility] services? : No Does patient have P4[facility] services?: No  Past Medical History:  Past Medical History:  Diagnosis Date  . Asthma   . Bipolar disorder (HCC)   . Hypertension   . Schizophrenia [facility])     Past Surgical History:  Procedure Laterality Date  . KNEE SURGERY     Family History: No family history on file. Family Psychiatric  History: none Social History:  History  Alcohol Use  . Yes    Comment: Pt stated that he rarely consumes alcohol     History  Drug Use No    Comment: Pt denies     Social History   Social History  . Marital status: Single    Spouse name: N/A  . Number of children: N/A  . Years of education: N/A   Social History Main Topics  . Smoking status: Current Some Day Smoker    Packs/day: 0.50    Years: 10.00    Types: Cigars  . Smokeless tobacco: Never Used  . Alcohol use Yes     Comment: Pt stated that he rarely consumes alcohol  . Drug use: No     Comment: Pt denies   . Sexual activity: Yes    Birth control/ protection: None   Other Topics Concern  . None   Social History Narrative  . None   Additional Social History:    Allergies:   Allergies  Allergen Reactions  . Vistaril [Hydroxyzine] Nausea Only    Severe stomach pain    Labs:  Results for orders placed or performed during the hospital encounter of 04/27/16 (from the past 48 hour(s))  CBC with Differential     Status: Abnormal   Collection Time: 04/27/16  4:05 AM  Result Value Ref Range   WBC 6.2 4.0 - 10.5 K/uL   RBC 4.06 (L) 4.22 - 5.81 MIL/uL   Hemoglobin 13.5 13.0 - 17.0 g/dL   HCT 96.0 (L) 45.4  - 52.0 %   MCV 95.3 78.0 - 100.0 fL   MCH 33.3 26.0 - 34.0 pg   MCHC 34.9 30.0 - 36.0 g/dL   RDW 09.8 11.9 - 14.7 %   Platelets 141 (L) 150 - 400 K/uL   Neutrophils Relative % 52 %   Neutro Abs 3.2 1.7 - 7.7 K/uL   Lymphocytes Relative 34 %   Lymphs Abs 2.1 0.7 - 4.0 K/uL   Monocytes Relative 10 %   Monocytes Absolute 0.6 0.1 - 1.0 K/uL   Eosinophils Relative 4 %   Eosinophils Absolute 0.3 0.0 - 0.7 K/uL   Basophils Relative 0 %   Basophils Absolute 0.0 0.0 - 0.1 K/uL  Ethanol     Status: None   Collection Time: 04/27/16  4:05 AM  Result Value Ref Range   Alcohol, Ethyl (B) <5 <5 mg/dL    Comment:        LOWEST DETECTABLE LIMIT FOR SERUM ALCOHOL IS 5 mg/dL FOR MEDICAL PURPOSES ONLY   Potassium     Status: None   Collection Time: 04/27/16  4:05 AM  Result Value Ref Range   Potassium 3.9 3.5 - 5.1 mmol/L  I-stat chem 8, ed     Status: Abnormal   Collection Time: 04/27/16  4:12 AM  Result Value Ref Range   Sodium 139 135 - 145 mmol/L   Potassium 7.9 (HH) 3.5 - 5.1 mmol/L   Chloride 104 101 - 111 mmol/L   BUN 33 (H) 6 - 20 mg/dL   Creatinine, Ser 8.29 0.61 - 1.24 mg/dL   Glucose, Bld 562 (H) 65 - 99 mg/dL   Calcium, Ion 1.30 (L) 1.13 - 1.30 mmol/L   TCO2 29 0 - 100 mmol/L   Hemoglobin 13.6 13.0 - 17.0 g/dL   HCT 86.5 78.4 - 69.6 %   Comment NOTIFIED PHYSICIAN     Current Facility-Administered Medications  Medication Dose Route Frequency Provider Last Rate Last Dose  . QUEtiapine (SEROQUEL XR) 24 hr tablet 800 mg  800 mg Oral QHS Earley Favor, NP      . traZODone (DESYREL) tablet 100 mg  100 mg Oral QHS PRN Earley Favor, NP       Current Outpatient Prescriptions  Medication Sig Dispense Refill  . montelukast (SINGULAIR) 10 MG tablet Take 10 mg by mouth daily as needed. For allergies.  0  . QUEtiapine (SEROQUEL XR) 400 MG 24 hr tablet Take 2 tablets (800 mg total) by mouth at bedtime. 60 tablet 1  . traZODone (DESYREL) 100 MG tablet Take 1 tablet (100 mg total) by mouth  at bedtime as needed for sleep. (Patient not taking: Reported on 04/27/2016) 30 tablet 1    Musculoskeletal: Strength & Muscle Tone: within normal limits Gait & Station: normal Patient leans: N/A  Psychiatric Specialty Exam: Physical Exam  Constitutional: He is oriented to person, place, and time. He appears well-developed and well-nourished.  HENT:  Head: Normocephalic.  Neck: Normal range  of motion.  Respiratory: Effort normal.  Musculoskeletal: Normal range of motion.  Neurological: He is alert and oriented to person, place, and time.  Skin: Skin is warm and dry.  Psychiatric: He has a normal mood and affect. His speech is normal and behavior is normal. Judgment and thought content normal. Cognition and memory are normal.    Review of Systems  Constitutional: Negative.   HENT: Negative.   Eyes: Negative.   Respiratory: Negative.   Cardiovascular: Negative.   Gastrointestinal: Negative.   Genitourinary: Negative.   Musculoskeletal: Negative.   Skin: Negative.   Neurological: Negative.   Endo/Heme/Allergies: Negative.   Psychiatric/Behavioral: Negative.     Blood pressure 122/81, pulse 65, temperature 98 F (36.7 C), temperature source Oral, resp. rate 16, SpO2 95 %.There is no height or weight on file to calculate BMI.  General Appearance: Casual  Eye Contact:  Good  Speech:  Normal Rate  Volume:  Normal  Mood:  Angry  Affect:  Congruent  Thought Process:  Coherent and Descriptions of Associations: Intact  Orientation:  Full (Time, Place, and Person)  Thought Content:  WDL  Suicidal Thoughts:  No  Homicidal Thoughts:  No  Memory:  Immediate;   Good Recent;   Good Remote;   Good  Judgement:  Fair  Insight:  Fair  Psychomotor Activity:  Increased  Concentration:  Concentration: Good and Attention Span: Good  Recall:  Good  Fund of Knowledge:  Fair  Language:  Good  Akathisia:  No  Handed:  Right  AIMS (if indicated):     Assets:  Leisure Time Physical  Health Resilience  ADL's:  Intact  Cognition:  WNL  Sleep:        Treatment Plan Summary: Daily contact with patient to assess and evaluate symptoms and progress in treatment, Medication management and Plan schizoaffective disorder, bipolar type:  -Crisis stabilization -Medication management:  Restarted home medications of Seroquel 800 mg at bedtime for sleep and Trazodone 100 mg at bedtime for sleep PRN. -Individual counseling  Disposition: No evidence of imminent risk to self or others at present.    Nanine Means, NP 04/27/2016 8:52 AM  Patient seen face-to-face for psychiatric evaluation, chart reviewed and case discussed with the physician extender and developed treatment plan. Reviewed the information documented and agree with the treatment plan. Thedore Mins, MD

## 2016-04-27 NOTE — Discharge Instructions (Addendum)
Schizoaffective Disorder  Schizoaffective disorder (ScAD) is a mental illness. It causes symptoms that are a mixture of schizophrenia (a psychotic disorder) and an affective (mood) disorder. The schizophrenic symptoms may include delusions, hallucinations, or odd behavior. The mood symptoms may be similar to major depression or bipolar disorder. ScAD may interfere with personal relationships or normal daily activities. People with ScAD are at increased risk for job loss, social isolation, physical health problems, anxiety and substance use disorders, and suicide.  ScAD usually occurs in cycles. Periods of severe symptoms are followed by periods of  less severe symptoms or improvement. The illness affects men and women equally but usually appears at an earlier age (teenage or early adult years) in men. People who have family members with schizophrenia, bipolar disorder, or ScAD are at higher risk of developing ScAD.  SYMPTOMS   At any one time, people with ScAD may have psychotic symptoms only or both psychotic and mood symptoms. The psychotic symptoms include one or more of the following:  · Hearing, seeing, or feeling things that are not there (hallucinations).    · Having fixed, false beliefs (delusions). The delusions usually are of being attacked, harassed, cheated, persecuted, or conspired against (paranoid delusions).  · Speaking in a way that makes no sense to others (disorganized speech).  The psychotic symptoms of ScAD may also include confusing or odd behavior or any of the negative symptoms of schizophrenia. These include loss of motivation for normal daily activities, such as bathing or grooming, withdrawal from other people, and lack of emotions.     The mood symptoms of ScAD occur more often than not. They resemble major depressive disorder or bipolar mania. Symptoms of major depression include depressed mood and four or more of the following:  · Loss of interest in usually pleasurable activities  (anhedonia).  · Sleeping more or less than normal.  · Feeling worthless or excessively guilty.  · Lack of energy or motivation.  · Trouble concentrating.  · Eating more or less than usual.  · Thinking a lot about death or suicide.  Symptoms of bipolar mania include abnormally elevated or irritable mood and increased energy or activity, plus three or more of the following:    · More confidence than normal or feeling that you are able to do anything (grandiosity).  · Feeling rested with less sleep than normal.    · Being easily distracted.    · Talking more than usual or feeling pressured to keep talking.    · Feeling that your thoughts are racing.  · Engaging in high-risk activities such as buying sprees or foolish business decisions.  DIAGNOSIS   ScAD is diagnosed through an assessment by your health care provider. Your health care provider will observe and ask questions about your thoughts, behavior, mood, and ability to function in daily life. Your health care provider may also ask questions about your medical history and use of drugs, including prescription medicines. Your health care provider may also order blood tests and imaging exams. Certain medical conditions and substances can cause symptoms that resemble ScAD. Your health care provider may refer you to a mental health specialist for evaluation.   ScAD is divided into two types. The depressive type is diagnosed if your mood symptoms are limited to major depression. The bipolar type is diagnosed if your mood symptoms are manic or a mixture of manic and depressive symptoms  TREATMENT   ScAD is usually a lifelong illness. Long-term treatment is necessary. The following treatments are available:  · Medicine. Different types of   medicine are used to treat ScAD. The exact combination depends on the type and severity of your symptoms. Antipsychotic medicine is used to control psychotic symptoms such as delusions, paranoia, and hallucinations. Mood stabilizers can  even the highs and lows of bipolar manic mood swings. Antidepressant medicines are used to treat major depressive symptoms.  · Counseling or talk therapy. Individual, group, or family counseling may be helpful in providing education, support, and guidance. Many people with ScAD also benefit from social skills and job skills (vocational) training.  A combination of medicine and counseling is usually best for managing the disorder over time. A procedure in which electricity is applied to the brain through the scalp (electroconvulsive therapy) may be used to treat people with severe manic symptoms that do not respond to medicine and counseling.  HOME CARE INSTRUCTIONS   · Take all your medicine as prescribed.  · Check with your health care provider before starting new prescription or over-the-counter medicines.  · Keep all follow up appointments with your health care provider.  SEEK MEDICAL CARE IF:   · If you are not able to take your medicines as prescribed.  · If your symptoms get worse.  SEEK IMMEDIATE MEDICAL CARE IF:   · You have serious thoughts about hurting yourself or others.     This information is not intended to replace advice given to you by your health care provider. Make sure you discuss any questions you have with your health care provider.     Document Released: 01/26/2007 Document Revised: 10/06/2014 Document Reviewed: 04/29/2013  [employer] Interactive Patient Education ©2016 [employer]

## 2016-04-27 NOTE — BH Assessment (Addendum)
Assessment Note  Ryan York is an 45 y.o. male presenting to [facility] voluntarily reporting that he was having a "build up and my head was throbbing" due to missing the dose of Seroquel last night. Patient denies SI and history of atetmpts. Patient denies self injurious behaviors. Patient denies HI and history of aggression. Patient denies access to firearms. Patient denies pending charges and upcoming court dates. Patient states that he does not have any current stressors. Patient denies feeling depression and denies symptoms. Patient states that he has had hallucinations since 1995 and he hears voices that make him think something bad is going to happen to his family. Patient states that he understands that nothing is going to happen to his family.  Patient states that he has his medications with him but has not taken the missed dose. Patient denies that the voices are command in nature. Patient states that he feels paranoid at times but denies being paranoid at this time. Patient does not appear to be responding to internal stimuli. Patient had his eyes closed during the assessment and had to be woken up and redirected to wake up and answer questions. Patient states that he can take his missed dose.    Patient states that he has had services with [facility] for 4 years. Patient states that he had his last appointment Friday and his next appointment is Thursday. Patient states that he last inpatient treatment was earlier this year.   Consulted with Maryjean Morn, PA_C who states that patient does not meet inpatient criteria and recommends CSW consult to address patients ongoing homelessness.   Diagnosis: Schizoaffective Disorder   Past Medical History:  Past Medical History:  Diagnosis Date  . Asthma   . Bipolar disorder (HCC)   . Hypertension   . Schizophrenia [name])     Past Surgical History:  Procedure Laterality Date  . KNEE SURGERY      Family History: No family history  on file.  Social History:  reports that he has been smoking Cigars.  He has a 5.00 pack-year smoking history. He has never used smokeless tobacco. He reports that he drinks alcohol. He reports that he does not use drugs.  Additional Social History:  Alcohol / Drug Use Pain Medications: Denies Prescriptions: Denies Over the Counter: Denies History of alcohol / drug use?: No history of alcohol / drug abuse  CIWA: CIWA-Ar BP: 126/82 Pulse Rate: 76 COWS:    Allergies:  Allergies  Allergen Reactions  . Vistaril [Hydroxyzine] Nausea Only    Severe stomach pain    Home Medications:  (Not in a hospital admission)  OB/GYN Status:  No LMP for male patient.  General Assessment Data [facility] of [facility]: [facility] TTS Assessment: In system Is this a Tele or Face-to-Face Assessment?: Face-to-Face Is this an Initial Assessment or a Re-assessment for this encounter?: Initial Assessment Marital status: Single Is patient pregnant?: No Pregnancy Status: No Living Arrangements: Other (Comment) (homeless) Can pt return to current living arrangement?: Yes Admission Status: Voluntary Is patient capable of signing voluntary admission?: Yes Referral Source: Self/Family/Friend     Crisis Care Plan Living Arrangements: Other (Comment) (homeless) Name of Psychiatrist: [facility] (Seroquel [facility]) Name of Therapist: [facility] (4 years last appt last Friday next is Thursday)  Education Status Is patient currently in school?: Yes Current Grade: General Studies Highest grade of school patient has completed: 85 Name of school: [employer]  Risk to self with the past 6 months Suicidal Ideation:  No Has patient been a risk to self within the past 6 months prior to admission? : No Suicidal Intent: No Has patient had any suicidal intent within the past 6 months prior to admission? : No Is patient at risk for suicide?: No Suicidal Plan?: No Has patient had any suicidal plan within the past  6 months prior to admission? : No Access to Means: No What has been your use of drugs/alcohol within the last 12 months?: Denies Previous Attempts/Gestures: No How many times?: 0 Other Self Harm Risks: None Triggers for Past Attempts: None known Intentional Self Injurious Behavior: None Family Suicide History: No Recent stressful life event(s):  (denies) Persecutory voices/beliefs?: No Depression: No Depression Symptoms:  (denies symptoms) Substance abuse history and/or treatment for substance abuse?: No Suicide prevention information given to non-admitted patients: Not applicable  Risk to Others within the past 6 months Homicidal Ideation: No Does patient have any lifetime risk of violence toward others beyond the six months prior to admission? : No Thoughts of Harm to Others: No Comment - Thoughts of Harm to Others: Denies Current Homicidal Intent: No Current Homicidal Plan: No Access to Homicidal Means: No Identified Victim: Denies History of harm to others?: No Assessment of Violence: None Noted Violent Behavior Description: Denies Does patient have access to weapons?: No Criminal Charges Pending?: No Does patient have a court date: No Is patient on probation?: No  Psychosis Hallucinations: Auditory ("make me think something is happening to my family") Delusions: Unspecified  Mental Status Report Appearance/Hygiene: Bizarre Eye Contact: Unable to Assess Motor Activity: Unremarkable Speech: Logical/coherent Level of Consciousness: Alert Mood: Pleasant Affect: Appropriate to circumstance Anxiety Level: None Thought Processes: Coherent, Relevant Judgement: Partial Orientation: Person, Place, Time, Situation, Appropriate for developmental age Obsessive Compulsive Thoughts/Behaviors: None  Cognitive Functioning Concentration: Normal Memory: Recent Intact, Remote Intact IQ: Average Insight: Poor Impulse Control: Fair Appetite: Fair Sleep: No Change Total Hours  of Sleep: 7 Vegetative Symptoms: None  ADLScreening [facility] Services) Patient's cognitive ability adequate to safely complete daily activities?: Yes Patient able to express need for assistance with ADLs?: Yes Independently performs ADLs?: No  Prior Inpatient Therapy Prior Inpatient Therapy: Yes Prior Therapy Dates: 2010,2012,2013,2017 Prior Therapy Facilty/Provider(s): [facility], [facility]  Reason for Treatment: Schizoaffective Disorder  Prior Outpatient Therapy Prior Outpatient Therapy: Yes Prior Therapy Dates: Present Prior Therapy Facilty/Provider(s): [facility] Reason for Treatment: Schizoaffective Disorder Does patient have an ACCT team?: Yes Does patient have Intensive In-House Services?  : No Does patient have [facility] services? : No Does patient have P4CC services?: No  ADL Screening (condition at time of admission) Patient's cognitive ability adequate to safely complete daily activities?: Yes Is the patient deaf or have difficulty hearing?: No Does the patient have difficulty seeing, even when wearing glasses/contacts?: No Does the patient have difficulty concentrating, remembering, or making decisions?: No Patient able to express need for assistance with ADLs?: Yes Does the patient have difficulty dressing or bathing?: No Independently performs ADLs?: No Weakness of Legs: Left Weakness of Arms/Hands: None  Home Assistive Devices/Equipment Home Assistive Devices/Equipment: Cane (specify quad or straight)    Abuse/Neglect Assessment (Assessment to be complete while patient is alone) Physical Abuse: Denies Verbal Abuse: Denies Sexual Abuse: Denies Exploitation of patient/patient's resources: Denies Self-Neglect: Denies Values / Beliefs Cultural Requests During Hospitalization: None Spiritual Requests During Hospitalization: None Consults Spiritual Care Consult Needed: No Social Work Consult Needed: No [employer]chant navy officer (For Healthcare) Does patient have an  advance directive?: No Would patient like information on  creating an advanced directive?: No - patient declined information    Additional Information 1:1 In Past 12 Months?: No CIRT Risk: No Elopement Risk: No Does patient have medical clearance?: Yes     Disposition:  Disposition Initial Assessment Completed for this Encounter: Yes Disposition of Patient: Other dispositions (does not meet inaptient criteria per Serafina Royals, PA-C ) Type of inpatient treatment program: Adult (CSW consult for homeless resources )  On Site Evaluation by:   Reviewed with Physician:    Lilyana Lippman 04/27/2016 7:12 AM

## 2016-04-27 NOTE — ED Notes (Signed)
SW called to assess patient.

## 2016-04-27 NOTE — ED Provider Notes (Signed)
[facility] Provider Note   CSN: [id number] Arrival date & time: 04/27/16  9563  First Provider Contact:  None       History   Chief Complaint Chief Complaint  Patient presents with  . Medical Clearance    HPI Ryan York is a 45 y.o. male.  This a 45 year old male with a long-standing history of schizophrenia who states that since moving in with his parents this month.  He is been seen in the hospital emergency room multiple times.  He's not been taking his medications well.  He has some.  He says he just hasn't been able to take them on a consistent basis.  He reports that he has auditory and visual hallucinations.  He states that they pick on him.  The pinch him a pro time in the temples but they do not tell him to do anything to others or to harm himself. He states that as of August 1.  He will have a place to stay with friends and he also will be getting a paycheck to help move his belongings back into storage.      Past Medical History:  Diagnosis Date  . Asthma   . Bipolar disorder (HCC)   . Hypertension   . Schizophrenia [facility])     Patient Active Problem List   Diagnosis Date Noted  . Tobacco use disorder 04/04/2016  . Schizoaffective disorder, bipolar type (HCC) 04/20/2012    Past Surgical History:  Procedure Laterality Date  . KNEE SURGERY         Home Medications    Prior to Admission medications   Medication Sig Start Date End Date Taking? Authorizing Provider  montelukast (SINGULAIR) 10 MG tablet Take 10 mg by mouth daily as needed. For allergies. 01/07/16   Historical Provider, MD  QUEtiapine (SEROQUEL XR) 400 MG 24 hr tablet Take 2 tablets (800 mg total) by mouth at bedtime. 04/10/16   Shari Prows, MD  traZODone (DESYREL) 100 MG tablet Take 1 tablet (100 mg total) by mouth at bedtime as needed for sleep. 04/10/16   Shari Prows, MD    Family History No family history on file.  Social History Social History    Substance Use Topics  . Smoking status: Current Some Day Smoker    Packs/day: 0.50    Years: 10.00    Types: Cigars  . Smokeless tobacco: Never Used  . Alcohol use Yes     Comment: Pt stated that he rarely consumes alcohol     Allergies   Vistaril [hydroxyzine]   Review of Systems Review of Systems  Constitutional: Negative for fatigue and fever.  Respiratory: Negative for shortness of breath.   Cardiovascular: Negative for chest pain.  Gastrointestinal: Negative for abdominal pain.  Neurological: Negative for dizziness.  Psychiatric/Behavioral: Positive for hallucinations.  All other systems reviewed and are negative.    Physical Exam Updated Vital Signs BP 130/84 (BP Location: Right Arm)   Pulse 80   Temp 98 F (36.7 C) (Oral)   SpO2 100%   Physical Exam  Constitutional: He is oriented to person, place, and time. He appears well-developed and well-nourished.  HENT:  Head: Normocephalic.  Eyes: Pupils are equal, round, and reactive to light.  Neck: Normal range of motion.  Cardiovascular: Normal rate.   Pulmonary/Chest: Effort normal.  Musculoskeletal: Normal range of motion.  Neurological: He is alert and oriented to person, place, and time.  Skin: Skin is warm and dry.  Psychiatric: He  has a normal mood and affect. His speech is normal and behavior is normal. Thought content is delusional. Cognition and memory are normal. He expresses impulsivity. He expresses no homicidal and no suicidal ideation. He expresses no suicidal plans and no homicidal plans.  Nursing note and vitals reviewed.    ED Treatments / Results  Labs (all labs ordered are listed, but only abnormal results are displayed) Labs Reviewed - No data to display  EKG  EKG Interpretation None       Radiology No results found.  Procedures Procedures (including critical care time)  Medications Ordered in ED Medications - No data to display   Initial Impression / Assessment and Plan  / ED Course  I have reviewed the triage vital signs and the nursing notes.  Pertinent labs & imaging results that were available during my care of the patient were reviewed by me and considered in my medical decision making (see chart for details).  Clinical Course   Initial potassium draw was hemolyzed redraw shows that he's had a normal potassium.  He is medically cleared for psychiatric evaluation    Final Clinical Impressions(s) / ED Diagnoses   Final diagnoses:  None    New Prescriptions New Prescriptions   No medications on file     Earley Favor, NP 04/27/16 0357    Earley Favor, NP 04/27/16 7824    Gilda Crease, MD 04/27/16 [phone]

## 2016-04-27 NOTE — BH Assessment (Signed)
Assessment completed. Consulted with Maryjean Morn, PA-C who states that patient does not meet criteria for admission and recommends that he see CSW for homeless resources in the morning.   Davina Poke, LCSW Therapeutic Triage Specialist [facility] 04/27/2016 6:49 AM

## 2016-04-27 NOTE — ED Triage Notes (Signed)
Patient picked up over at [facility] per EMS.  Patient went there to be able to take his medications.  Per EMS patient takes Seroquel and needs somewhere to take it.  Patient informed EMS that is having A/V/H.

## 2016-04-27 NOTE — ED Notes (Signed)
Patient states he is hearing voices "they are demons".  He states that he is seeing "them".  Patient states he is recently homeless and tried to stay with his parents tonight and was not able too.  Patient states that has not taken his medication and needs to take his Seroquel but, needed to have a safe place to take medications.  Denies SI/HI at this time.

## 2016-04-27 NOTE — ED Notes (Signed)
Patient sleeping, breathing even and unlabored, NAD at this time. 

## 2016-04-27 NOTE — ED Provider Notes (Signed)
Assumed Care from Earley Favor, NP. Ryan York is a 45 y.o. male with h/o schizophrenia presents to ED with complaint of auditory and visual hallucinations. He has not been consistent with taking his medications. He denies SI/HI. He has 12 visits to the ED in the last 6 months. Patient is medically cleared. Awaiting TTS consult regarding disposition.   8:26 AM: Cleared by TTS. Consult to social work regarding homelessness.   11:20 AM: SW saw patient and provided resources for shelters. Patient had breakfast. He is alert and interactive. Patient complained of heel pain, tylenol given. Patient states he is ready to leave. Per [facility] notes he has appointment with [facility] on Thursday.  Patient safe for D/c.   Lona Kettle, PA-C 04/27/16 [address] [place], [place] 04/27/16 [address] [place], [place] 04/27/16 1128    Gilda Crease, MD 05/02/16 [phone]

## 2016-04-27 NOTE — BHH Suicide Risk Assessment (Signed)
Suicide Risk Assessment  Discharge Assessment   [facility] Discharge Suicide Risk Assessment   Principal Problem: Schizoaffective disorder, bipolar type [facility]) Discharge Diagnoses:  Patient Active Problem List   Diagnosis Date Noted  . Schizoaffective disorder, bipolar type (HCC) [F25.0] 04/20/2012    Priority: High  . Tobacco use disorder [F17.200] 04/04/2016    Total Time spent with patient: 45 minutes    Musculoskeletal: Strength & Muscle Tone: within normal limits Gait & Station: normal Patient leans: N/A  Psychiatric Specialty Exam: Physical Exam  Constitutional: He is oriented to person, place, and time. He appears well-developed and well-nourished.  HENT:  Head: Normocephalic.  Neck: Normal range of motion.  Respiratory: Effort normal.  Musculoskeletal: Normal range of motion.  Neurological: He is alert and oriented to person, place, and time.  Skin: Skin is warm and dry.  Psychiatric: He has a normal mood and affect. His speech is normal and behavior is normal. Judgment and thought content normal. Cognition and memory are normal.    Review of Systems  Constitutional: Negative.   HENT: Negative.   Eyes: Negative.   Respiratory: Negative.   Cardiovascular: Negative.   Gastrointestinal: Negative.   Genitourinary: Negative.   Musculoskeletal: Negative.   Skin: Negative.   Neurological: Negative.   Endo/Heme/Allergies: Negative.   Psychiatric/Behavioral: Negative.     Blood pressure 122/81, pulse 65, temperature 98 F (36.7 C), temperature source Oral, resp. rate 16, SpO2 95 %.There is no height or weight on file to calculate BMI.  General Appearance: Casual  Eye Contact:  Good  Speech:  Normal Rate  Volume:  Normal  Mood:  Angry  Affect:  Congruent  Thought Process:  Coherent and Descriptions of Associations: Intact  Orientation:  Full (Time, Place, and Person)  Thought Content:  WDL  Suicidal Thoughts:  No  Homicidal Thoughts:  No  Memory:  Immediate;    Good Recent;   Good Remote;   Good  Judgement:  Fair  Insight:  Fair  Psychomotor Activity:  Increased  Concentration:  Concentration: Good and Attention Span: Good  Recall:  Good  Fund of Knowledge:  Fair  Language:  Good  Akathisia:  No  Handed:  Right  AIMS (if indicated):     Assets:  Leisure Time Physical Health Resilience  ADL's:  Intact  Cognition:  WNL  Sleep:       Mental Status Per Nursing Assessment::   On Admission:   Medication issues  Demographic Factors:  Male  Loss Factors: NA  Historical Factors: NA  Risk Reduction Factors:   Sense of responsibility to family, Positive social support and Positive therapeutic relationship  Continued Clinical Symptoms:  NOne  Cognitive Features That Contribute To Risk:  None    Suicide Risk:  Minimal: No identifiable suicidal ideation.  Patients presenting with no risk factors but with morbid ruminations; may be classified as minimal risk based on the severity of the depressive symptoms    Plan Of Care/Follow-up recommendations:  Activity:  as tolerated Diet:  heart healthy diet  Tessia Kassin, NP 04/27/2016, 9:00 AM

## 2016-04-27 NOTE — ED Notes (Signed)
Prior to discharge pt c/o left heel pain.  Order for Tylenol obtained.

## 2016-04-28 LAB — I-STAT CHEM 8, ED
BUN: 33 mg/dL — ABNORMAL HIGH (ref 6–20)
CALCIUM ION: 1.04 mmol/L — AB (ref 1.13–1.30)
Chloride: 104 mmol/L (ref 101–111)
Creatinine, Ser: 1.1 mg/dL (ref 0.61–1.24)
Glucose, Bld: 108 mg/dL — ABNORMAL HIGH (ref 65–99)
HEMATOCRIT: 40 % (ref 39.0–52.0)
Hemoglobin: 13.6 g/dL (ref 13.0–17.0)
Potassium: 7.9 mmol/L (ref 3.5–5.1)
SODIUM: 139 mmol/L (ref 135–145)
TCO2: 29 mmol/L (ref 0–100)

## 2016-05-06 ENCOUNTER — Encounter (HOSPITAL_COMMUNITY): Payer: Self-pay | Admitting: Emergency Medicine

## 2016-05-06 ENCOUNTER — Emergency Department (HOSPITAL_COMMUNITY)
Admission: EM | Admit: 2016-05-06 | Discharge: 2016-05-06 | Disposition: A | Discharge status: Home / Self Care | Payer: Medicaid Other | Attending: Emergency Medicine | Admitting: Emergency Medicine

## 2016-05-06 DIAGNOSIS — J45909 Unspecified asthma, uncomplicated: Secondary | ICD-10-CM | POA: Insufficient documentation

## 2016-05-06 DIAGNOSIS — F17210 Nicotine dependence, cigarettes, uncomplicated: Secondary | ICD-10-CM | POA: Diagnosis not present

## 2016-05-06 DIAGNOSIS — I10 Essential (primary) hypertension: Secondary | ICD-10-CM | POA: Diagnosis not present

## 2016-05-06 DIAGNOSIS — R441 Visual hallucinations: Secondary | ICD-10-CM | POA: Diagnosis present

## 2016-05-06 DIAGNOSIS — Z79899 Other long term (current) drug therapy: Secondary | ICD-10-CM | POA: Diagnosis not present

## 2016-05-06 DIAGNOSIS — F250 Schizoaffective disorder, bipolar type: Secondary | ICD-10-CM | POA: Diagnosis not present

## 2016-05-06 LAB — ETHANOL: Alcohol, Ethyl (B): <5 mg/dL (ref <5)

## 2016-05-06 LAB — COMPREHENSIVE METABOLIC PANEL
ALK PHOS: 69 U/L (ref 38–126)
ALT: 22 U/L (ref 17–63)
AST: 22 U/L (ref 15–41)
Albumin: 4.2 g/dL (ref 3.5–5.0)
Anion gap: 6 (ref 5–15)
BUN: 9 mg/dL (ref 6–20)
CALCIUM: 8.9 mg/dL (ref 8.9–10.3)
CHLORIDE: 106 mmol/L (ref 101–111)
CO2: 27 mmol/L (ref 22–32)
CREATININE: 1.01 mg/dL (ref 0.61–1.24)
Glucose, Bld: 109 mg/dL — ABNORMAL HIGH (ref 65–99)
Potassium: 4.4 mmol/L (ref 3.5–5.1)
Sodium: 139 mmol/L (ref 135–145)
Total Bilirubin: 0.7 mg/dL (ref 0.3–1.2)
Total Protein: 7.3 g/dL (ref 6.5–8.1)

## 2016-05-06 LAB — RAPID URINE DRUG SCREEN, HOSP PERFORMED
Amphetamines: NOT DETECTED
Barbiturates: NOT DETECTED
Benzodiazepines: NOT DETECTED
COCAINE: NOT DETECTED
OPIATES: NOT DETECTED
TETRAHYDROCANNABINOL: NOT DETECTED

## 2016-05-06 LAB — CBC
HCT: 40.6 % (ref 39.0–52.0)
HEMOGLOBIN: 14 g/dL (ref 13.0–17.0)
MCH: 33 pg (ref 26.0–34.0)
MCHC: 34.5 g/dL (ref 30.0–36.0)
MCV: 95.8 fL (ref 78.0–100.0)
PLATELETS: 166 10*3/uL (ref 150–400)
RBC: 4.24 MIL/uL (ref 4.22–5.81)
RDW: 13.5 % (ref 11.5–15.5)
WBC: 6.6 10*3/uL (ref 4.0–10.5)

## 2016-05-06 LAB — ACETAMINOPHEN LEVEL: Acetaminophen (Tylenol), Serum: <10 ug/mL — ABNORMAL LOW (ref 10–30)

## 2016-05-06 LAB — SALICYLATE LEVEL: Salicylate Lvl: <4 mg/dL (ref 2.8–30.0)

## 2016-05-06 MED ORDER — ACETAMINOPHEN 325 MG PO TABS
650.0000 mg | ORAL_TABLET | ORAL | Status: DC | PRN
Start: 2016-05-06 — End: 2016-05-06

## 2016-05-06 MED ORDER — LORAZEPAM 1 MG PO TABS: 1.0000 mg | ORAL_TABLET | Freq: Three times a day (TID) | ORAL | Status: DC | PRN

## 2016-05-06 MED ORDER — NICOTINE 21 MG/24HR TD PT24: 21.0000 mg | MEDICATED_PATCH | Freq: Every day | TRANSDERMAL | Status: DC

## 2016-05-06 MED ORDER — ALUM & MAG HYDROXIDE-SIMETH 200-200-20 MG/5ML PO SUSP: 30.0000 mL | ORAL | Status: DC | PRN

## 2016-05-06 MED ORDER — QUETIAPINE FUMARATE ER 400 MG PO TB24: 800.0000 mg | ORAL_TABLET | Freq: Every day | ORAL | Status: DC

## 2016-05-06 MED ORDER — ONDANSETRON HCL 4 MG PO TABS: 4.0000 mg | ORAL_TABLET | Freq: Three times a day (TID) | ORAL | Status: DC | PRN

## 2016-05-06 MED ORDER — IBUPROFEN 200 MG PO TABS: 600.0000 mg | ORAL_TABLET | Freq: Three times a day (TID) | ORAL | Status: DC | PRN

## 2016-05-06 MED ORDER — ZOLPIDEM TARTRATE 5 MG PO TABS
5.0000 mg | ORAL_TABLET | Freq: Every evening | ORAL | Status: DC | PRN
Start: 2016-05-06 — End: 2016-05-06

## 2016-05-06 NOTE — ED Notes (Signed)
SBAR Report received from previous nurse. Pt received calm and visible on unit. Pt denies current SI/ HI, A/V H, depression, anxiety, and pain at this time, but endorses seeing angels and is otherwise stable. Pt reminded of camera surveillance, q 15 min rounds, and rules of the milieu. Will continue to assess.

## 2016-05-06 NOTE — BHH Suicide Risk Assessment (Signed)
Suicide Risk Assessment  Discharge Assessment   [facility]   Principal Problem: Schizoaffective disorder, bipolar type [facility](HCC) Discharge Diagnoses:  Patient Active Problem List   Diagnosis Date Noted  . Schizoaffective disorder, bipolar type (HCC) [F25.0] 04/20/2012    Priority: High  . Tobacco use disorder [F17.200] 04/04/2016    Total Time spent with patient: 45 minutes  Musculoskeletal: Strength & Muscle Tone: within normal limits Gait & Station: normal Patient leans: N/A  Psychiatric Specialty Exam: Physical Exam  Constitutional: He is oriented to person, place, and time. He appears well-developed and well-nourished.  HENT:  Head: Normocephalic.  Neck: Normal range of motion.  Respiratory: Effort normal.  Musculoskeletal: Normal range of motion.  Neurological: He is alert and oriented to person, place, and time.  Skin: Skin is warm and dry.  Psychiatric: His speech is normal and behavior is normal. Judgment and thought content normal. His affect is angry. Cognition and memory are normal.    Review of Systems  Constitutional: Negative.   HENT: Negative.   Eyes: Negative.   Respiratory: Negative.   Cardiovascular: Negative.   Gastrointestinal: Negative.   Genitourinary: Negative.   Musculoskeletal: Negative.   Skin: Negative.   Neurological: Negative.   Endo/Heme/Allergies: Negative.   Psychiatric/Behavioral: Negative.     Blood pressure 125/83, pulse 92, temperature 97.8 F (36.6 C), temperature source Oral, resp. rate 20, SpO2 99 %.There is no height or weight on file to calculate BMI.  General Appearance: Disheveled  Eye Contact:  Good  Speech:  Normal Rate  Volume:  Normal  Mood:  Angry and Irritable  Affect:  Congruent  Thought Process:  Coherent and Descriptions of Associations: Intact  Orientation:  Full (Time, Place, and Person)  Thought Content:  WDL  Suicidal Thoughts:  No  Homicidal Thoughts:  No  Memory:  Immediate;    Good Recent;   Good Remote;   Good  Judgement:  Fair  Insight:  Fair  Psychomotor Activity:  Normal  Concentration:  Concentration: Good and Attention Span: Good  Recall:  Good  Fund of Knowledge:  Fair  Language:  Good  Akathisia:  No  Handed:  Right  AIMS (if indicated):     Assets:  Leisure Time Physical Health Resilience Social Support  ADL's:  Intact  Cognition:  WNL  Sleep:      Mental Status Per Nursing Assessment::   On Admission:   hallucinations  Demographic Factors:  Male  Loss Factors: NA  Historical Factors: NA  Risk Reduction Factors:   Sense of responsibility to family, Positive social support and Positive therapeutic relationship  Continued Clinical Symptoms:  Angry   Cognitive Features That Contribute To Risk:  None    Suicide Risk:  Minimal: No identifiable suicidal ideation.  Patients presenting with no risk factors but with morbid ruminations; may be classified as minimal risk based on the severity of the depressive symptoms    Plan Of Care/Follow-up recommendations:  Activity:  as tolerated Diet:  heart healthy diet  Dex Blakely, NP 05/06/2016, 8:49 AM

## 2016-05-06 NOTE — ED Notes (Signed)
Pt has 1 black [employer] book bag and a red and black overnight pass.  Pt has a permenant bus pass in wallet in book bag.

## 2016-05-06 NOTE — Discharge Instructions (Signed)
For your ongoing mental health needs, you are advised to follow up with [facility].  New and returning patients are seen at their walk-in clinic.  Walk-in hours are Monday - Friday from 8:00 am - 3:00 pm.  Walk-in patients are seen on a first come, first served basis.  Try to arrive as early as possible for he best chance of being seen the same day: ° °     [facility] °     [address] °     [place], [place] [address] °     [phone] °

## 2016-05-06 NOTE — ED Notes (Signed)
Pt ready to be discharged, and discharged to home without difficulty. All belongings returned to pt who signed for same. Pt not delusional or psychotic; Not responding to internal stimuli. Denies SI/HI.

## 2016-05-06 NOTE — ED Provider Notes (Signed)
[facility] DEPT Provider Note   CSN: [id number] Arrival date & time: 05/06/16  [id number]  By signing my name below, I, Ryan York, attest that this documentation has been prepared under the direction and in the presence of Ryan Boozeavid Mekiah Cambridge, MD. Electronically Signed: Phillis HaggisGabriella York, ED Scribe. 05/06/16. 3:30 AM.  First Provider Contact:  First MD Initiated Contact with Patient 05/06/16 0325     History   Chief Complaint No chief complaint on file.   The history is provided by the patient. No language interpreter was used.  HPI Comments: Ryan York is a 45 y.o. Male with a hx of bipolar disorder, HTN, and schizophrenia who presents to the Emergency Department requesting medical clearance. Pt states that there are angels that are trying to hurt him and kidnap him. He states that he has been taking his prescribed medications as directed and has not missed a dose. Pt states that he smokes cigars. He denies drug or alcohol use, HI, SI, or other hallucinations.   Past Medical History:  Diagnosis Date  . Asthma   . Bipolar disorder (HCC)   . Hypertension   . Schizophrenia [facility](HCC)     Patient Active Problem List   Diagnosis Date Noted  . Tobacco use disorder 04/04/2016  . Schizoaffective disorder, bipolar type (HCC) 04/20/2012    Past Surgical History:  Procedure Laterality Date  . KNEE SURGERY         Home Medications    Prior to Admission medications   Medication Sig Start Date End Date Taking? Authorizing Provider  montelukast (SINGULAIR) 10 MG tablet Take 10 mg by mouth daily as needed. For allergies. 01/07/16  Yes Historical Provider, MD  QUEtiapine (SEROQUEL XR) 400 MG 24 hr tablet Take 2 tablets (800 mg total) by mouth at bedtime. 04/10/16  Yes [name] B Pucilowska, MD  traZODone (DESYREL) 100 MG tablet Take 1 tablet (100 mg total) by mouth at bedtime as needed for sleep. Patient not taking: Reported on 04/27/2016 04/10/16   Shari ProwsJolanta B Pucilowska, MD    Family  History History reviewed. No pertinent family history.  Social History Social History  Substance Use Topics  . Smoking status: Current Some Day Smoker    Packs/day: 0.50    Years: 10.00    Types: Cigars  . Smokeless tobacco: Never Used  . Alcohol use Yes     Comment: Pt stated that he rarely consumes alcohol     Allergies   Vistaril [hydroxyzine]   Review of Systems Review of Systems  Psychiatric/Behavioral: Positive for hallucinations. Negative for suicidal ideas.  All other systems reviewed and are negative.  Physical Exam Updated Vital Signs BP 118/75 (BP Location: Right Arm)   Pulse 84   Temp 97.5 F (36.4 C) (Oral)   Resp 15   SpO2 98%   Physical Exam  Constitutional: He is oriented to person, place, and time. He appears well-developed and well-nourished.  HENT:  Head: Normocephalic and atraumatic.  Eyes: EOM are normal. Pupils are equal, round, and reactive to light.  Neck: Normal range of motion. Neck supple. No JVD present.  Cardiovascular: Normal rate, regular rhythm and normal heart sounds.   No murmur heard. Pulmonary/Chest: Effort normal and breath sounds normal. He has no wheezes. He has no rales. He exhibits no tenderness.  Abdominal: Soft. He exhibits no distension and no mass. There is no tenderness.  Musculoskeletal: Normal range of motion. He exhibits no edema.  Lymphadenopathy:    He has no cervical adenopathy.  Neurological:  He is alert and oriented to person, place, and time. No cranial nerve deficit. He exhibits normal muscle tone. Coordination normal.  Skin: Skin is warm and dry. No rash noted.  Psychiatric: He is actively hallucinating.  Exhibiting visual hallucinations  Nursing note and vitals reviewed.    ED Treatments / Results  DIAGNOSTIC STUDIES: Oxygen Saturation is 98% on RA, normal by my interpretation.    COORDINATION OF CARE: 3:29 AM-Discussed treatment plan which includes labs and TTS consult with pt at bedside and pt  agreed to plan.    Labs (all labs ordered are listed, but only abnormal results are displayed) Labs Reviewed  COMPREHENSIVE METABOLIC PANEL - Abnormal; Notable for the following:       Result Value   Glucose, Bld 109 (*)    All other components within normal limits  ACETAMINOPHEN LEVEL - Abnormal; Notable for the following:    Acetaminophen (Tylenol), Serum <10 (*)    All other components within normal limits  ETHANOL  SALICYLATE LEVEL  CBC  URINE RAPID DRUG SCREEN, HOSP PERFORMED    Procedures Procedures (including critical care time)  Medications Ordered in ED Medications - No data to display   Initial Impression / Assessment and Plan / ED Course  I have reviewed the triage vital signs and the nursing notes.  Pertinent labs that were available during my care of the patient were reviewed by me and considered in my medical decision making (see chart for details).  Clinical Course    Schizophrenia with visual hallucinations. No homicidal or suicidal ideation, but he is clearly in need of treatment of his schizophrenia. He is admitted to the psychiatric ED for evaluation by TTS. Old records are reviewed and he has multiple ED visits and hospitalizations related to his schizophrenia.  Final Clinical Impressions(s) / ED Diagnoses   Final diagnoses:  Schizoaffective disorder, bipolar type (HCC)  I personally performed the services described in this documentation, which was scribed in my presence. The recorded information has been reviewed and is accurate.     New Prescriptions New Prescriptions   No medications on file     Ryan Booze, MD 05/07/16 [phone]

## 2016-05-06 NOTE — Consult Note (Signed)
[place] [facility] Consult   Reason for Consult:  Hallucinations Referring Physician:  EDP Patient Identification: Ryan York MRN:  [id number] Principal Diagnosis: Schizoaffective disorder, bipolar type [facility]) Diagnosis:   Patient Active Problem List   Diagnosis Date Noted  . Schizoaffective disorder, bipolar type ([place]) [F25.0] 04/20/2012    Priority: High  . Tobacco use disorder [F17.200] 04/04/2016    Total Time spent with patient: 45 minutes  Subjective:   Ryan York is a 45 y.o. male patient does not warrant admission.  HPI:  45 yo male who reported seeing angels on admission.  Today, he is demanding of services with no hallucinations.  He is well known to this ED for coming and sleeping while telling other patients what to say to gain admission.  His biggest issue is being homeless.  The abuse of emergency services was discussed with him.  He became angry and calling this provider a racist along with a TTS worker that is his same race.  No suicidal/homicidal ideations, hallucinations, or alcohol/drug abuse.    Past Psychiatric History: schizoaffective disorder  Risk to Self: Is patient at risk for suicide?: No, but patient needs Medical Clearance Risk to Others:  none Prior Inpatient Therapy:  [facility] several times Prior Outpatient Therapy:  [facility]  Past Medical History:  Past Medical History:  Diagnosis Date  . Asthma   . Bipolar disorder ([place])   . Hypertension   . Schizophrenia [facility])     Past Surgical History:  Procedure Laterality Date  . KNEE SURGERY     Family History: History reviewed. No pertinent family history. Family Psychiatric  History: none Social History:  History  Alcohol Use  . Yes    Comment: Pt stated that he rarely consumes alcohol     History  Drug Use No    Comment: Pt denies     Social History   Social History  . Marital status: Single    Spouse name: N/A  . Number of children: N/A  . Years of education: N/A    Social History Main Topics  . Smoking status: Current Some Day Smoker    Packs/day: 0.50    Years: 10.00    Types: Cigars  . Smokeless tobacco: Never Used  . Alcohol use Yes     Comment: Pt stated that he rarely consumes alcohol  . Drug use: No     Comment: Pt denies   . Sexual activity: Yes    Birth control/ protection: None   Other Topics Concern  . None   Social History Narrative  . None   Additional Social History:    Allergies:   Allergies  Allergen Reactions  . Vistaril [Hydroxyzine] Nausea Only    Severe stomach pain    Labs:  Results for orders placed or performed during the hospital encounter of 05/06/16 (from the past 48 hour(s))  Comprehensive metabolic panel     Status: Abnormal   Collection Time: 05/06/16  1:34 AM  Result Value Ref Range   Sodium 139 135 - 145 mmol/L   Potassium 4.4 3.5 - 5.1 mmol/L   Chloride 106 101 - 111 mmol/L   CO2 27 22 - 32 mmol/L   Glucose, Bld 109 (H) 65 - 99 mg/dL   BUN 9 6 - 20 mg/dL   Creatinine, Ser 1.01 0.61 - 1.24 mg/dL   Calcium 8.9 8.9 - 10.3 mg/dL   Total Protein 7.3 6.5 - 8.1 g/dL   Albumin 4.2 3.5 - 5.0 g/dL  AST 22 15 - 41 U/L   ALT 22 17 - 63 U/L   Alkaline Phosphatase 69 38 - 126 U/L   Total Bilirubin 0.7 0.3 - 1.2 mg/dL   GFR calc non Af Amer >60 >60 mL/min   GFR calc Af Amer >60 >60 mL/min    Comment: (NOTE) The eGFR has been calculated using the CKD EPI equation. This calculation has not been validated in all clinical situations. eGFR's persistently <60 mL/min signify possible Chronic Kidney Disease.    Anion gap 6 5 - 15  cbc     Status: None   Collection Time: 05/06/16  1:34 AM  Result Value Ref Range   WBC 6.6 4.0 - 10.5 K/uL   RBC 4.24 4.22 - 5.81 MIL/uL   Hemoglobin 14.0 13.0 - 17.0 g/dL   HCT 40.6 39.0 - 52.0 %   MCV 95.8 78.0 - 100.0 fL   MCH 33.0 26.0 - 34.0 pg   MCHC 34.5 30.0 - 36.0 g/dL   RDW 13.5 11.5 - 15.5 %   Platelets 166 150 - 400 K/uL  Ethanol     Status: None    Collection Time: 05/06/16  1:36 AM  Result Value Ref Range   Alcohol, Ethyl (B) <5 <5 mg/dL    Comment:        LOWEST DETECTABLE LIMIT FOR SERUM ALCOHOL IS 5 mg/dL FOR MEDICAL PURPOSES ONLY   Salicylate level     Status: None   Collection Time: 05/06/16  1:36 AM  Result Value Ref Range   Salicylate Lvl <0.9 2.8 - 30.0 mg/dL  Acetaminophen level     Status: Abnormal   Collection Time: 05/06/16  1:36 AM  Result Value Ref Range   Acetaminophen (Tylenol), Serum <10 (L) 10 - 30 ug/mL    Comment:        THERAPEUTIC CONCENTRATIONS VARY SIGNIFICANTLY. A RANGE OF 10-30 ug/mL MAY BE AN EFFECTIVE CONCENTRATION FOR MANY PATIENTS. HOWEVER, SOME ARE BEST TREATED AT CONCENTRATIONS OUTSIDE THIS RANGE. ACETAMINOPHEN CONCENTRATIONS >150 ug/mL AT 4 HOURS AFTER INGESTION AND >50 ug/mL AT 12 HOURS AFTER INGESTION ARE OFTEN ASSOCIATED WITH TOXIC REACTIONS.   Rapid urine drug screen (hospital performed)     Status: None   Collection Time: 05/06/16  3:29 AM  Result Value Ref Range   Opiates NONE DETECTED NONE DETECTED   Cocaine NONE DETECTED NONE DETECTED   Benzodiazepines NONE DETECTED NONE DETECTED   Amphetamines NONE DETECTED NONE DETECTED   Tetrahydrocannabinol NONE DETECTED NONE DETECTED   Barbiturates NONE DETECTED NONE DETECTED    Comment:        DRUG SCREEN FOR MEDICAL PURPOSES ONLY.  IF CONFIRMATION IS NEEDED FOR ANY PURPOSE, NOTIFY LAB WITHIN 5 DAYS.        LOWEST DETECTABLE LIMITS FOR URINE DRUG SCREEN Drug Class       Cutoff (ng/mL) Amphetamine      1000 Barbiturate      200 Benzodiazepine   295 Tricyclics       747 Opiates          300 Cocaine          300 THC              50     Current Facility-Administered Medications  Medication Dose Route Frequency Provider Last Rate Last Dose  . acetaminophen (TYLENOL) tablet 650 mg  650 mg Oral B4Y PRN Delora Fuel, MD      . alum & mag hydroxide-simeth (MAALOX/MYLANTA) 200-200-20 MG/5ML suspension  30 mL  30 mL Oral PRN Delora Fuel, MD      . ibuprofen (ADVIL,MOTRIN) tablet 600 mg  600 mg Oral J5K PRN Delora Fuel, MD      . LORazepam (ATIVAN) tablet 1 mg  1 mg Oral K9F PRN Delora Fuel, MD      . nicotine (NICODERM CQ - dosed in mg/24 hours) patch 21 mg  21 mg Transdermal Daily Delora Fuel, MD      . ondansetron [facility]) tablet 4 mg  4 mg Oral G1W PRN Delora Fuel, MD      . QUEtiapine (SEROQUEL XR) 24 hr tablet 800 mg  800 mg Oral QHS Delora Fuel, MD      . zolpidem [facility]) tablet 5 mg  5 mg Oral QHS PRN Delora Fuel, MD       Current Outpatient Prescriptions  Medication Sig Dispense Refill  . montelukast (SINGULAIR) 10 MG tablet Take 10 mg by mouth daily as needed. For allergies.  0  . QUEtiapine (SEROQUEL XR) 400 MG 24 hr tablet Take 2 tablets (800 mg total) by mouth at bedtime. 60 tablet 1  . traZODone (DESYREL) 100 MG tablet Take 1 tablet (100 mg total) by mouth at bedtime as needed for sleep. (Patient not taking: Reported on 04/27/2016) 30 tablet 1    Musculoskeletal: Strength & Muscle Tone: within normal limits Gait & Station: normal Patient leans: N/A  Psychiatric Specialty Exam: Physical Exam  Constitutional: He is oriented to person, place, and time. He appears well-developed and well-nourished.  HENT:  Head: Normocephalic.  Neck: Normal range of motion.  Respiratory: Effort normal.  Musculoskeletal: Normal range of motion.  Neurological: He is alert and oriented to person, place, and time.  Skin: Skin is warm and dry.  Psychiatric: His speech is normal and behavior is normal. Judgment and thought content normal. His affect is angry. Cognition and memory are normal.    Review of Systems  Constitutional: Negative.   HENT: Negative.   Eyes: Negative.   Respiratory: Negative.   Cardiovascular: Negative.   Gastrointestinal: Negative.   Genitourinary: Negative.   Musculoskeletal: Negative.   Skin: Negative.   Neurological: Negative.   Endo/Heme/Allergies: Negative.   Psychiatric/Behavioral:  Negative.     Blood pressure 125/83, pulse 92, temperature 97.8 F (36.6 C), temperature source Oral, resp. rate 20, SpO2 99 %.There is no height or weight on file to calculate BMI.  General Appearance: Disheveled  Eye Contact:  Good  Speech:  Normal Rate  Volume:  Normal  Mood:  Angry and Irritable  Affect:  Congruent  Thought Process:  Coherent and Descriptions of Associations: Intact  Orientation:  Full (Time, Place, and Person)  Thought Content:  WDL  Suicidal Thoughts:  No  Homicidal Thoughts:  No  Memory:  Immediate;   Good Recent;   Good Remote;   Good  Judgement:  Fair  Insight:  Fair  Psychomotor Activity:  Normal  Concentration:  Concentration: Good and Attention Span: Good  Recall:  Good  Fund of Knowledge:  Fair  Language:  Good  Akathisia:  No  Handed:  Right  AIMS (if indicated):     Assets:  Leisure Time Physical Health Resilience Social Support  ADL's:  Intact  Cognition:  WNL  Sleep:        Treatment Plan Summary: Daily contact with patient to assess and evaluate symptoms and progress in treatment, Medication management and Plan schizoaffective disorder, bipolar type:  -Crisis stabilization -Medication management:  Continue medical  medications along with Seroquel 800 mg at bedtime for mood and psychosis, Trazodone 100 mg PRN at bedtime for sleep. -Individual counseling  Disposition: No evidence of imminent risk to self or others at present.    Waylan Boga, NP 05/06/2016 8:42 AM

## 2016-05-06 NOTE — ED Triage Notes (Addendum)
Patient came in complaining of seeing angels. Patient stated he wanted to hurt somebody but that was not the important thing. The important thing was that I am seeing angels. Patient also stated he took Seroquel when he was at home.

## 2016-05-06 NOTE — BH Assessment (Signed)
Clinician unable to assess pt. Pt sleeping upon arrival and continued to fall asleep and snore throughout assessment attempt. PT to be assessed at later time.

## 2016-05-06 NOTE — BH Assessment (Signed)
[facility] Assessment Progress Note  Per Nanine MeansJamison Lord, DNP, this pt does not require psychiatric hospitalization at this time.  [facility]  This has been included in pt's discharge instructions.  Pt's nurse, Diane, has been notified.  Doylene Canninghomas Undine Nealis, MA Triage Specialist [phone]

## 2016-06-13 ENCOUNTER — Emergency Department (HOSPITAL_COMMUNITY)
Admission: EM | Admit: 2016-06-13 | Discharge: 2016-06-14 | Disposition: A | Discharge status: Home / Self Care | Payer: Medicaid Other | Attending: Emergency Medicine | Admitting: Emergency Medicine

## 2016-06-13 ENCOUNTER — Encounter (HOSPITAL_COMMUNITY): Payer: Self-pay

## 2016-06-13 DIAGNOSIS — I10 Essential (primary) hypertension: Secondary | ICD-10-CM | POA: Insufficient documentation

## 2016-06-13 DIAGNOSIS — F17210 Nicotine dependence, cigarettes, uncomplicated: Secondary | ICD-10-CM | POA: Insufficient documentation

## 2016-06-13 DIAGNOSIS — F6089 Other specific personality disorders: Secondary | ICD-10-CM | POA: Diagnosis not present

## 2016-06-13 DIAGNOSIS — F29 Unspecified psychosis not due to a substance or known physiological condition: Secondary | ICD-10-CM | POA: Insufficient documentation

## 2016-06-13 DIAGNOSIS — J45909 Unspecified asthma, uncomplicated: Secondary | ICD-10-CM | POA: Diagnosis not present

## 2016-06-13 DIAGNOSIS — Z79899 Other long term (current) drug therapy: Secondary | ICD-10-CM | POA: Insufficient documentation

## 2016-06-13 DIAGNOSIS — F250 Schizoaffective disorder, bipolar type: Secondary | ICD-10-CM | POA: Diagnosis present

## 2016-06-13 DIAGNOSIS — F918 Other conduct disorders: Secondary | ICD-10-CM | POA: Diagnosis present

## 2016-06-13 LAB — CBC
HEMATOCRIT: 46.4 % (ref 39.0–52.0)
Hemoglobin: 16.2 g/dL (ref 13.0–17.0)
MCH: 33.5 pg (ref 26.0–34.0)
MCHC: 34.9 g/dL (ref 30.0–36.0)
MCV: 95.9 fL (ref 78.0–100.0)
Platelets: 172 10*3/uL (ref 150–400)
RBC: 4.84 MIL/uL (ref 4.22–5.81)
RDW: 13.4 % (ref 11.5–15.5)
WBC: 7.9 10*3/uL (ref 4.0–10.5)

## 2016-06-13 LAB — COMPREHENSIVE METABOLIC PANEL
ALK PHOS: 77 U/L (ref 38–126)
ALT: 21 U/L (ref 17–63)
ANION GAP: 9 (ref 5–15)
AST: 30 U/L (ref 15–41)
Albumin: 4.7 g/dL (ref 3.5–5.0)
BILIRUBIN TOTAL: 0.7 mg/dL (ref 0.3–1.2)
BUN: 16 mg/dL (ref 6–20)
CALCIUM: 9.2 mg/dL (ref 8.9–10.3)
CO2: 25 mmol/L (ref 22–32)
CREATININE: 1.21 mg/dL (ref 0.61–1.24)
Chloride: 105 mmol/L (ref 101–111)
GFR calc non Af Amer: >60 mL/min (ref >60)
Glucose, Bld: 94 mg/dL (ref 65–99)
Potassium: 4 mmol/L (ref 3.5–5.1)
Sodium: 139 mmol/L (ref 135–145)
TOTAL PROTEIN: 8.4 g/dL — AB (ref 6.5–8.1)

## 2016-06-13 LAB — ACETAMINOPHEN LEVEL

## 2016-06-13 LAB — SALICYLATE LEVEL: SALICYLATE LVL: 8.8 mg/dL (ref 2.8–30.0)

## 2016-06-13 LAB — ETHANOL: Alcohol, Ethyl (B): <5 mg/dL (ref <5)

## 2016-06-13 MED ORDER — QUETIAPINE FUMARATE ER 400 MG PO TB24
800.0000 mg | ORAL_TABLET | Freq: Every day | ORAL | Status: DC
  Administered 2016-06-13: 800 mg via ORAL
  Filled 2016-06-13: qty 2

## 2016-06-13 MED ORDER — ZIPRASIDONE MESYLATE 20 MG IM SOLR
20.0000 mg | Freq: Once | INTRAMUSCULAR | Status: AC
  Administered 2016-06-13: 20 mg via INTRAMUSCULAR

## 2016-06-13 MED ORDER — TRAZODONE HCL 100 MG PO TABS: 100.0000 mg | ORAL_TABLET | Freq: Every evening | ORAL | Status: DC | PRN

## 2016-06-13 MED ORDER — ZIPRASIDONE MESYLATE 20 MG IM SOLR
INTRAMUSCULAR | Status: AC
  Filled 2016-06-13: qty 20

## 2016-06-13 MED ORDER — ZIPRASIDONE MESYLATE 20 MG IM SOLR: 20.0000 mg | Freq: Four times a day (QID) | INTRAMUSCULAR | Status: DC | PRN

## 2016-06-13 MED ORDER — STERILE WATER FOR INJECTION IJ SOLN
INTRAMUSCULAR | Status: AC
  Filled 2016-06-13: qty 10

## 2016-06-13 NOTE — ED Notes (Signed)
[facility] and went to bed and to sleep. Prior to falling asleep he was calm and cooperative.

## 2016-06-13 NOTE — ED Notes (Signed)
Patient noted sleeping in room. No complaints, stable, in no acute distress. Q15 minute rounds and monitoring via Security Cameras to continue.  

## 2016-06-13 NOTE — ED Notes (Signed)
Bed: [facility] Expected date:  Expected time:  Means of arrival:  Comments: 

## 2016-06-13 NOTE — Progress Notes (Signed)
[facility]  Pt has a pcp [facility] [place] Pt has Medicaid [place] access  Pt is ambulatory with need of home health Pt may be offered resources if needed during progression meeting  No ED CP for medical issues, No BH CP

## 2016-06-13 NOTE — ED Notes (Signed)
Pt. Made aware of need for urine sample.  

## 2016-06-13 NOTE — ED Notes (Signed)
Report to include situation, background, assessment and recommendations from Diane RN. Patient sleeping, respirations regular and unlabored. Q15 minute rounds and security camera observation to continue.   

## 2016-06-13 NOTE — ED Provider Notes (Signed)
[facility]Y DEPT Provider Note   CSN: [id number] Arrival date & time: 06/13/16  1459     History   Chief Complaint Chief Complaint  Patient presents with  . [name]  . Medical Clearance    HPI Ryan York is a 45 y.o. male.  The history is provided by the police and the patient.  Mental Health Problem  Presenting symptoms: aggressive behavior, agitation, bizarre behavior, delusional, disorganized speech, disorganized thought process and paranoid behavior   Patient accompanied by:  Law enforcement Degree of incapacity (severity):  Severe Onset quality:  Gradual Timing:  Constant Progression:  Worsening Chronicity:  Recurrent Treatment compliance:  Unable to specify Relieved by:  Nothing Worsened by:  Nothing Ineffective treatments:  None tried Associated symptoms: distractible and irritability   Risk factors: hx of mental illness     Past Medical History:  Diagnosis Date  . Asthma   . Bipolar disorder (HCC)   . Hypertension   . Schizophrenia [facility])     Patient Active Problem List   Diagnosis Date Noted  . Tobacco use disorder 04/04/2016  . Schizoaffective disorder, bipolar type (HCC) 04/20/2012    Past Surgical History:  Procedure Laterality Date  . KNEE SURGERY         Home Medications    Prior to Admission medications   Medication Sig Start Date End Date Taking? Authorizing Provider  montelukast (SINGULAIR) 10 MG tablet Take 10 mg by mouth daily as needed. For allergies. 01/07/16   Historical Provider, MD  QUEtiapine (SEROQUEL XR) 400 MG 24 hr tablet Take 2 tablets (800 mg total) by mouth at bedtime. 04/10/16   Shari Prows, MD  traZODone (DESYREL) 100 MG tablet Take 1 tablet (100 mg total) by mouth at bedtime as needed for sleep. Patient not taking: Reported on 04/27/2016 04/10/16   Shari Prows, MD    Family History Family History  Problem Relation Age of Onset  . Family history unknown: Yes    Social History Social History   Substance Use Topics  . Smoking status: Current Some Day Smoker    Packs/day: 0.50    Years: 10.00    Types: Cigars  . Smokeless tobacco: Never Used  . Alcohol use Yes     Comment: Pt stated that he rarely consumes alcohol     Allergies   Vistaril [hydroxyzine]   Review of Systems Review of Systems  Constitutional: Positive for irritability.  Psychiatric/Behavioral: Positive for agitation and paranoia.  All other systems reviewed and are negative.    Physical Exam Updated Vital Signs BP 127/98 (BP Location: Left Arm)   Pulse 87   Temp 97.7 F (36.5 C) (Oral)   Resp 16   SpO2 97%   Physical Exam  Constitutional: He is oriented to person, place, and time. He appears well-developed and well-nourished. No distress.  HENT:  Head: Normocephalic and atraumatic.  Eyes: Conjunctivae are normal.  Neck: Neck supple. No tracheal deviation present.  Cardiovascular: Normal rate and regular rhythm.   Pulmonary/Chest: Effort normal. No respiratory distress.  Abdominal: Soft. He exhibits no distension.  Neurological: He is alert and oriented to person, place, and time.  Skin: Skin is warm and dry.  Psychiatric: His mood appears anxious. His affect is angry and inappropriate. His speech is rapid and/or pressured and tangential. He is agitated, aggressive and hyperactive. Thought content is paranoid and delusional. He expresses impulsivity and inappropriate judgment. He is inattentive.     ED Treatments / Results  Labs (all  labs ordered are listed, but only abnormal results are displayed) Labs Reviewed  COMPREHENSIVE METABOLIC PANEL - Abnormal; Notable for the following:       Result Value   Total Protein 8.4 (*)    All other components within normal limits  ACETAMINOPHEN LEVEL - Abnormal; Notable for the following:    Acetaminophen (Tylenol), Serum <10 (*)    All other components within normal limits  ETHANOL  SALICYLATE LEVEL  CBC  URINE RAPID DRUG SCREEN, HOSP PERFORMED     EKG  EKG Interpretation None       Radiology No results found.  Procedures Procedures (including critical care time)  Medications Ordered in ED Medications  ziprasidone (GEODON) 20 MG injection (not administered)  sterile water (preservative free) injection (not administered)  QUEtiapine (SEROQUEL XR) 24 hr tablet 800 mg (not administered)  traZODone (DESYREL) tablet 100 mg (not administered)  ziprasidone (GEODON) injection 20 mg (not administered)  ziprasidone (GEODON) injection 20 mg (20 mg Intramuscular Given by Other 06/13/16 1530)     Initial Impression / Assessment and Plan / ED Course  I have reviewed the triage vital signs and the nursing notes.  Pertinent labs & imaging results that were available during my care of the patient were reviewed by me and considered in my medical decision making (see chart for details).  Clinical Course   45 y.o. male presents with [place] psychosis. He has been in and out of his parents house and yelling at people in the street, threatening to rape old women, and being very aggressive even when arrested by police under involuntary commitment filed by his family with the magistrate. On arrival here he is elevated with tangential thinking and screaming about spitting in people's faces and having paranoid thoughts regarding people's trustworthiness based on eye color. Geodon given for acute agitation, TTS consulted to help with stabilization and placement. MEDICALLY CLEAR FOR TRANSFER OR PSYCHIATRIC ADMISSION.   Final Clinical Impressions(s) / ED Diagnoses   Final diagnoses:  Psychosis, unspecified psychosis type    New Prescriptions New Prescriptions   No medications on file     Lyndal Pulleyaniel Chrles Selley, MD 06/13/16 [phone]

## 2016-06-13 NOTE — ED Triage Notes (Signed)
Patient has [name] papers and was brought in by [employer]. [name] papers state that the person is threatening damage to a house, spit on other person's car, and stated he was going to have sex with an 43 year old woman., threatened harm to his family. Patient was yelling, screaming that he hated white people and that he would spit on all white people. Patient was brought in with handcuffs on.

## 2016-06-13 NOTE — BH Assessment (Addendum)
Assessment Note  Ryan York is an 45 y.o. male, who was brought to [facility] by [employer].  Patient was [name]'d because of verbal threats towards family.  Patient presented orientated x3, mood "sad and mad", affect blunted and irritable.  Patient denied current SI, HI, and AVH.  Patient started to masturbate during the assessment and was told to stop, he compiled.  Patient reports "flipping out" after having argument with Mother.  He reports daily arguments with his Mother and reports wanting his own independent housing.  Patient denied any substance use including alcohol.  Patient reports attending community college.  Patient has been hospitalized at [facility] in 04-04-2016, 04-19-2012, 2012, 2010 all for Schizoaffective Disorder.  Patient receives outpatient treatment at [facility] and may have an ACTT.   Patient is to be reevaluated in the morning.    Diagnosis:  Schizoaffective Disorder, Bipolar type  Past Medical History:  Past Medical History:  Diagnosis Date  . Asthma   . Bipolar disorder (HCC)   . Hypertension   . Schizophrenia [facility])     Past Surgical History:  Procedure Laterality Date  . KNEE SURGERY      Family History:  Family History  Problem Relation Age of Onset  . Family history unknown: Yes    Social History:  reports that he has been smoking Cigars.  He has a 5.00 pack-year smoking history. He has never used smokeless tobacco. He reports that he drinks alcohol. He reports that he does not use drugs.  Additional Social History:     CIWA: CIWA-Ar BP: 127/98 Pulse Rate: 87 COWS:    Allergies:  Allergies  Allergen Reactions  . Vistaril [Hydroxyzine] Nausea Only    Severe stomach pain    Home Medications:  (Not in a hospital admission)  OB/GYN Status:  No LMP for male patient.  General Assessment Data Location of Assessment: [facility] ED TTS Assessment: In system Is this a Tele or Face-to-Face Assessment?: Face-to-Face Is this an Initial Assessment or a Re-assessment for  this encounter?: Initial Assessment Marital status: Single Maiden name: N/A Is patient pregnant?: No Pregnancy Status: No Living Arrangements: Parent (lives with Mother) Can pt return to current living arrangement?: Yes Admission Status: Involuntary Is patient capable of signing voluntary admission?: No Referral Source: Other ([employer]) Insurance type: Medicaid, [place]  Medical Screening Exam [facility] Walk-in ONLY) Medical Exam completed: Yes  Crisis Care Plan Living Arrangements: Parent (lives with Mother) Legal Guardian: Other: (Self) Name of Psychiatrist: [facility] Name of Therapist: [facility]  Education Status Is patient currently in school?: No Current Grade: N/A Highest grade of school patient has completed: 46 Name of school: [employer] person: NA  Risk to self with the past 6 months Suicidal Ideation: No Has patient been a risk to self within the past 6 months prior to admission? : No Suicidal Intent: No Has patient had any suicidal intent within the past 6 months prior to admission? : No Is patient at risk for suicide?: No Suicidal Plan?: No Has patient had any suicidal plan within the past 6 months prior to admission? : No Access to Means: No What has been your use of drugs/alcohol within the last 12 months?: Patient denied substance use Previous Attempts/Gestures: No How many times?: 0 Other Self Harm Risks: None Triggers for Past Attempts: None known Intentional Self Injurious Behavior: None Family Suicide History: No Recent stressful life event(s): Conflict (Comment) (argues constantly with Mother, wants own housing) Persecutory voices/beliefs?: No Depression: No Depression Symptoms:  Feeling angry/irritable, Feeling worthless/self pity Substance abuse history and/or treatment for substance abuse?: No (Patient denied) Suicide prevention information given to non-admitted patients: Not applicable  Risk to Others within the past 6 months Homicidal  Ideation: No Does patient have any lifetime risk of violence toward others beyond the six months prior to admission? : No Thoughts of Harm to Others: No Comment - Thoughts of Harm to Others: Patient denied Current Homicidal Intent: No Current Homicidal Plan: No Access to Homicidal Means: No Identified Victim: N/A History of harm to others?: No Assessment of Violence: None Noted (Patient is on Q15, brought by [employer] in handcuffs) Violent Behavior Description: Patient was verbally agressive with Mother prior to Police arriving at the home Does patient have access to weapons?: No (Patient denied) Criminal Charges Pending?: No (Patient denied) Does patient have a court date: No (Patient denied) Is patient on probation?: No (Patient denied)  Psychosis Hallucinations: None noted Delusions: None noted  Mental Status Report Appearance/Hygiene: In hospital gown Eye Contact: Fair Motor Activity: Mannerisms (Patient was masturbating during assessment, told to stop and) Speech: Logical/coherent, Tangential Level of Consciousness: Drowsy Mood: Depressed Affect: Depressed, Irritable Anxiety Level: Minimal Thought Processes: Coherent, Circumstantial Judgement: Impaired Orientation: Person, Place, Time Obsessive Compulsive Thoughts/Behaviors: None  Cognitive Functioning Concentration: Decreased Memory: Recent Intact, Remote Intact IQ: Average Insight: Poor Impulse Control: Poor Appetite: Fair Weight Loss: 0 Weight Gain: 0 Sleep: No Change Total Hours of Sleep: 6 Vegetative Symptoms: None  ADLScreening [facility] Assessment Services) Patient's cognitive ability adequate to safely complete daily activities?: Yes Patient able to express need for assistance with ADLs?: Yes Independently performs ADLs?: No  Prior Inpatient Therapy Prior Inpatient Therapy: Yes Prior Therapy Dates: 2010,2012,2013,2017 Prior Therapy Facilty/Provider(s): [facility], [facility]  Reason for Treatment: Schizoaffective  Disorder  Prior Outpatient Therapy Prior Outpatient Therapy: Yes Prior Therapy Dates: Present Prior Therapy Facilty/Provider(s): [facility] Reason for Treatment: Schizoaffective Disorder Does patient have an ACCT team?: Yes Does patient have Intensive In-House Services?  : No Does patient have [facility] services? : No Does patient have P4CC services?: No  ADL Screening (condition at time of admission) Patient's cognitive ability adequate to safely complete daily activities?: Yes Is the patient deaf or have difficulty hearing?: No Does the patient have difficulty seeing, even when wearing glasses/contacts?: No Does the patient have difficulty concentrating, remembering, or making decisions?: No Patient able to express need for assistance with ADLs?: Yes Independently performs ADLs?: No Communication: Independent Dressing (OT): Independent Grooming: Independent Feeding: Independent Bathing: Independent Toileting: Independent In/Out Bed: Independent Walks in Home: Independent Does the patient have difficulty walking or climbing stairs?: No Weakness of Legs: None Weakness of Arms/Hands: None  Home Assistive Devices/Equipment Home Assistive Devices/Equipment: None    Abuse/Neglect Assessment (Assessment to be complete while patient is alone) Physical Abuse: Denies Verbal Abuse: Denies Sexual Abuse: Denies Exploitation of patient/patient's resources: Denies Self-Neglect: Denies Values / Beliefs Cultural Requests During Hospitalization: None Spiritual Requests During Hospitalization: None   Advance Directives (For Healthcare) Does patient have an advance directive?: No    Additional Information 1:1 In Past 12 Months?: Yes CIRT Risk: No Elopement Risk: No Does patient have medical clearance?: Yes     Disposition:  Disposition Initial Assessment Completed for this Encounter: Yes Disposition of Patient: Other dispositions (Awaiting review with Extender) Type of  inpatient treatment program: Adult Type of outpatient treatment: Adult Other disposition(s): Other (Comment) (Awaiting review with Extender)  On Site Evaluation by:   Reviewed with Physician:    Dey-Johnson,Kyilee Gregg 06/13/2016  7:55 PM

## 2016-06-14 ENCOUNTER — Encounter (HOSPITAL_COMMUNITY): Payer: Self-pay | Admitting: Registered Nurse

## 2016-06-14 DIAGNOSIS — F6089 Other specific personality disorders: Secondary | ICD-10-CM

## 2016-06-14 DIAGNOSIS — F17210 Nicotine dependence, cigarettes, uncomplicated: Secondary | ICD-10-CM | POA: Diagnosis not present

## 2016-06-14 DIAGNOSIS — F250 Schizoaffective disorder, bipolar type: Secondary | ICD-10-CM | POA: Diagnosis not present

## 2016-06-14 NOTE — ED Notes (Signed)
Patient noted sleeping in room. No complaints, stable, in no acute distress. Q15 minute rounds and monitoring via Security Cameras to continue.  

## 2016-06-14 NOTE — ED Notes (Addendum)
Written dc instructions reviewed w/ patient.  Pt encouraged to take medications as directed, and follow up with his OP provider as soon as possible.  Pt also encouraged to seek treatment for return of suicidal/homicidal thoughts or urges or AVH.  Pt verbalized understanding.  Pt ambulatory to dc area with mHt, belongings returned after leaving the area.

## 2016-06-14 NOTE — ED Notes (Signed)
Up to the bathroom 

## 2016-06-14 NOTE — ED Notes (Signed)
In the day room watching tv 

## 2016-06-14 NOTE — [facility] Suicide Risk Assessment (Cosign Needed)
Suicide Risk Assessment  Discharge [facility]   [facility]   Principal Problem: Schizoaffective disorder, bipolar type [facility](HCC) Discharge Diagnoses:  Patient Active Problem List   Diagnosis Date Noted  . Aggressive behavior [F60.89] 06/14/2016  . Tobacco use disorder [F17.200] 04/04/2016  . Schizoaffective disorder, bipolar type (HCC) [F25.0] 04/20/2012    Total Time spent with patient: 45 minutes Musculoskeletal: Strength & Muscle Tone: within normal limits Gait & Station: normal Patient leans: N/A  Psychiatric Specialty Exam: Physical Exam  Nursing note and vitals reviewed. Constitutional: He is oriented to person, place, and time.  Neck: Normal range of motion.  Respiratory: Effort normal.  Musculoskeletal: Normal range of motion.  Neurological: He is alert and oriented to person, place, and time.  Skin: Skin is warm and dry.  Psychiatric: His speech is normal. He is agitated. Thought content is not paranoid and not delusional. Cognition and memory are normal. He expresses impulsivity. He expresses no homicidal and no suicidal ideation.    Review of Systems  Psychiatric/Behavioral: Positive for substance abuse. Negative for depression (Denies), hallucinations (Denies) and suicidal ideas (Denies). The patient is not nervous/anxious (Denies).   All other systems reviewed and are negative.   Blood pressure 112/76, pulse 68, temperature 97.9 F (36.6 C), temperature source Oral, resp. rate 18, SpO2 100 %.There is no height or weight on file to calculate BMI.  General Appearance: Disheveled  Eye Contact:  Good  Speech:  Clear and Coherent  Volume:  Normal  Mood:  Angry  Affect:  Congruent  Thought Process:  Irrelevant  Orientation:  Full (Time, Place, and Person)  Thought Content:  Denies hallucinations, delusions, and paranoia  Suicidal Thoughts:  No  Homicidal Thoughts:  No  Memory:  Immediate;   Good Recent;   Good Remote;   Good   Judgement:  Fair  Insight:  Fair  Psychomotor Activity:  Normal  Concentration:  Concentration: Fair and Attention Span: Fair  Recall:  Good  Fund of Knowledge:  Fair  Language:  Good  Akathisia:  No  Handed:  Right  AIMS (if indicated):     Assets:  Leisure Time Physical Health Social Support  ADL's:  Intact  Cognition:  WNL  Sleep:        Mental Status Per Nursing Assessment::   On Admission:    Agitated behavior   Demographic Factors:  Male  Loss Factors: NA  Historical Factors: NA  Risk Reduction Factors:   Sense of responsibility to family, Positive social support and Positive therapeutic relationship  Continued Clinical Symptoms:  Angry  Cognitive Features That Contribute To Risk:  None    Suicide Risk:  Minimal: No identifiable suicidal ideation.  Patients presenting with no risk factors but with morbid ruminations; may be classified as minimal risk based on the severity of the depressive symptoms    Plan Of Care/Follow-up recommendations:  Activity:  As tolerated Diet:  As tolerated  Rawley Harju, NP 06/14/2016, 12:02 PM

## 2016-06-14 NOTE — ED Notes (Addendum)
In the bathroom to bath, [name] has been rescinded per CSW, follow up information given to pt by CSW

## 2016-06-14 NOTE — Consult Note (Signed)
[facility] Consult   Reason for Consult:  Aggressive Behavior Referring Physician:  EDP Patient Identification: Ryan York MRN:  [id number] Principal Diagnosis: Aggressive behavior Diagnosis:   Patient Active Problem List   Diagnosis Date Noted  . Aggressive behavior [F60.89] 06/14/2016  . Tobacco use disorder [F17.200] 04/04/2016  . Schizoaffective disorder, bipolar type ([place]) [F25.0] 04/20/2012    Total Time spent with patient: 45 minutes  Subjective:   Ryan York is a 45 y.o. male patient was brought to [facility] by [employer] under [name] stating patient threatening to damage a house, spit on another persons car, and stated that he was going to have sex with an 43 yr old woman .  HPI:  Ryan York 45 y.o. male patient seen by Dr. Darleene Cleaver and this provider.  Chart reviewed 06/14/16.  On evaluation:  Ryan York sitting on his bed; he reports that he is does not know why he is here.  "They recommended that I come here."  Patient states that he lives with his parents who are getting old and having some difficulty but would not elaborate "I don't want to discuss there personal problem."  Patient denies that he has suicidal/homicidal ideation.  Also denies psychosis, and paranoia.  Patient does not appear to be responding to internal or external stimuli.  Patient has very short answers and getting agitated when asked questions.      Past Psychiatric History: schizoaffective disorder  Risk to Self: Suicidal Ideation: No Suicidal Intent: No Is patient at risk for suicide?: No Suicidal Plan?: No Access to Means: No What has been your use of drugs/alcohol within the last 12 months?: Patient denied substance use How many times?: 0 Other Self Harm Risks: None Triggers for Past Attempts: None known Intentional Self Injurious Behavior: None Risk to Others: Homicidal Ideation: No Thoughts of Harm to Others: No Comment - Thoughts of Harm to Others: Patient  denied Current Homicidal Intent: No Current Homicidal Plan: No Access to Homicidal Means: No Identified Victim: N/A History of harm to others?: No Assessment of Violence: None Noted (Patient is on Q15, brought by [employer] in handcuffs) Violent Behavior Description: Patient was verbally agressive with Mother prior to Police arriving at the home Does patient have access to weapons?: No (Patient denied) Criminal Charges Pending?: No (Patient denied) Does patient have a court date: No (Patient denied) Prior Inpatient Therapy: Prior Inpatient Therapy: Yes Prior Therapy Dates: 2010,2012,2013,2017 Prior Therapy Facilty/Provider(s): [place], [facility]  Reason for Treatment: Schizoaffective Disorder Prior Outpatient Therapy: Prior Outpatient Therapy: Yes Prior Therapy Dates: Present Prior Therapy Facilty/Provider(s): [facility] Reason for Treatment: Schizoaffective Disorder Does patient have an ACCT team?: Yes Does patient have Intensive In-House Services?  : No Does patient have [facility] services? : No Does patient have P4CC services?: No  Past Medical History:  Past Medical History:  Diagnosis Date  . Asthma   . Bipolar disorder ([place])   . Hypertension   . Schizophrenia [facility])     Past Surgical History:  Procedure Laterality Date  . KNEE SURGERY     Family History:  Family History  Problem Relation Age of Onset  . Family history unknown: Yes   Family Psychiatric  History: No pertinent family history Social History:  History  Alcohol Use  . Yes    Comment: Pt stated that he rarely consumes alcohol     History  Drug Use No    Comment: Pt denies     Social History   Social  History  . Marital status: Single    Spouse name: N/A  . Number of children: N/A  . Years of education: N/A   Social History Main Topics  . Smoking status: Current Some Day Smoker    Packs/day: 0.50    Years: 10.00    Types: Cigars  . Smokeless tobacco: Never Used  . Alcohol use Yes     Comment: Pt stated  that he rarely consumes alcohol  . Drug use: No     Comment: Pt denies   . Sexual activity: Not Asked   Other Topics Concern  . None   Social History Narrative  . None   Additional Social History:    Allergies:   Allergies  Allergen Reactions  . Vistaril [Hydroxyzine] Nausea Only    Severe stomach pain    Labs:  Results for orders placed or performed during the hospital encounter of 06/13/16 (from the past 48 hour(s))  Comprehensive metabolic panel     Status: Abnormal   Collection Time: 06/13/16  3:26 PM  Result Value Ref Range   Sodium 139 135 - 145 mmol/L   Potassium 4.0 3.5 - 5.1 mmol/L   Chloride 105 101 - 111 mmol/L   CO2 25 22 - 32 mmol/L   Glucose, Bld 94 65 - 99 mg/dL   BUN 16 6 - 20 mg/dL   Creatinine, Ser 1.21 0.61 - 1.24 mg/dL   Calcium 9.2 8.9 - 10.3 mg/dL   Total Protein 8.4 (H) 6.5 - 8.1 g/dL   Albumin 4.7 3.5 - 5.0 g/dL   AST 30 15 - 41 U/L   ALT 21 17 - 63 U/L   Alkaline Phosphatase 77 38 - 126 U/L   Total Bilirubin 0.7 0.3 - 1.2 mg/dL   GFR calc non Af Amer >60 >60 mL/min   GFR calc Af Amer >60 >60 mL/min    Comment: (NOTE) The eGFR has been calculated using the CKD EPI equation. This calculation has not been validated in all clinical situations. eGFR's persistently <60 mL/min signify possible Chronic Kidney Disease.    Anion gap 9 5 - 15  Ethanol     Status: None   Collection Time: 06/13/16  3:26 PM  Result Value Ref Range   Alcohol, Ethyl (B) <5 <5 mg/dL    Comment:        LOWEST DETECTABLE LIMIT FOR SERUM ALCOHOL IS 5 mg/dL FOR MEDICAL PURPOSES ONLY   Salicylate level     Status: None   Collection Time: 06/13/16  3:26 PM  Result Value Ref Range   Salicylate Lvl 8.8 2.8 - 30.0 mg/dL  Acetaminophen level     Status: Abnormal   Collection Time: 06/13/16  3:26 PM  Result Value Ref Range   Acetaminophen (Tylenol), Serum <10 (L) 10 - 30 ug/mL    Comment:        THERAPEUTIC CONCENTRATIONS VARY SIGNIFICANTLY. A RANGE OF 10-30 ug/mL MAY  BE AN EFFECTIVE CONCENTRATION FOR MANY PATIENTS. HOWEVER, SOME ARE BEST TREATED AT CONCENTRATIONS OUTSIDE THIS RANGE. ACETAMINOPHEN CONCENTRATIONS >150 ug/mL AT 4 HOURS AFTER INGESTION AND >50 ug/mL AT 12 HOURS AFTER INGESTION ARE OFTEN ASSOCIATED WITH TOXIC REACTIONS.   cbc     Status: None   Collection Time: 06/13/16  3:26 PM  Result Value Ref Range   WBC 7.9 4.0 - 10.5 K/uL   RBC 4.84 4.22 - 5.81 MIL/uL   Hemoglobin 16.2 13.0 - 17.0 g/dL   HCT 46.4 39.0 - 52.0 %  MCV 95.9 78.0 - 100.0 fL   MCH 33.5 26.0 - 34.0 pg   MCHC 34.9 30.0 - 36.0 g/dL   RDW 13.4 11.5 - 15.5 %   Platelets 172 150 - 400 K/uL    Current Facility-Administered Medications  Medication Dose Route Frequency Provider Last Rate Last Dose  . QUEtiapine (SEROQUEL XR) 24 hr tablet 800 mg  800 mg Oral QHS Leo Grosser, MD   800 mg at 06/13/16 2117  . traZODone (DESYREL) tablet 100 mg  100 mg Oral QHS PRN Leo Grosser, MD      . ziprasidone (GEODON) injection 20 mg  20 mg Intramuscular Q6H PRN Leo Grosser, MD       Current Outpatient Prescriptions  Medication Sig Dispense Refill  . albuterol (PROVENTIL HFA;VENTOLIN HFA) 108 (90 Base) MCG/ACT inhaler Inhale 1-2 puffs into the lungs every 6 (six) hours as needed for wheezing or shortness of breath.    . cetirizine (ZYRTEC) 10 MG tablet Take 10 mg by mouth daily as needed for allergies.    . montelukast (SINGULAIR) 10 MG tablet Take 10 mg by mouth daily as needed. For allergies.  0  . VOLTAREN 1 % GEL Apply 2 g topically 4 (four) times daily as needed for pain.  0  . QUEtiapine (SEROQUEL XR) 400 MG 24 hr tablet Take 2 tablets (800 mg total) by mouth at bedtime. 60 tablet 1    Musculoskeletal: Strength & Muscle Tone: within normal limits Gait & Station: normal Patient leans: N/A  Psychiatric Specialty Exam: Physical Exam  Nursing note and vitals reviewed. Constitutional: He is oriented to person, place, and time.  Neck: Normal range of motion.   Respiratory: Effort normal.  Musculoskeletal: Normal range of motion.  Neurological: He is alert and oriented to person, place, and time.  Skin: Skin is warm and dry.  Psychiatric: His speech is normal. He is agitated. Thought content is not paranoid and not delusional. Cognition and memory are normal. He expresses impulsivity. He expresses no homicidal and no suicidal ideation.    Review of Systems  Psychiatric/Behavioral: Positive for substance abuse. Negative for depression (Denies), hallucinations (Denies) and suicidal ideas (Denies). The patient is not nervous/anxious (Denies).   All other systems reviewed and are negative.   Blood pressure 112/76, pulse 68, temperature 97.9 F (36.6 C), temperature source Oral, resp. rate 18, SpO2 100 %.There is no height or weight on file to calculate BMI.  General Appearance: Disheveled  Eye Contact:  Good  Speech:  Clear and Coherent  Volume:  Normal  Mood:  Angry  Affect:  Congruent  Thought Process:  Irrelevant  Orientation:  Full (Time, Place, and Person)  Thought Content:  Denies hallucinations, delusions, and paranoia  Suicidal Thoughts:  No  Homicidal Thoughts:  No  Memory:  Immediate;   Good Recent;   Good Remote;   Good  Judgement:  Fair  Insight:  Fair  Psychomotor Activity:  Normal  Concentration:  Concentration: Fair and Attention Span: Fair  Recall:  Good  Fund of Knowledge:  Fair  Language:  Good  Akathisia:  No  Handed:  Right  AIMS (if indicated):     Assets:  Leisure Time Physical Health Social Support  ADL's:  Intact  Cognition:  WNL  Sleep:        Treatment Plan Summary: Plan Discharge home with outpatietn resources  Disposition: No evidence of imminent risk to self or others at present.   Patient does not meet criteria for psychiatric inpatient  admission.  Ryan Newport, NP 06/14/2016 11:27 AM  Patient seen face-to-face for psychiatric evaluation, chart reviewed and case discussed with the physician  extender and developed treatment plan. Reviewed the information documented and agree with the treatment plan. Corena Pilgrim, MD

## 2016-06-16 ENCOUNTER — Emergency Department (HOSPITAL_COMMUNITY)
Admission: EM | Admit: 2016-06-16 | Discharge: 2016-06-16 | Disposition: A | Discharge status: Home / Self Care | Payer: Medicaid Other | Attending: Emergency Medicine | Admitting: Emergency Medicine

## 2016-06-16 ENCOUNTER — Encounter (HOSPITAL_COMMUNITY): Payer: Self-pay

## 2016-06-16 DIAGNOSIS — F17290 Nicotine dependence, other tobacco product, uncomplicated: Secondary | ICD-10-CM | POA: Diagnosis not present

## 2016-06-16 DIAGNOSIS — J45909 Unspecified asthma, uncomplicated: Secondary | ICD-10-CM | POA: Diagnosis not present

## 2016-06-16 DIAGNOSIS — I10 Essential (primary) hypertension: Secondary | ICD-10-CM | POA: Diagnosis not present

## 2016-06-16 DIAGNOSIS — F250 Schizoaffective disorder, bipolar type: Secondary | ICD-10-CM | POA: Diagnosis present

## 2016-06-16 DIAGNOSIS — Z79899 Other long term (current) drug therapy: Secondary | ICD-10-CM | POA: Diagnosis not present

## 2016-06-16 DIAGNOSIS — R440 Auditory hallucinations: Secondary | ICD-10-CM | POA: Diagnosis present

## 2016-06-16 LAB — CBC WITH DIFFERENTIAL/PLATELET
Basophils Absolute: 0 10*3/uL (ref 0.0–0.1)
Basophils Relative: 0 %
EOS ABS: 0.1 10*3/uL (ref 0.0–0.7)
EOS PCT: 1 %
HCT: 45.4 % (ref 39.0–52.0)
HEMOGLOBIN: 15.7 g/dL (ref 13.0–17.0)
LYMPHS ABS: 2.5 10*3/uL (ref 0.7–4.0)
LYMPHS PCT: 30 %
MCH: 33.5 pg (ref 26.0–34.0)
MCHC: 34.6 g/dL (ref 30.0–36.0)
MCV: 96.8 fL (ref 78.0–100.0)
MONOS PCT: 6 %
Monocytes Absolute: 0.5 10*3/uL (ref 0.1–1.0)
NEUTROS PCT: 63 %
Neutro Abs: 5 10*3/uL (ref 1.7–7.7)
Platelets: 161 10*3/uL (ref 150–400)
RBC: 4.69 MIL/uL (ref 4.22–5.81)
RDW: 13.6 % (ref 11.5–15.5)
WBC: 8.1 10*3/uL (ref 4.0–10.5)

## 2016-06-16 LAB — COMPREHENSIVE METABOLIC PANEL
ALT: 24 U/L (ref 17–63)
AST: 30 U/L (ref 15–41)
Albumin: 4.6 g/dL (ref 3.5–5.0)
Alkaline Phosphatase: 66 U/L (ref 38–126)
Anion gap: 9 (ref 5–15)
BUN: 23 mg/dL — ABNORMAL HIGH (ref 6–20)
CO2: 24 mmol/L (ref 22–32)
Calcium: 9.1 mg/dL (ref 8.9–10.3)
Chloride: 107 mmol/L (ref 101–111)
Creatinine, Ser: 1.15 mg/dL (ref 0.61–1.24)
GFR calc Af Amer: >60 mL/min (ref >60)
GFR calc non Af Amer: >60 mL/min (ref >60)
Glucose, Bld: 133 mg/dL — ABNORMAL HIGH (ref 65–99)
Potassium: 4.1 mmol/L (ref 3.5–5.1)
Sodium: 140 mmol/L (ref 135–145)
Total Bilirubin: 0.7 mg/dL (ref 0.3–1.2)
Total Protein: 7.8 g/dL (ref 6.5–8.1)

## 2016-06-16 LAB — ACETAMINOPHEN LEVEL: Acetaminophen (Tylenol), Serum: <10 ug/mL — ABNORMAL LOW (ref 10–30)

## 2016-06-16 LAB — RAPID URINE DRUG SCREEN, HOSP PERFORMED
Amphetamines: NOT DETECTED
Barbiturates: NOT DETECTED
Benzodiazepines: NOT DETECTED
Cocaine: NOT DETECTED
Opiates: NOT DETECTED
Tetrahydrocannabinol: NOT DETECTED

## 2016-06-16 LAB — SALICYLATE LEVEL: Salicylate Lvl: 12.2 mg/dL (ref 2.8–30.0)

## 2016-06-16 LAB — ETHANOL: Alcohol, Ethyl (B): <5 mg/dL (ref <5)

## 2016-06-16 MED ORDER — ACETAMINOPHEN 325 MG PO TABS: 650.0000 mg | ORAL_TABLET | ORAL | Status: DC | PRN

## 2016-06-16 MED ORDER — NICOTINE 21 MG/24HR TD PT24: 21.0000 mg | MEDICATED_PATCH | Freq: Every day | TRANSDERMAL | Status: DC

## 2016-06-16 MED ORDER — IBUPROFEN 200 MG PO TABS: 600.0000 mg | ORAL_TABLET | Freq: Three times a day (TID) | ORAL | Status: DC | PRN

## 2016-06-16 MED ORDER — ONDANSETRON HCL 4 MG PO TABS: 4.0000 mg | ORAL_TABLET | Freq: Three times a day (TID) | ORAL | Status: DC | PRN

## 2016-06-16 MED ORDER — ZOLPIDEM TARTRATE 10 MG PO TABS: 10.0000 mg | ORAL_TABLET | Freq: Every evening | ORAL | Status: DC | PRN

## 2016-06-16 MED ORDER — ALUM & MAG HYDROXIDE-SIMETH 200-200-20 MG/5ML PO SUSP: 30.0000 mL | ORAL | Status: DC | PRN

## 2016-06-16 NOTE — ED Provider Notes (Signed)
[facility]Y DEPT Provider Note   CSN: [id number] Arrival date & time: 06/16/16  0321  Time seen 04:55 AM   History   Chief Complaint Chief Complaint  Patient presents with  . Hallucinations    HPI Ryan York is a 45 y.o. male.  HPI patient reports he is having "really bad" hallucinations. He states he sees "human forms" moving around. He states they are playing games on him. He states sometimes objects turn into humans. He initially denied auditory hallucinations and then he states he does hear voices who were trying to seduce him. He denies suicidal or homicidal ideation however he states he's scared that he is going to get hurt.   PCP Dr Concepcion ElkAvbuere  Past Medical History:  Diagnosis Date  . Asthma   . Bipolar disorder (HCC)   . Hypertension   . Schizophrenia [facility](HCC)     Patient Active Problem List   Diagnosis Date Noted  . Aggressive behavior 06/14/2016  . Tobacco use disorder 04/04/2016  . Schizoaffective disorder, bipolar type (HCC) 04/20/2012    Past Surgical History:  Procedure Laterality Date  . KNEE SURGERY         Home Medications    Prior to Admission medications   Medication Sig Start Date End Date Taking? Authorizing Provider  albuterol (PROVENTIL HFA;VENTOLIN HFA) 108 (90 Base) MCG/ACT inhaler Inhale 1-2 puffs into the lungs every 6 (six) hours as needed for wheezing or shortness of breath.    Historical Provider, MD  cetirizine (ZYRTEC) 10 MG tablet Take 10 mg by mouth daily as needed for allergies.    Historical Provider, MD  montelukast (SINGULAIR) 10 MG tablet Take 10 mg by mouth daily as needed. For allergies. 01/07/16   Historical Provider, MD  QUEtiapine (SEROQUEL XR) 400 MG 24 hr tablet Take 2 tablets (800 mg total) by mouth at bedtime. 04/10/16   Shari ProwsJolanta B Pucilowska, MD  VOLTAREN 1 % GEL Apply 2 g topically 4 (four) times daily as needed for pain. 05/13/16   Historical Provider, MD    Family History Family History  Problem Relation  Age of Onset  . Family history unknown: Yes    Social History Social History  Substance Use Topics  . Smoking status: Current Some Day Smoker    Packs/day: 0.50    Years: 10.00    Types: Cigars  . Smokeless tobacco: Never Used  . Alcohol use Yes     Comment: Pt stated that he rarely consumes alcohol  lives with parents States he is in college studying criminal justice   Allergies   Vistaril [hydroxyzine]   Review of Systems Review of Systems  All other systems reviewed and are negative.    Physical Exam Updated Vital Signs BP 116/79   Pulse 79   Temp 98 F (36.7 C) (Oral)   Resp 17   SpO2 97%   Vital signs normal    Physical Exam  Constitutional: He is oriented to person, place, and time. He appears well-developed and well-nourished.  Non-toxic appearance. He does not appear ill. No distress.  Pt seems to just be interested in eating  HENT:  Head: Normocephalic and atraumatic.  Right Ear: External ear normal.  Left Ear: External ear normal.  Nose: Nose normal. No mucosal edema or rhinorrhea.  Mouth/Throat: Oropharynx is clear and moist and mucous membranes are normal. No dental abscesses or uvula swelling.  Eyes: Conjunctivae and EOM are normal. Pupils are equal, round, and reactive to light.  Neck: Normal range  of motion and full passive range of motion without pain. Neck supple.  Cardiovascular: Normal rate, regular rhythm and normal heart sounds.  Exam reveals no gallop and no friction rub.   No murmur heard. Pulmonary/Chest: Effort normal and breath sounds normal. No respiratory distress. He has no wheezes. He has no rhonchi. He has no rales. He exhibits no tenderness and no crepitus.  Abdominal: Soft. Normal appearance and bowel sounds are normal. He exhibits no distension. There is no tenderness. There is no rebound and no guarding.  Musculoskeletal: Normal range of motion. He exhibits no edema or tenderness.  Moves all extremities well.   Neurological:  He is alert and oriented to person, place, and time. He has normal strength. No cranial nerve deficit.  Skin: Skin is warm, dry and intact. No rash noted. No erythema. No pallor.  Psychiatric: He has a normal mood and affect. His speech is normal and behavior is normal. His mood appears not anxious. Thought content is paranoid.  Nursing note and vitals reviewed.    ED Treatments / Results  Labs (all labs ordered are listed, but only abnormal results are displayed) Results for orders placed or performed during the hospital encounter of 06/16/16  Comprehensive metabolic panel  Result Value Ref Range   Sodium 140 135 - 145 mmol/L   Potassium 4.1 3.5 - 5.1 mmol/L   Chloride 107 101 - 111 mmol/L   CO2 24 22 - 32 mmol/L   Glucose, Bld 133 (H) 65 - 99 mg/dL   BUN 23 (H) 6 - 20 mg/dL   Creatinine, Ser 1.61 0.61 - 1.24 mg/dL   Calcium 9.1 8.9 - 09.6 mg/dL   Total Protein 7.8 6.5 - 8.1 g/dL   Albumin 4.6 3.5 - 5.0 g/dL   AST 30 15 - 41 U/L   ALT 24 17 - 63 U/L   Alkaline Phosphatase 66 38 - 126 U/L   Total Bilirubin 0.7 0.3 - 1.2 mg/dL   GFR calc non Af Amer >60 >60 mL/min   GFR calc Af Amer >60 >60 mL/min   Anion gap 9 5 - 15  Ethanol  Result Value Ref Range   Alcohol, Ethyl (B) <5 <5 mg/dL  Acetaminophen level  Result Value Ref Range   Acetaminophen (Tylenol), Serum <10 (L) 10 - 30 ug/mL  Salicylate level  Result Value Ref Range   Salicylate Lvl 12.2 2.8 - 30.0 mg/dL  CBC with Differential  Result Value Ref Range   WBC 8.1 4.0 - 10.5 K/uL   RBC 4.69 4.22 - 5.81 MIL/uL   Hemoglobin 15.7 13.0 - 17.0 g/dL   HCT 04.5 40.9 - 81.1 %   MCV 96.8 78.0 - 100.0 fL   MCH 33.5 26.0 - 34.0 pg   MCHC 34.6 30.0 - 36.0 g/dL   RDW 91.4 78.2 - 95.6 %   Platelets 161 150 - 400 K/uL   Neutrophils Relative % 63 %   Neutro Abs 5.0 1.7 - 7.7 K/uL   Lymphocytes Relative 30 %   Lymphs Abs 2.5 0.7 - 4.0 K/uL   Monocytes Relative 6 %   Monocytes Absolute 0.5 0.1 - 1.0 K/uL   Eosinophils Relative  1 %   Eosinophils Absolute 0.1 0.0 - 0.7 K/uL   Basophils Relative 0 %   Basophils Absolute 0.0 0.0 - 0.1 K/uL  Urine rapid drug screen (hosp performed)  Result Value Ref Range   Opiates NONE DETECTED NONE DETECTED   Cocaine NONE DETECTED NONE DETECTED  Benzodiazepines NONE DETECTED NONE DETECTED   Amphetamines NONE DETECTED NONE DETECTED   Tetrahydrocannabinol NONE DETECTED NONE DETECTED   Barbiturates NONE DETECTED NONE DETECTED   Laboratory interpretation all normal     EKG  EKG Interpretation None       Radiology No results found.  Procedures Procedures (including critical care time)  Medications Ordered in ED Medications  acetaminophen (TYLENOL) tablet 650 mg (not administered)  ibuprofen (ADVIL,MOTRIN) tablet 600 mg (not administered)  zolpidem (AMBIEN) tablet 10 mg (not administered)  nicotine (NICODERM CQ - dosed in mg/24 hours) patch 21 mg (not administered)  ondansetron (ZOFRAN) tablet 4 mg (not administered)  alum & mag hydroxide-simeth (MAALOX/MYLANTA) 200-200-20 MG/5ML suspension 30 mL (not administered)     Initial Impression / Assessment and Plan / ED Course  I have reviewed the triage vital signs and the nursing notes.  Pertinent labs & imaging results that were available during my care of the patient were reviewed by me and considered in my medical decision making (see chart for details).  Clinical Course   Psych holding orders and TTS consult ordered at 06:35 am after reviewing his labs.    Final Clinical Impressions(s) / ED Diagnoses   Final diagnoses:  Visual hallucinations  Auditory hallucination    Disposition pending  Devoria Albe, MD, Concha Pyo, MD 06/16/16 [phone]

## 2016-06-16 NOTE — [facility] Suicide Risk Assessment (Signed)
Suicide Risk Assessment  Discharge Assessment   [facility]   Principal Problem: Schizoaffective disorder, bipolar type [facility](HCC) Discharge Diagnoses:  Patient Active Problem List   Diagnosis Date Noted  . Schizoaffective disorder, bipolar type (HCC) [F25.0] 04/20/2012    Priority: High  . Aggressive behavior [F60.89] 06/14/2016  . Tobacco use disorder [F17.200] 04/04/2016    Total Time spent with patient: 45 minutes  Musculoskeletal: Strength & Muscle Tone: within normal limits Gait & Station: normal Patient leans: N/A  Psychiatric Specialty Exam:   Blood pressure 116/79, pulse 66, temperature 98.2 F (36.8 C), temperature source Oral, resp. rate 16, SpO2 100 %.There is no height or weight on file to calculate BMI.  General Appearance: Disheveled  Eye Contact::  Good  Speech:  Normal Rate409  Volume:  Normal  Mood:  Irritable  Affect:  Congruent  Thought Process:  Coherent and Descriptions of Associations: Intact  Orientation:  Full (Time, Place, and Person)  Thought Content:  WDL  Suicidal Thoughts:  No  Homicidal Thoughts:  No  Memory:  Immediate;   Good Recent;   Good Remote;   Good  Judgement:  Fair  Insight:  Fair  Psychomotor Activity:  Normal  Concentration:  Good  Recall:  Good  Fund of Knowledge:Fair  Language: Good  Akathisia:  No  Handed:  Right  AIMS (if indicated):     Assets:  Leisure Time Physical Health Resilience Social Support  Sleep:     Cognition: WNL  ADL's: Normal   Mental Status Per Nursing Assessment::   On Admission:   hallucinations  Demographic Factors:  Male  Loss Factors: NA  Historical Factors: NA  Risk Reduction Factors:   Sense of responsibility to family, Positive social support and Positive therapeutic relationship  Continued Clinical Symptoms:  Irritable at times  Cognitive Features That Contribute To Risk:  None    Suicide Risk:  Minimal: No identifiable suicidal ideation.   Patients presenting with no risk factors but with morbid ruminations; may be classified as minimal risk based on the severity of the depressive symptoms    Plan Of Care/Follow-up recommendations:  Activity:  as tolerated Diet:  heart healthy deit  LORD, JAMISON, NP 06/16/2016, 11:09 AM

## 2016-06-16 NOTE — BH Assessment (Addendum)
Tele Assessment Note   Ryan York is an 45 y.o. male who presents accompanied by police and per chart he was picked up by police at his parent's house because he was banging on their door.  Pt reports symptoms of hallucinations, "hearing voices behind me and seeing people and things 'coming down'. It was really scary and it has never been that bad before. It was like it was [place] and there were all of these people here from [place]. I can't get my mind clear--I don't know what is going on with my mind".  Pt has a history of schizoaffective disorder and per chart is followed by [facility] team. He also [facility].  Pt reports medication non-compliance or inconsistency lately.  Pt denies current suicidal ideation or past attempts. Pt acknowledges symptoms including: irritability, trouble sleeping. PTdenies homicidal ideation/ history of violence.  Pt states current stressors include his living situation.  Pt lives with his parents, and he lists mom as his support but admits recent conflict. Pt denies history of abuse and trauma. Pt has poor insight due to mental status and poor judgment. Pt's memory is impaired due to metal status. Legal history includes a charge of trespassing with a ct date in October per pt. ? IP history includes multiple past admissions, including [facility] and [facility]. Pt denies alcohol/ substance abuse. ? MSE: Pt is disheveled, dressed in scrubs, drowsy, oriented x3 with unclear, pressured speech and restless motor behavior. Eye contact is poor due to drowsiness. Pt's mood is anxious/depressed and affect is congruent with mood. Thought process is tangential with some thought blocking. Pt may be responding to internal stimuli and experiencing delusional thought content. Pt was cooperative throughout assessment.   Disposition: discharge to current provider per Dr. Shea StakesAkintayo/Jamison,  DNP  Diagnosis: Schizoaffective Disorder    Past Medical History:  Past Medical History:  Diagnosis Date  . Asthma   . Bipolar disorder (HCC)   . Hypertension   . Schizophrenia [facility](HCC)     Past Surgical History:  Procedure Laterality Date  . KNEE SURGERY      Family History:  Family History  Problem Relation Age of Onset  . Family history unknown: Yes    Social History:  reports that he has been smoking Cigars.  He has a 5.00 pack-year smoking history. He has never used smokeless tobacco. He reports that he drinks alcohol. He reports that he does not use drugs.  Additional Social History:  Alcohol / Drug Use Pain Medications: Denies Prescriptions: Denies Over the Counter: Denies History of alcohol / drug use?: No history of alcohol / drug abuse Longest period of sobriety (when/how long): NA Negative Consequences of Use:  (denies)  CIWA: CIWA-Ar BP: 116/79 Pulse Rate: 66 COWS:    PATIENT STRENGTHS: (choose at least two) Communication skills Motivation for treatment/growth Physical Health Supportive family/friends  Allergies:  Allergies  Allergen Reactions  . Vistaril [Hydroxyzine] Nausea Only    Severe stomach pain    Home Medications:  (Not in a hospital admission)  OB/GYN Status:  No LMP for male patient.  General Assessment Data [facility] of Assessment: [facility] TTS Assessment: In system Is this a Tele or Face-to-Face Assessment?: Face-to-Face Is this an Initial Assessment or a Re-assessment for this encounter?: Initial Assessment Marital status: Single Is patient pregnant?: No Pregnancy Status: No Living Arrangements: Parent Can pt return to current living arrangement?: Yes Admission Status: Voluntary Is  patient capable of signing voluntary admission?: Yes Referral Source: Self/Family/Friend Insurance type: MCD     Crisis Care Plan Living Arrangements: Parent Legal Guardian: Other: (self) Name of Psychiatrist: [facility] Name of  Therapist: [facility]  Education Status Is patient currently in school?: No Highest grade of school patient has completed: 22 Name of school: [employer] person: NA  Risk to self with the past 6 months Suicidal Ideation: No Has patient been a risk to self within the past 6 months prior to admission? : No Suicidal Intent: No Has patient had any suicidal intent within the past 6 months prior to admission? : No Is patient at risk for suicide?: No Suicidal Plan?: No Has patient had any suicidal plan within the past 6 months prior to admission? : No Access to Means: No What has been your use of drugs/alcohol within the last 12 months?:  (denies) Previous Attempts/Gestures: No How many times?: 0 Other Self Harm Risks:  (none) Triggers for Past Attempts:  (NA) Intentional Self Injurious Behavior: None Family Suicide History: No Recent stressful life event(s): Conflict (Comment) (with mom, wants own housing) Persecutory voices/beliefs?: Yes Depression: Yes Depression Symptoms: Feeling angry/irritable, Feeling worthless/self pity, Insomnia, Fatigue Substance abuse history and/or treatment for substance abuse?: No Suicide prevention information given to non-admitted patients: Not applicable  Risk to Others within the past 6 months Homicidal Ideation: No Does patient have any lifetime risk of violence toward others beyond the six months prior to admission? : No Thoughts of Harm to Others: No Current Homicidal Intent: No Current Homicidal Plan: No Access to Homicidal Means: No History of harm to others?: No Assessment of Violence: None Noted Violent Behavior Description:  (verbally aggressive to mom Firday night) Does patient have access to weapons?: No Criminal Charges Pending?: Yes Describe Pending Criminal Charges: trespassing Does patient have a court date: Yes Court Date:  ("some time in October") Is patient on probation?: No  Psychosis Hallucinations: Auditory,  Visual Delusions: None noted  Mental Status Report Appearance/Hygiene: In scrubs, Disheveled Eye Contact: Fair Motor Activity: Restlessness Speech: Rapid, Pressured, Incoherent (incoherent at times) Level of Consciousness: Drowsy Mood: Depressed, Anxious Affect: Depressed, Irritable Anxiety Level: Moderate Thought Processes: Thought Blocking, Tangential Judgement: Impaired Orientation: Person, Place, Situation Obsessive Compulsive Thoughts/Behaviors: Minimal  Cognitive Functioning Concentration: Decreased Memory: Recent Impaired, Remote Intact IQ: Average Insight: Poor Impulse Control: Poor Appetite: Good Weight Loss: 0 Weight Gain: 0 Sleep: Decreased Total Hours of Sleep:  ("depends on where I am") Vegetative Symptoms: Decreased grooming  ADLScreening [facility] Assessment Services) Patient's cognitive ability adequate to safely complete daily activities?: Yes Patient able to express need for assistance with ADLs?: Yes Independently performs ADLs?: Yes (appropriate for developmental age)  Prior Inpatient Therapy Prior Inpatient Therapy: Yes Prior Therapy Dates: 2010,2012,2013,2017 Prior Therapy Facilty/Provider(s): [facility], [facility]  Reason for Treatment: Schizoaffective Disorder  Prior Outpatient Therapy Prior Outpatient Therapy: Yes Prior Therapy Dates: Present Prior Therapy Facilty/Provider(s): [facility] Reason for Treatment: Schizoaffective Disorder Does patient have an ACCT team?: Yes Does patient have Intensive In-House Services?  : No Does patient have [facility] services? : No Does patient have P4CC services?: No  ADL Screening (condition at time of admission) Patient's cognitive ability adequate to safely complete daily activities?: Yes Is the patient deaf or have difficulty hearing?: No Does the patient have difficulty seeing, even when wearing glasses/contacts?: No Does the patient have difficulty concentrating, remembering, or making decisions?: No Patient able  to express need for assistance with ADLs?: Yes Does the  patient have difficulty dressing or bathing?: No Independently performs ADLs?: Yes (appropriate for developmental age) Does the patient have difficulty walking or climbing stairs?: No Weakness of Legs: None Weakness of Arms/Hands: None  Home Assistive Devices/Equipment Home Assistive Devices/Equipment: None  Therapy Consults (therapy consults require a physician order) PT Evaluation Needed: No OT Evalulation Needed: No SLP Evaluation Needed: No Abuse/Neglect Assessment (Assessment to be complete while patient is alone) Physical Abuse: Denies Verbal Abuse: Denies Sexual Abuse: Denies Exploitation of patient/patient's resources: Denies Self-Neglect: Denies Values / Beliefs Cultural Requests During Hospitalization: None Spiritual Requests During Hospitalization: None   Advance Directives (For Healthcare) Does patient have an advance directive?: No Would patient like information on creating an advanced directive?: No - patient declined information    Additional Information 1:1 In Past 12 Months?: Yes CIRT Risk: No Elopement Risk: No Does patient have medical clearance?: Yes     Disposition:  Disposition Initial Assessment Completed for this Encounter: Yes Disposition of Patient: Other dispositions (awaiting psych review) Type of inpatient treatment program: Adult Type of outpatient treatment: Adult Other disposition(s): Other (Comment) (review by psych)  Theo Dills 06/16/2016 7:35 AM

## 2016-06-16 NOTE — ED Notes (Signed)
Patient requesting a vegetarian tray for breakfast.

## 2016-06-16 NOTE — ED Notes (Signed)
Patient from triage 5.  Sitting at bedside eating breakfast.  Introduced self to patient.  Patient calm and cooperative.

## 2016-06-16 NOTE — ED Notes (Signed)
Bed: WA29 Expected date:  Expected time:  Means of arrival:  Comments: 

## 2016-06-16 NOTE — ED Notes (Signed)
Patient states to this RN that he is having hallucinations again.  Patient states that takes medications but, last took on Thursday last week.  Patient denies SI/HI, Denies pain.

## 2016-06-16 NOTE — ED Triage Notes (Signed)
Patient bib [employer].  Patient picked up at parents house due to banging on their door.  Patient expressed to [employer] that he is hearing voices.  Patient states that he has not been taking his medications.

## 2016-06-16 NOTE — ED Notes (Signed)
Patient changed into burgundy scrubs and wanded by security. 

## 2016-06-16 NOTE — ED Notes (Signed)
Bed: WTR5 Expected date: 06/15/16 Expected time: 4:58 PM Means of arrival: Ambulance Comments:

## 2016-06-18 ENCOUNTER — Emergency Department (HOSPITAL_COMMUNITY)
Admission: EM | Admit: 2016-06-18 | Discharge: 2016-06-18 | Disposition: A | Discharge status: Home / Self Care | Payer: Medicaid Other | Attending: Emergency Medicine | Admitting: Emergency Medicine

## 2016-06-18 ENCOUNTER — Encounter (HOSPITAL_COMMUNITY): Payer: Self-pay

## 2016-06-18 DIAGNOSIS — F250 Schizoaffective disorder, bipolar type: Secondary | ICD-10-CM | POA: Diagnosis not present

## 2016-06-18 DIAGNOSIS — R440 Auditory hallucinations: Secondary | ICD-10-CM | POA: Diagnosis not present

## 2016-06-18 DIAGNOSIS — Z79899 Other long term (current) drug therapy: Secondary | ICD-10-CM

## 2016-06-18 DIAGNOSIS — I10 Essential (primary) hypertension: Secondary | ICD-10-CM | POA: Diagnosis not present

## 2016-06-18 DIAGNOSIS — J45909 Unspecified asthma, uncomplicated: Secondary | ICD-10-CM | POA: Diagnosis not present

## 2016-06-18 DIAGNOSIS — F17210 Nicotine dependence, cigarettes, uncomplicated: Secondary | ICD-10-CM | POA: Insufficient documentation

## 2016-06-18 DIAGNOSIS — R443 Hallucinations, unspecified: Secondary | ICD-10-CM

## 2016-06-18 LAB — CBC
HCT: 43 % (ref 39.0–52.0)
Hemoglobin: 15.1 g/dL (ref 13.0–17.0)
MCH: 33.9 pg (ref 26.0–34.0)
MCHC: 35.1 g/dL (ref 30.0–36.0)
MCV: 96.6 fL (ref 78.0–100.0)
PLATELETS: 164 10*3/uL (ref 150–400)
RBC: 4.45 MIL/uL (ref 4.22–5.81)
RDW: 13.3 % (ref 11.5–15.5)
WBC: 5.9 10*3/uL (ref 4.0–10.5)

## 2016-06-18 LAB — COMPREHENSIVE METABOLIC PANEL
ALT: 26 U/L (ref 17–63)
AST: 29 U/L (ref 15–41)
Albumin: 4.2 g/dL (ref 3.5–5.0)
Alkaline Phosphatase: 60 U/L (ref 38–126)
Anion gap: 5 (ref 5–15)
BUN: 17 mg/dL (ref 6–20)
CHLORIDE: 109 mmol/L (ref 101–111)
CO2: 26 mmol/L (ref 22–32)
Calcium: 9 mg/dL (ref 8.9–10.3)
Creatinine, Ser: 1.13 mg/dL (ref 0.61–1.24)
GFR calc Af Amer: >60 mL/min (ref >60)
Glucose, Bld: 106 mg/dL — ABNORMAL HIGH (ref 65–99)
POTASSIUM: 3.7 mmol/L (ref 3.5–5.1)
SODIUM: 140 mmol/L (ref 135–145)
Total Bilirubin: 0.6 mg/dL (ref 0.3–1.2)
Total Protein: 7.7 g/dL (ref 6.5–8.1)

## 2016-06-18 LAB — RAPID URINE DRUG SCREEN, HOSP PERFORMED
AMPHETAMINES: NOT DETECTED
BENZODIAZEPINES: NOT DETECTED
Barbiturates: NOT DETECTED
Cocaine: NOT DETECTED
OPIATES: NOT DETECTED
Tetrahydrocannabinol: NOT DETECTED

## 2016-06-18 LAB — ACETAMINOPHEN LEVEL: Acetaminophen (Tylenol), Serum: <10 ug/mL — ABNORMAL LOW (ref 10–30)

## 2016-06-18 LAB — SALICYLATE LEVEL: SALICYLATE LVL: 8.9 mg/dL (ref 2.8–30.0)

## 2016-06-18 LAB — ETHANOL

## 2016-06-18 MED ORDER — LORAZEPAM 1 MG PO TABS: 1.0000 mg | ORAL_TABLET | Freq: Three times a day (TID) | ORAL | Status: DC | PRN

## 2016-06-18 MED ORDER — IBUPROFEN 200 MG PO TABS: 600.0000 mg | ORAL_TABLET | Freq: Three times a day (TID) | ORAL | Status: DC | PRN

## 2016-06-18 MED ORDER — ONDANSETRON HCL 4 MG PO TABS: 4.0000 mg | ORAL_TABLET | Freq: Three times a day (TID) | ORAL | Status: DC | PRN

## 2016-06-18 MED ORDER — ACETAMINOPHEN 325 MG PO TABS
650.0000 mg | ORAL_TABLET | ORAL | Status: DC | PRN
  Administered 2016-06-18: 650 mg via ORAL
  Filled 2016-06-18: qty 2

## 2016-06-18 MED ORDER — ALUM & MAG HYDROXIDE-SIMETH 200-200-20 MG/5ML PO SUSP: 30.0000 mL | ORAL | Status: DC | PRN

## 2016-06-18 NOTE — [facility] Suicide Risk Assessment (Signed)
Suicide Risk Assessment  Discharge Assessment   [facility]   Principal Problem: Schizoaffective disorder, bipolar type [facility](HCC) Discharge Diagnoses:  Patient Active Problem List   Diagnosis Date Noted  . Aggressive behavior [F60.89] 06/14/2016  . Tobacco use disorder [F17.200] 04/04/2016  . Schizoaffective disorder, bipolar type (HCC) [F25.0] 04/20/2012    Total Time spent with patient: 15 minutes  Musculoskeletal: Strength & Muscle Tone: within normal limits Gait & Station: normal Patient leans: N/A  Psychiatric Specialty Exam: Blood pressure 124/77, pulse (!) 56, temperature 97.5 F (36.4 C), temperature source Oral, resp. rate 18, SpO2 100 %.There is no height or weight on file to calculate BMI.  General Appearance: Fairly Groomed  Eye Contact:  Good  Speech:  Clear and Coherent and Normal Rate  Volume:  Normal  Mood:  NA  Affect:  Appropriate  Thought Process:  Coherent  Orientation:  Full (Time, Place, and Person)  Thought Content:  Hallucinations: Auditory  Suicidal Thoughts:  No  Homicidal Thoughts:  No  Memory:  Immediate;   Good Recent;   Fair Remote;   Fair  Judgement:  Fair  Insight:  Fair  Psychomotor Activity:  Normal  Concentration:  Concentration: Fair and Attention Span: Fair  Recall:  [employer]  Fund of Knowledge:  Fair  Language:  Fair  Akathisia:  No  Handed:  Right  AIMS (if indicated):     Assets:  Communication Skills Desire for Improvement Housing Physical Health Resilience  ADL's:  Intact  Cognition:  WNL  Sleep:      Mental Status Per Nursing Assessment::   On Admission:     Demographic Factors:  Male, Low socioeconomic status and Unemployed  Loss Factors: NA  Historical Factors: Impulsivity  Risk Reduction Factors:   Living with another person, especially a relative, Positive social support and Positive therapeutic relationship  Continued Clinical Symptoms:  Schizophrenia - auditory  hallucinations  Cognitive Features That Contribute To Risk:  Closed-mindedness    Suicide Risk:  Minimal: No identifiable suicidal ideation.  Patients presenting with no risk factors but with morbid ruminations; may be classified as minimal risk based on the severity of the depressive symptoms     Plan Of Care/Follow-up recommendations:  Activity:  As tolerated  Diet:  Regular Tests:  As determined by PCP Other:  Keep any follow-up appointments with your outpatient provider  1.Take all your medications as prescribed.  2. Report any adverse side effects to your medication to your outpatient provider. 3. Do not use alcohol or illegal drugs while taking prescription medications. 4. In the event of worsening symptoms, call 911, the crisis hotline or go to nearest emergency room for evaluation of symptoms.  Alberteen SamFran Kelcey Wickstrom, FNP-BC [facility] 06/18/2016, 12:13 PM

## 2016-06-18 NOTE — ED Notes (Signed)
Bed: [facility] Expected date:  Expected time:  Means of arrival:  Comments: 

## 2016-06-18 NOTE — ED Notes (Signed)
Pt discharged home. Discharged instructions read to pt who verbalized understanding. All belongings returned to pt who signed for same. Denies SI/HI, is not delusional and not responding to internal stimuli. Escorted pt to the ED exit.    

## 2016-06-18 NOTE — ED Triage Notes (Signed)
Pt complains of left knee pain and a blister on his foot, he's requesting to be evaluated for auditory hallucinations and possibly be placed in a group home or somewhere [place] , he states there are several voices but he's trying not to be suicidal.

## 2016-06-18 NOTE — ED Provider Notes (Signed)
[facility]-EMERGENCY DEPT Provider Note   CSN: [id number] Arrival date & time: 06/18/16  0128  By signing my name below, I, Linna Darner, attest that this documentation has been prepared under the direction and in the presence of physician practitioner, Sabri Teal, MD. Electronically Signed: Linna Darner, Scribe. 06/18/2016. 2:12 AM.  History   Chief Complaint Chief Complaint  Patient presents with  . Hallucinations    The history is provided by the patient. No language interpreter was used.  Mental Health Problem  Presenting symptoms: hallucinations (auditory)   Presenting symptoms: no aggressive behavior, no self-mutilation and no suicidal thoughts   Degree of incapacity (severity):  Moderate Onset quality:  Gradual Timing:  Constant Progression:  Unchanged Chronicity:  Recurrent Context: not alcohol use   Treatment compliance:  Most of the time Relieved by:  Nothing Worsened by:  Nothing Ineffective treatments:  None tried Associated symptoms: no abdominal pain   Risk factors: hx of mental illness      HPI Comments: Ryan York is a 45 y.o. male with PMHx of bipolar disorder and schizophrenia who presents to the Emergency Department complaining of auditory and hallucinations over the past couple of weeks. Pt reports he has been homeless for three weeks and his hallucinations started shortly thereafter. Pt states the voices that he hears are "talking trash" but are not instructing him to hurt himself. Pt denies alcohol or other drug use. He notes he has been taking his mental health medications as prescribed. He denies suicidal ideation, self-injury, homicidal ideation, or any other associated symptoms.  Past Medical History:  Diagnosis Date  . Asthma   . Bipolar disorder (HCC)   . Hypertension   . Schizophrenia [facility])     Patient Active Problem List   Diagnosis Date Noted  . Aggressive behavior 06/14/2016  . Tobacco use disorder 04/04/2016  . Schizoaffective  disorder, bipolar type (HCC) 04/20/2012    Past Surgical History:  Procedure Laterality Date  . KNEE SURGERY         Home Medications    Prior to Admission medications   Medication Sig Start Date End Date Taking? Authorizing Provider  albuterol (PROVENTIL HFA;VENTOLIN HFA) 108 (90 Base) MCG/ACT inhaler Inhale 1-2 puffs into the lungs every 6 (six) hours as needed for wheezing or shortness of breath.   Yes Historical Provider, MD  QUEtiapine (SEROQUEL XR) 400 MG 24 hr tablet Take 2 tablets (800 mg total) by mouth at bedtime. 04/10/16  Yes Shari Prows, MD    Family History Family History  Problem Relation Age of Onset  . Family history unknown: Yes    Social History Social History  Substance Use Topics  . Smoking status: Current Some Day Smoker    Packs/day: 0.50    Years: 10.00    Types: Cigars  . Smokeless tobacco: Never Used  . Alcohol use Yes     Comment: Pt stated that he rarely consumes alcohol     Allergies   Vistaril [hydroxyzine]   Review of Systems Review of Systems  Gastrointestinal: Negative for abdominal pain.  Psychiatric/Behavioral: Positive for hallucinations (auditory). Negative for self-injury and suicidal ideas.       Negative for homicidal ideas.  All other systems reviewed and are negative.   Physical Exam Updated Vital Signs BP 117/88 (BP Location: Right Arm)   Pulse 80   Temp 98.8 F (37.1 C) (Oral)   Resp 20   SpO2 98%   Physical Exam  Constitutional: He appears well-developed and well-nourished.  HENT:  Head: Normocephalic.  Mouth/Throat: Oropharynx is clear and moist. No oropharyngeal exudate.  Eyes: Conjunctivae and EOM are normal. Pupils are equal, round, and reactive to light. Right eye exhibits no discharge. Left eye exhibits no discharge. No scleral icterus.  Neck: Normal range of motion. Neck supple. No JVD present. No tracheal deviation present.  Trachea is midline. No stridor or carotid bruits.  Cardiovascular:  Normal rate, regular rhythm, normal heart sounds and intact distal pulses.   No murmur heard. Pulmonary/Chest: Effort normal and breath sounds normal. No stridor. No respiratory distress. He has no wheezes. He has no rales.  Lungs CTA bilaterally.  Abdominal: Soft. Bowel sounds are normal. He exhibits no distension. There is no tenderness. There is no rebound and no guarding.  Musculoskeletal: Normal range of motion. He exhibits no edema or tenderness.  Lymphadenopathy:    He has no cervical adenopathy.  Neurological: He is alert. He has normal reflexes.  Skin: Skin is warm and dry. Capillary refill takes less than 2 seconds.  Psychiatric: He has a normal mood and affect.  Tangential and circumstantial.  Nursing note and vitals reviewed.   ED Treatments / Results  Labs (all labs ordered are listed, but only abnormal results are displayed) Labs Reviewed  CBC  COMPREHENSIVE METABOLIC PANEL  ETHANOL  SALICYLATE LEVEL  ACETAMINOPHEN LEVEL  URINE RAPID DRUG SCREEN, HOSP PERFORMED    Radiology No results found.  Procedures Procedures (including critical care time)  DIAGNOSTIC STUDIES: Oxygen Saturation is 98% on RA, normal by my interpretation.    COORDINATION OF CARE: 2:12 AM Discussed treatment plan with pt at bedside and pt agreed to plan.  Medications Ordered in ED Medications - No data to display   Initial Impression / Assessment and Plan / ED Course  I have reviewed the triage vital signs and the nursing notes.  Pertinent labs & imaging results that were available during my care of the patient were reviewed by me and considered in my medical decision making (see chart for details).  Clinical Course   Vitals:   06/18/16 0135  BP: 117/88  Pulse: 80  Resp: 20  Temp: 98.8 F (37.1 C)   Medications  alum & mag hydroxide-simeth (MAALOX/MYLANTA) 200-200-20 MG/5ML suspension 30 mL (not administered)  ondansetron (ZOFRAN) tablet 4 mg (not administered)    acetaminophen (TYLENOL) tablet 650 mg (not administered)  ibuprofen (ADVIL,MOTRIN) tablet 600 mg (not administered)  LORazepam (ATIVAN) tablet 1 mg (not administered)    I personally performed the services described in this documentation, which was scribed in my presence. The recorded information has been reviewed and is accurate.     Final Clinical Impressions(s) / ED Diagnoses   Final diagnoses:  None  Medically cleared by EDP.  Transfer to SAPU to be seen by TTS, dispo per TTS  New Prescriptions New Prescriptions   No medications on file     Aaren Krog, MD 06/18/16 [id number]

## 2016-06-18 NOTE — ED Notes (Signed)
EDP at bedside  

## 2016-06-18 NOTE — ED Notes (Signed)
Patient A/O, no noted distress. At the time of assessment, patient denies SI/HI/AH/pain. Patient stated all he wants to do is sleep. His knee is hurting from all the walking he has been doing. Left great toe has apprx. 0.5 cm open area, no noted drainage. Patient was educated on the unit rules and patient's rights. Patient stated he had no one he would like to add to visitation/telephone consent. Denies any abnormalities of review, but it is noted in EMR "HTN." Staff will continue to monitor, meet needs, and maintain safety.

## 2016-06-18 NOTE — Discharge Instructions (Addendum)
Please contact your current provider [facility]  [phone], [address] [place] [address]   1.Take all your medications as prescribed.  2. Report any adverse side effects to your medication to your outpatient provider. 3. Do not use alcohol or illegal drugs while taking prescription medications. 4. In the event of worsening symptoms, call 911, the crisis hotline or go to nearest emergency room for evaluation of symptoms.

## 2016-06-18 NOTE — ED Triage Notes (Signed)
Pt here for left knee pain and auditory hallucinations

## 2016-06-18 NOTE — Progress Notes (Signed)
Pt with [facility], [facility] in which he was informed to follow up with [facility]  On 04/17/2016.   Why:  Your hospital follow up appointment will be on Thursday July 20th at 12:00 with your therapist.   Contact information:  [address],  [place], [place] [address] Phone: [phone] Fax: [phone]   After this d/c Pt had an ED visits on 04/18/16, 7/23 x 2 , 7/25, 7/27x 2 and 04/27/16  No ED CP Not a [facility] candidate Has a medicaid assigned MD, E Avbuere This ED visit pt c/o hearing voices, left knee pain, wanting [place] go [place] home [place]

## 2016-06-18 NOTE — Consult Note (Signed)
[place] Psychiatry Consult   Reason for Consult:  Psychiatric Evaluation Referring Physician:  EDP Patient Identification: Ryan York MRN:  [id number] Principal Diagnosis: Schizoaffective disorder, bipolar type [facility]) Diagnosis:   Patient Active Problem List   Diagnosis Date Noted  . Aggressive behavior [F60.89] 06/14/2016  . Tobacco use disorder [F17.200] 04/04/2016  . Schizoaffective disorder, bipolar type ([place]) [F25.0] 04/20/2012    Total Time spent with patient: 30 minutes  Subjective:   Ryan York is a 45 y.o. male patient who states "I was seeing a lot of angels last night."  HPI:  Per Behavioral Health Therapeutic Triage assessment, Ryan York is an 45 y.o. male who presents unaccompanied to complaining of auditory hallucinations. Pt says that he is hearing several voices berating him and he needs to clear his mind. Pt presented to [facility] with similar symptoms and was discharged less than 48 hours ago to outpatient resources. Pt says he wants to be placed in a group home in [place]. He denies current suicidal ideation. He denies current homicidal ideation. Pt denies alcohol or substance use. Pt reports he is taking psychiatric medications as prescribed.  Pt is a poor historian and says he wants to sleep. Pt's chart indicates he is followed by [facility] team but Pt denies this. He reported being homeless to EDP but says during assessment he stays with his mother. Pt has a pending court date in October for trespassing.   Pt is disheveled and dressed in hospital scrubs. He is drowsy and didn't appear to want to stay awake during assessment. He is oriented to person, place and situation but not date. Speech is soft and mumbled. Motor behavior is within normal limits. Eye contact is minimal. Pt's mood is euthymic and affect is congruent with mood. Thought process is coherent. Pt says he wants to be admitted to a psychiatric hospital or group  home.  06/18/16 Today he is seen with Dr. Darleene Cleaver. He is alert and cooperative. Ryan York continues to endorse auditory hallucinations. He states the hallucinations are not distressing for him. He states "they are certain times of the year that they get worse." He denies homicidal or suicidal ideation, intent or plan.   Past Psychiatric History: Schizaphrenia  Risk to Self: Suicidal Ideation: No Suicidal Intent: No Is patient at risk for suicide?: No Suicidal Plan?: No Access to Means: No What has been your use of drugs/alcohol within the last 12 months?: Pt denies How many times?: 0 Other Self Harm Risks: None Triggers for Past Attempts: None known Intentional Self Injurious Behavior: None Risk to Others: Homicidal Ideation: No Thoughts of Harm to Others: No Comment - Thoughts of Harm to Others: None Current Homicidal Intent: No Current Homicidal Plan: No Access to Homicidal Means: No Identified Victim: None History of harm to others?: No Assessment of Violence: In past 6-12 months Violent Behavior Description: History of verbal aggression Does patient have access to weapons?: No Criminal Charges Pending?: Yes Describe Pending Criminal Charges: Trespassing Does patient have a court date: Yes Court Date: 07/18/16 Prior Inpatient Therapy: Prior Inpatient Therapy: Yes Prior Therapy Dates: 2010,2012,2013,2017 Prior Therapy Facilty/Provider(s): [facility]  Reason for Treatment: Schizoaffective Disorder Prior Outpatient Therapy: Prior Outpatient Therapy: Yes Prior Therapy Dates: Present Prior Therapy Facilty/Provider(s): [facility] Reason for Treatment: Schizoaffective Disorder Does patient have an ACCT team?: No Does patient have Intensive In-House Services?  : No Does patient have [facility] services? : Yes Does patient have [facility] services?: No  Past Medical History:  Past Medical History:  Diagnosis Date  . Asthma   . Bipolar disorder ([facility])   . Hypertension   . Schizophrenia  [facility])     Past Surgical History:  Procedure Laterality Date  . KNEE SURGERY     Family History:  Family History  Problem Relation Age of Onset  . Family history unknown: Yes   Family Psychiatric  History: unknown Social History:  History  Alcohol Use  . Yes    Comment: Pt stated that he rarely consumes alcohol     History  Drug Use No    Comment: Pt denies     Social History   Social History  . Marital status: Single    Spouse name: N/A  . Number of children: N/A  . Years of education: N/A   Social History Main Topics  . Smoking status: Current Some Day Smoker    Packs/day: 0.50    Years: 10.00    Types: Cigars  . Smokeless tobacco: Never Used  . Alcohol use Yes     Comment: Pt stated that he rarely consumes alcohol  . Drug use: No     Comment: Pt denies   . Sexual activity: Not Asked   Other Topics Concern  . None   Social History Narrative  . None   Additional Social History:    Allergies:   Allergies  Allergen Reactions  . Vistaril [Hydroxyzine] Nausea Only    Severe stomach pain    Labs:  Results for orders placed or performed during the hospital encounter of 06/18/16 (from the past 48 hour(s))  Comprehensive metabolic panel     Status: Abnormal   Collection Time: 06/18/16  1:48 AM  Result Value Ref Range   Sodium 140 135 - 145 mmol/L   Potassium 3.7 3.5 - 5.1 mmol/L   Chloride 109 101 - 111 mmol/L   CO2 26 22 - 32 mmol/L   Glucose, Bld 106 (H) 65 - 99 mg/dL   BUN 17 6 - 20 mg/dL   Creatinine, Ser 1.13 0.61 - 1.24 mg/dL   Calcium 9.0 8.9 - 10.3 mg/dL   Total Protein 7.7 6.5 - 8.1 g/dL   Albumin 4.2 3.5 - 5.0 g/dL   AST 29 15 - 41 U/L   ALT 26 17 - 63 U/L   Alkaline Phosphatase 60 38 - 126 U/L   Total Bilirubin 0.6 0.3 - 1.2 mg/dL   GFR calc non Af Amer >60 >60 mL/min   GFR calc Af Amer >60 >60 mL/min    Comment: (NOTE) The eGFR has been calculated using the CKD EPI equation. This calculation has not been validated in all clinical  situations. eGFR's persistently <60 mL/min signify possible Chronic Kidney Disease.    Anion gap 5 5 - 15  Ethanol     Status: None   Collection Time: 06/18/16  1:48 AM  Result Value Ref Range   Alcohol, Ethyl (B) <5 <5 mg/dL    Comment:        LOWEST DETECTABLE LIMIT FOR SERUM ALCOHOL IS 5 mg/dL FOR MEDICAL PURPOSES ONLY   Salicylate level     Status: None   Collection Time: 06/18/16  1:48 AM  Result Value Ref Range   Salicylate Lvl 8.9 2.8 - 30.0 mg/dL  Acetaminophen level     Status: Abnormal   Collection Time: 06/18/16  1:48 AM  Result Value Ref Range   Acetaminophen (Tylenol), Serum <10 (L) 10 - 30 ug/mL    Comment:  THERAPEUTIC CONCENTRATIONS VARY SIGNIFICANTLY. A RANGE OF 10-30 ug/mL MAY BE AN EFFECTIVE CONCENTRATION FOR MANY PATIENTS. HOWEVER, SOME ARE BEST TREATED AT CONCENTRATIONS OUTSIDE THIS RANGE. ACETAMINOPHEN CONCENTRATIONS >150 ug/mL AT 4 HOURS AFTER INGESTION AND >50 ug/mL AT 12 HOURS AFTER INGESTION ARE OFTEN ASSOCIATED WITH TOXIC REACTIONS.   cbc     Status: None   Collection Time: 06/18/16  1:48 AM  Result Value Ref Range   WBC 5.9 4.0 - 10.5 K/uL   RBC 4.45 4.22 - 5.81 MIL/uL   Hemoglobin 15.1 13.0 - 17.0 g/dL   HCT 43.0 39.0 - 52.0 %   MCV 96.6 78.0 - 100.0 fL   MCH 33.9 26.0 - 34.0 pg   MCHC 35.1 30.0 - 36.0 g/dL   RDW 13.3 11.5 - 15.5 %   Platelets 164 150 - 400 K/uL  Rapid urine drug screen (hospital performed)     Status: None   Collection Time: 06/18/16  3:20 AM  Result Value Ref Range   Opiates NONE DETECTED NONE DETECTED   Cocaine NONE DETECTED NONE DETECTED   Benzodiazepines NONE DETECTED NONE DETECTED   Amphetamines NONE DETECTED NONE DETECTED   Tetrahydrocannabinol NONE DETECTED NONE DETECTED   Barbiturates NONE DETECTED NONE DETECTED    Comment:        DRUG SCREEN FOR MEDICAL PURPOSES ONLY.  IF CONFIRMATION IS NEEDED FOR ANY PURPOSE, NOTIFY LAB WITHIN 5 DAYS.        LOWEST DETECTABLE LIMITS FOR URINE DRUG  SCREEN Drug Class       Cutoff (ng/mL) Amphetamine      1000 Barbiturate      200 Benzodiazepine   106 Tricyclics       269 Opiates          300 Cocaine          300 THC              50     Current Facility-Administered Medications  Medication Dose Route Frequency Provider Last Rate Last Dose  . acetaminophen (TYLENOL) tablet 650 mg  650 mg Oral Q4H PRN April Palumbo, MD      . alum & mag hydroxide-simeth (MAALOX/MYLANTA) 200-200-20 MG/5ML suspension 30 mL  30 mL Oral PRN April Palumbo, MD      . ibuprofen (ADVIL,MOTRIN) tablet 600 mg  600 mg Oral Q8H PRN April Palumbo, MD      . LORazepam (ATIVAN) tablet 1 mg  1 mg Oral Q8H PRN April Palumbo, MD      . ondansetron [facility]) tablet 4 mg  4 mg Oral Q8H PRN April Palumbo, MD       Current Outpatient Prescriptions  Medication Sig Dispense Refill  . albuterol (PROVENTIL HFA;VENTOLIN HFA) 108 (90 Base) MCG/ACT inhaler Inhale 1-2 puffs into the lungs every 6 (six) hours as needed for wheezing or shortness of breath.    . QUEtiapine (SEROQUEL XR) 400 MG 24 hr tablet Take 2 tablets (800 mg total) by mouth at bedtime. 60 tablet 1    Musculoskeletal: Strength & Muscle Tone: within normal limits Gait & Station: normal Patient leans: N/A  Psychiatric Specialty Exam: Physical Exam  Constitutional: He is oriented to person, place, and time. He appears well-developed and well-nourished.  HENT:  Head: Normocephalic.  Neck: Normal range of motion.  Respiratory: Effort normal.  Musculoskeletal: Normal range of motion.  Neurological: He is alert and oriented to person, place, and time.  Skin: Skin is warm and dry.  Psychiatric: He has a normal  mood and affect. His speech is normal. He is actively hallucinating.    Review of Systems  Constitutional: Negative.   HENT: Negative.   Eyes: Negative.   Respiratory: Negative.   Cardiovascular: Negative.   Gastrointestinal: Negative.   Genitourinary: Negative.   Musculoskeletal: Negative.    Skin: Negative.   Neurological: Negative.   Endo/Heme/Allergies: Negative.   Psychiatric/Behavioral: Positive for hallucinations.    Blood pressure 124/77, pulse (!) 56, temperature 97.5 F (36.4 C), temperature source Oral, resp. rate 18, SpO2 100 %.There is no height or weight on file to calculate BMI.  General Appearance: Fairly Groomed  Eye Contact:  Good  Speech:  Clear and Coherent and Normal Rate  Volume:  Normal  Mood:  NA  Affect:  Appropriate  Thought Process:  Coherent  Orientation:  Full (Time, Place, and Person)  Thought Content:  Hallucinations: Auditory  Suicidal Thoughts:  No  Homicidal Thoughts:  No  Memory:  Immediate;   Good Recent;   Fair Remote;   Fair  Judgement:  Fair  Insight:  Fair  Psychomotor Activity:  Normal  Concentration:  Concentration: Fair and Attention Span: Fair  Recall:  [employer] of Knowledge:  Fair  Language:  Fair  Akathisia:  No  Handed:  Right  AIMS (if indicated):     Assets:  Communication Skills Desire for Improvement Housing Physical Health Resilience  ADL's:  Intact  Cognition:  WNL  Sleep:       Case discussed with Dr. Darleene Cleaver; recommendations are: Treatment Plan Summary: Plan :Discharge home with follow up with ACT team and current outpatient provider  Disposition: No evidence of imminent risk to self or others at present.   Patient does not meet criteria for psychiatric inpatient admission. Supportive therapy provided about ongoing stressors. Discussed crisis plan, support from social network, calling 911, coming to the Emergency Department, and calling Suicide Hotline.  Serena Colonel, FNP-BC [place] 06/18/2016 11:57 AM  Patient seen face-to-face for psychiatric evaluation, chart reviewed and case discussed with the physician extender and developed treatment plan. Reviewed the information documented and agree with the treatment plan. Corena Pilgrim, MD

## 2016-06-18 NOTE — BH Assessment (Addendum)
Tele Assessment Note   Ryan York is an 45 y.o. male who presents unaccompanied to complaining of auditory hallucinations. Pt says that he is hearing several voices berating him and he needs to clear his mind. Pt presented to [facility] with similar symptoms and was discharged less than 48 hours ago to outpatient resources. Pt says he wants to be placed in a group home in [place]. He denies current suicidal ideation. He denies current homicidal ideation. Pt denies alcohol or substance use. Pt reports he is taking psychiatric medications as prescribed.  Pt is a poor historian and says he wants to sleep. Pt's chart indicates he is followed by [facility] team but Pt denies this. He reported being homeless to EDP but says during assessment he stays with his mother. Pt has a pending court date in October for trespassing.   Pt is disheveled and dressed in hospital scrubs. He is drowsy and didn't appear to want to stay awake during assessment. He is oriented to person, place and situation but not date. Speech is soft and mumbled. Motor behavior is within normal limits. Eye contact is minimal. Pt's mood is euthymic and affect is congruent with mood. Thought process is coherent. Pt says he wants to be admitted to a psychiatric hospital or group home.  Diagnosis: Schzioaffective Disorder  Past Medical History:  Past Medical History:  Diagnosis Date  . Asthma   . Bipolar disorder (HCC)   . Hypertension   . Schizophrenia [facility])     Past Surgical History:  Procedure Laterality Date  . KNEE SURGERY      Family History:  Family History  Problem Relation Age of Onset  . Family history unknown: Yes    Social History:  reports that he has been smoking Cigars.  He has a 5.00 pack-year smoking history. He has never used smokeless tobacco. He reports that he drinks alcohol. He reports that he does not use drugs.  Additional Social History:  Alcohol / Drug Use Pain Medications:  Denies Prescriptions: Denies Over the Counter: Denies History of alcohol / drug use?: No history of alcohol / drug abuse Longest period of sobriety (when/how long): NA  CIWA: CIWA-Ar BP: 117/88 Pulse Rate: 80 COWS:    PATIENT STRENGTHS: (choose at least two) Average or above average intelligence Communication skills  Allergies:  Allergies  Allergen Reactions  . Vistaril [Hydroxyzine] Nausea Only    Severe stomach pain    Home Medications:  (Not in a hospital admission)  OB/GYN Status:  No LMP for male patient.  General Assessment Data [facility] of [facility]: [facility] TTS Assessment: In system Is this a Tele or Face-to-Face Assessment?: Face-to-Face Is this an Initial Assessment or a Re-assessment for this encounter?: Initial Assessment Marital status: Single Maiden name: NA Is patient pregnant?: No Pregnancy Status: No Living Arrangements: Parent Can pt return to current living arrangement?: Yes Admission Status: Voluntary Is patient capable of signing voluntary admission?: Yes Referral Source: Self/Family/Friend Insurance type: Medicaid     Crisis Care Plan Living Arrangements: Parent Legal Guardian: Other: (Self) Name of Psychiatrist: Transport planner Name of Therapist: [facility]  Education Status Is patient currently in school?: No Current Grade: NA Highest grade of school patient has completed: 14 Name of school: [employer] person: NA  Risk to self with the past 6 months Suicidal Ideation: No Has patient been a risk to self within the past 6 months prior to admission? : No Suicidal Intent: No Has patient had any suicidal  intent within the past 6 months prior to admission? : No Is patient at risk for suicide?: No Suicidal Plan?: No Has patient had any suicidal plan within the past 6 months prior to admission? : No Access to Means: No What has been your use of drugs/alcohol within the last 12 months?: Pt denies Previous Attempts/Gestures: No How many  times?: 0 Other Self Harm Risks: None Triggers for Past Attempts: None known Intentional Self Injurious Behavior: None Family Suicide History: No Recent stressful life event(s): Conflict (Comment), Financial Problems (Conflict with family) Persecutory voices/beliefs?: Yes Depression: Yes Depression Symptoms: Fatigue, Feeling angry/irritable Substance abuse history and/or treatment for substance abuse?: No Suicide prevention information given to non-admitted patients: Not applicable  Risk to Others within the past 6 months Homicidal Ideation: No Does patient have any lifetime risk of violence toward others beyond the six months prior to admission? : No Thoughts of Harm to Others: No Comment - Thoughts of Harm to Others: None Current Homicidal Intent: No Current Homicidal Plan: No Access to Homicidal Means: No Identified Victim: None History of harm to others?: No Assessment of Violence: In past 6-12 months Violent Behavior Description: History of verbal aggression Does patient have access to weapons?: No Criminal Charges Pending?: Yes Describe Pending Criminal Charges: Trespassing Does patient have a court date: Yes Court Date: 07/18/16 Is patient on probation?: Unknown  Psychosis Hallucinations: Auditory (Pt reports voices which he cannot describe) Delusions: Persecutory  Mental Status Report Appearance/Hygiene: In scrubs, Disheveled Eye Contact: Fair Motor Activity: Unremarkable Speech: Logical/coherent, Soft Level of Consciousness: Drowsy Mood: Euthymic Affect: Appropriate to circumstance Anxiety Level: Minimal Thought Processes: Coherent Judgement: Partial Orientation: Person, Place, Situation Obsessive Compulsive Thoughts/Behaviors: None  Cognitive Functioning Concentration: Fair Memory: Recent Intact, Remote Intact IQ: Average Insight: Poor Impulse Control: Poor Appetite: Fair Weight Loss: 0 Weight Gain: 0 Sleep: No Change Total Hours of Sleep:  8 Vegetative Symptoms: None  ADLScreening [facility] Assessment Services) Patient's cognitive ability adequate to safely complete daily activities?: Yes Patient able to express need for assistance with ADLs?: Yes Independently performs ADLs?: Yes (appropriate for developmental age)  Prior Inpatient Therapy Prior Inpatient Therapy: Yes Prior Therapy Dates: 2010,2012,2013,2017 Prior Therapy Facilty/Provider(s): [facility], [facility]  Reason for Treatment: Schizoaffective Disorder  Prior Outpatient Therapy Prior Outpatient Therapy: Yes Prior Therapy Dates: Present Prior Therapy Facilty/Provider(s): [facility] Reason for Treatment: Schizoaffective Disorder Does patient have an ACCT team?: No Does patient have Intensive In-House Services?  : No Does patient have [facility] services? : Yes Does patient have P4CC services?: No  ADL Screening (condition at time of admission) Patient's cognitive ability adequate to safely complete daily activities?: Yes Is the patient deaf or have difficulty hearing?: No Does the patient have difficulty seeing, even when wearing glasses/contacts?: No Does the patient have difficulty concentrating, remembering, or making decisions?: No Patient able to express need for assistance with ADLs?: Yes Does the patient have difficulty dressing or bathing?: No Independently performs ADLs?: Yes (appropriate for developmental age) Does the patient have difficulty walking or climbing stairs?: No Weakness of Legs: None Weakness of Arms/Hands: None  Home Assistive Devices/Equipment Home Assistive Devices/Equipment: None    Abuse/Neglect Assessment (Assessment to be complete while patient is alone) Physical Abuse: Denies Verbal Abuse: Denies Sexual Abuse: Denies Exploitation of patient/patient's resources: Denies Self-Neglect: Denies     Merchant navy officer (For Healthcare) Does patient have an advance directive?: No Would patient like information on creating an advanced directive?:  No - patient declined information    Additional Information 1:1  In Past 12 Months?: Yes CIRT Risk: No Elopement Risk: No Does patient have medical clearance?: Yes     Disposition: Clint Bolderori Beck[facility], confirms acute unit is at capacity. Gave clinical report to Donell SievertSpencer Simon, PA who recommends Pt be evaluated by psychiatry this morning. Notified April Palumbo, MD of recommendation.  Disposition Initial Assessment Completed for this Encounter: Yes Disposition of Patient: Other dispositions Other disposition(s): Other (Comment)   Pamalee LeydenFord Ellis Shakeria Robinette Jr, [facility], [facility], [facility] Triage Specialist [phone]  Patsy BaltimoreWarrick Jr, Harlin RainFord Ellis 06/18/2016 4:37 AM

## 2016-06-28 ENCOUNTER — Emergency Department (HOSPITAL_COMMUNITY)
Admission: EM | Admit: 2016-06-28 | Discharge: 2016-06-29 | Disposition: A | Discharge status: Home / Self Care | Payer: Medicaid Other | Attending: Emergency Medicine | Admitting: Emergency Medicine

## 2016-06-28 ENCOUNTER — Encounter (HOSPITAL_COMMUNITY): Payer: Self-pay

## 2016-06-28 DIAGNOSIS — F17290 Nicotine dependence, other tobacco product, uncomplicated: Secondary | ICD-10-CM | POA: Diagnosis not present

## 2016-06-28 DIAGNOSIS — Z79899 Other long term (current) drug therapy: Secondary | ICD-10-CM | POA: Insufficient documentation

## 2016-06-28 DIAGNOSIS — R443 Hallucinations, unspecified: Secondary | ICD-10-CM | POA: Diagnosis not present

## 2016-06-28 DIAGNOSIS — F200 Paranoid schizophrenia: Secondary | ICD-10-CM | POA: Diagnosis present

## 2016-06-28 DIAGNOSIS — F17210 Nicotine dependence, cigarettes, uncomplicated: Secondary | ICD-10-CM | POA: Diagnosis not present

## 2016-06-28 DIAGNOSIS — F1099 Alcohol use, unspecified with unspecified alcohol-induced disorder: Secondary | ICD-10-CM | POA: Diagnosis not present

## 2016-06-28 DIAGNOSIS — J45909 Unspecified asthma, uncomplicated: Secondary | ICD-10-CM | POA: Diagnosis not present

## 2016-06-28 DIAGNOSIS — F250 Schizoaffective disorder, bipolar type: Secondary | ICD-10-CM | POA: Insufficient documentation

## 2016-06-28 LAB — CBC
HCT: 40.2 % (ref 39.0–52.0)
Hemoglobin: 13.9 g/dL (ref 13.0–17.0)
MCH: 33.4 pg (ref 26.0–34.0)
MCHC: 34.6 g/dL (ref 30.0–36.0)
MCV: 96.6 fL (ref 78.0–100.0)
PLATELETS: 166 10*3/uL (ref 150–400)
RBC: 4.16 MIL/uL — AB (ref 4.22–5.81)
RDW: 13.4 % (ref 11.5–15.5)
WBC: 7 10*3/uL (ref 4.0–10.5)

## 2016-06-28 LAB — ETHANOL

## 2016-06-28 LAB — ACETAMINOPHEN LEVEL

## 2016-06-28 LAB — COMPREHENSIVE METABOLIC PANEL
ALT: 27 U/L (ref 17–63)
ANION GAP: 6 (ref 5–15)
AST: 25 U/L (ref 15–41)
Albumin: 3.9 g/dL (ref 3.5–5.0)
Alkaline Phosphatase: 58 U/L (ref 38–126)
BILIRUBIN TOTAL: 0.2 mg/dL — AB (ref 0.3–1.2)
BUN: 20 mg/dL (ref 6–20)
CHLORIDE: 106 mmol/L (ref 101–111)
CO2: 25 mmol/L (ref 22–32)
Calcium: 8.8 mg/dL — ABNORMAL LOW (ref 8.9–10.3)
Creatinine, Ser: 0.91 mg/dL (ref 0.61–1.24)
GFR calc Af Amer: >60 mL/min (ref >60)
Glucose, Bld: 101 mg/dL — ABNORMAL HIGH (ref 65–99)
POTASSIUM: 3.5 mmol/L (ref 3.5–5.1)
Sodium: 137 mmol/L (ref 135–145)
TOTAL PROTEIN: 7 g/dL (ref 6.5–8.1)

## 2016-06-28 LAB — RAPID URINE DRUG SCREEN, HOSP PERFORMED
AMPHETAMINES: NOT DETECTED
BENZODIAZEPINES: NOT DETECTED
Barbiturates: NOT DETECTED
COCAINE: NOT DETECTED
OPIATES: NOT DETECTED
Tetrahydrocannabinol: NOT DETECTED

## 2016-06-28 LAB — SALICYLATE LEVEL

## 2016-06-28 MED ORDER — QUETIAPINE FUMARATE ER 400 MG PO TB24
800.0000 mg | ORAL_TABLET | Freq: Every day | ORAL | Status: DC
  Administered 2016-06-29: 800 mg via ORAL
  Filled 2016-06-28: qty 2

## 2016-06-28 MED ORDER — ALBUTEROL SULFATE HFA 108 (90 BASE) MCG/ACT IN AERS: 1.0000 | INHALATION_SPRAY | Freq: Four times a day (QID) | RESPIRATORY_TRACT | Status: DC | PRN

## 2016-06-28 NOTE — ED Notes (Signed)
Bed: [id number] Expected date:  Expected time:  Means of arrival:  Comments: [name]
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
## 2016-06-28 NOTE — ED Notes (Signed)
Pt has in his belongings bag a pair of grey shorts and a pair of grey pants a black shirt a grey jacket and a pair of black tennis shoes. Pt has been wanded by security.

## 2016-06-28 NOTE — ED Triage Notes (Signed)
Patient brought in by [facility]. Patient called 911 and asked to come to the ED because he is schizophrenic and has had increased PTSD. Patient has been taking meds, denies drug use. Patient is voluntary.

## 2016-06-29 DIAGNOSIS — F1099 Alcohol use, unspecified with unspecified alcohol-induced disorder: Secondary | ICD-10-CM

## 2016-06-29 DIAGNOSIS — F17290 Nicotine dependence, other tobacco product, uncomplicated: Secondary | ICD-10-CM

## 2016-06-29 DIAGNOSIS — R443 Hallucinations, unspecified: Secondary | ICD-10-CM

## 2016-06-29 DIAGNOSIS — F250 Schizoaffective disorder, bipolar type: Secondary | ICD-10-CM | POA: Diagnosis not present

## 2016-06-29 DIAGNOSIS — Z79899 Other long term (current) drug therapy: Secondary | ICD-10-CM

## 2016-06-29 NOTE — Consult Note (Signed)
[facility] Consult   Reason for Consult:  Hallucinations  Referring Physician:  EDP Patient Identification: Ryan York MRN:  [id number] Principal Diagnosis: Schizoaffective disorder, bipolar type [facility]) Diagnosis:   Patient Active Problem List   Diagnosis Date Noted  . Schizoaffective disorder, bipolar type ([place]) [F25.0] 04/20/2012    Priority: High  . Aggressive behavior [R45.89] 06/14/2016  . Tobacco use disorder [F17.200] 04/04/2016    Total Time spent with patient: 45 minutes  Subjective:   Ryan York is a 45 y.o. male patient does not warrant admission.  HPI:  45 yo male who presented with hallucinations.  He is well known to this ED and providers.  His main issue is being homeless.  He was recently at the [facility] ED telling other psychiatric patients what to say to get an admission into the psychiatric hospital.  Denies suicidal/homicidal ideations and alcohol/drug abuse.  Not responding to internal stimuli.  Stable for discharge.   Past Psychiatric History: schizoaffective disorder  Risk to Self: Suicidal Ideation: No Suicidal Intent: No Is patient at risk for suicide?: No Suicidal Plan?: No What has been your use of drugs/alcohol within the last 12 months?: none reported Triggers for Past Attempts: None known Intentional Self Injurious Behavior: None Risk to Others: Homicidal Ideation: No Thoughts of Harm to Others: No Current Homicidal Intent: No Current Homicidal Plan: No Access to Homicidal Means: No History of harm to others?: No Assessment of Violence: None Noted Does patient have access to weapons?: Yes (Comment) (pt reports he has a knife in his bag) Criminal Charges Pending?: No Describe Pending Criminal Charges: denies Does patient have a court date: No Prior Inpatient Therapy: Prior Inpatient Therapy: Yes Prior Therapy Dates: 2010,2012,2013,2017 Prior Therapy Facilty/Provider(s): [place], [facility]  Reason for Treatment: Schizoaffective  Disorder Prior Outpatient Therapy: Prior Outpatient Therapy: Yes Prior Therapy Dates: Present Prior Therapy Facilty/Provider(s): [facility] Reason for Treatment: Schizoaffective Disorder Does patient have an ACCT team?: No Does patient have Intensive In-House Services?  : No Does patient have [facility] services? : Yes Does patient have P4CC services?: No  Past Medical History:  Past Medical History:  Diagnosis Date  . Asthma   . Bipolar disorder ([place])   . Hypertension   . Schizophrenia [facility])     Past Surgical History:  Procedure Laterality Date  . KNEE SURGERY     Family History:  Family History  Problem Relation Age of Onset  . Family history unknown: Yes   Family Psychiatric  History: none Social History:  History  Alcohol Use  . Yes    Comment: Pt stated that he rarely consumes alcohol     History  Drug Use No    Comment: Pt denies     Social History   Social History  . Marital status: Single    Spouse name: N/A  . Number of children: N/A  . Years of education: N/A   Social History Main Topics  . Smoking status: Current Some Day Smoker    Packs/day: 0.50    Years: 10.00    Types: Cigars  . Smokeless tobacco: Never Used  . Alcohol use Yes     Comment: Pt stated that he rarely consumes alcohol  . Drug use: No     Comment: Pt denies   . Sexual activity: Not Asked   Other Topics Concern  . None   Social History Narrative  . None   Additional Social History:    Allergies:   Allergies  Allergen Reactions  .  Vistaril [Hydroxyzine] Nausea Only    Severe stomach pain    Labs:  Results for orders placed or performed during the hospital encounter of 06/28/16 (from the past 48 hour(s))  Comprehensive metabolic panel     Status: Abnormal   Collection Time: 06/28/16 10:36 PM  Result Value Ref Range   Sodium 137 135 - 145 mmol/L   Potassium 3.5 3.5 - 5.1 mmol/L   Chloride 106 101 - 111 mmol/L   CO2 25 22 - 32 mmol/L   Glucose, Bld 101 (H) 65 - 99  mg/dL   BUN 20 6 - 20 mg/dL   Creatinine, Ser 0.91 0.61 - 1.24 mg/dL   Calcium 8.8 (L) 8.9 - 10.3 mg/dL   Total Protein 7.0 6.5 - 8.1 g/dL   Albumin 3.9 3.5 - 5.0 g/dL   AST 25 15 - 41 U/L   ALT 27 17 - 63 U/L   Alkaline Phosphatase 58 38 - 126 U/L   Total Bilirubin 0.2 (L) 0.3 - 1.2 mg/dL   GFR calc non Af Amer >60 >60 mL/min   GFR calc Af Amer >60 >60 mL/min    Comment: (NOTE) The eGFR has been calculated using the CKD EPI equation. This calculation has not been validated in all clinical situations. eGFR's persistently <60 mL/min signify possible Chronic Kidney Disease.    Anion gap 6 5 - 15  cbc     Status: Abnormal   Collection Time: 06/28/16 10:36 PM  Result Value Ref Range   WBC 7.0 4.0 - 10.5 K/uL   RBC 4.16 (L) 4.22 - 5.81 MIL/uL   Hemoglobin 13.9 13.0 - 17.0 g/dL   HCT 40.2 39.0 - 52.0 %   MCV 96.6 78.0 - 100.0 fL   MCH 33.4 26.0 - 34.0 pg   MCHC 34.6 30.0 - 36.0 g/dL   RDW 13.4 11.5 - 15.5 %   Platelets 166 150 - 400 K/uL  Rapid urine drug screen (hospital performed)     Status: None   Collection Time: 06/28/16 10:37 PM  Result Value Ref Range   Opiates NONE DETECTED NONE DETECTED   Cocaine NONE DETECTED NONE DETECTED   Benzodiazepines NONE DETECTED NONE DETECTED   Amphetamines NONE DETECTED NONE DETECTED   Tetrahydrocannabinol NONE DETECTED NONE DETECTED   Barbiturates NONE DETECTED NONE DETECTED    Comment:        DRUG SCREEN FOR MEDICAL PURPOSES ONLY.  IF CONFIRMATION IS NEEDED FOR ANY PURPOSE, NOTIFY LAB WITHIN 5 DAYS.        LOWEST DETECTABLE LIMITS FOR URINE DRUG SCREEN Drug Class       Cutoff (ng/mL) Amphetamine      1000 Barbiturate      200 Benzodiazepine   277 Tricyclics       412 Opiates          300 Cocaine          300 THC              50   Ethanol     Status: None   Collection Time: 06/28/16 10:39 PM  Result Value Ref Range   Alcohol, Ethyl (B) <5 <5 mg/dL    Comment:        LOWEST DETECTABLE LIMIT FOR SERUM ALCOHOL IS 5  mg/dL FOR MEDICAL PURPOSES ONLY   Salicylate level     Status: None   Collection Time: 06/28/16 10:39 PM  Result Value Ref Range   Salicylate Lvl <8.7 2.8 - 30.0 mg/dL  Acetaminophen level     Status: Abnormal   Collection Time: 06/28/16 10:39 PM  Result Value Ref Range   Acetaminophen (Tylenol), Serum <10 (L) 10 - 30 ug/mL    Comment:        THERAPEUTIC CONCENTRATIONS VARY SIGNIFICANTLY. A RANGE OF 10-30 ug/mL MAY BE AN EFFECTIVE CONCENTRATION FOR MANY PATIENTS. HOWEVER, SOME ARE BEST TREATED AT CONCENTRATIONS OUTSIDE THIS RANGE. ACETAMINOPHEN CONCENTRATIONS >150 ug/mL AT 4 HOURS AFTER INGESTION AND >50 ug/mL AT 12 HOURS AFTER INGESTION ARE OFTEN ASSOCIATED WITH TOXIC REACTIONS.     Current Facility-Administered Medications  Medication Dose Route Frequency Provider Last Rate Last Dose  . albuterol (PROVENTIL HFA;VENTOLIN HFA) 108 (90 Base) MCG/ACT inhaler 1-2 puff  1-2 puff Inhalation Q6H PRN Gareth Morgan, MD      . QUEtiapine (SEROQUEL XR) 24 hr tablet 800 mg  800 mg Oral QHS Gareth Morgan, MD   800 mg at 06/29/16 0010   Current Outpatient Prescriptions  Medication Sig Dispense Refill  . albuterol (PROVENTIL HFA;VENTOLIN HFA) 108 (90 Base) MCG/ACT inhaler Inhale 1-2 puffs into the lungs every 6 (six) hours as needed for wheezing or shortness of breath.    . fluticasone (FLONASE) 50 MCG/ACT nasal spray Place 2 sprays into both nostrils daily as needed for allergies or rhinitis.    [name] ibuprofen (ADVIL,MOTRIN) 200 MG tablet Take 600 mg by mouth every 6 (six) hours as needed for moderate pain.    [name] QUEtiapine (SEROQUEL XR) 400 MG 24 hr tablet Take 2 tablets (800 mg total) by mouth at bedtime. 60 tablet 1    Musculoskeletal: Strength & Muscle Tone: within normal limits Gait & Station: normal Patient leans: N/A  Psychiatric Specialty Exam: Physical Exam  Constitutional: He is oriented to person, place, and time. He appears well-developed and well-nourished.  HENT:   Head: Normocephalic.  Neck: Normal range of motion.  Respiratory: Effort normal.  Musculoskeletal: Normal range of motion.  Neurological: He is alert and oriented to person, place, and time.  Skin: Skin is warm and dry.  Psychiatric: He has a normal mood and affect. His speech is normal and behavior is normal. Judgment and thought content normal. Cognition and memory are normal.    Review of Systems  Constitutional: Negative.   HENT: Negative.   Eyes: Negative.   Respiratory: Negative.   Cardiovascular: Negative.   Gastrointestinal: Negative.   Genitourinary: Negative.   Musculoskeletal: Negative.   Skin: Negative.   Neurological: Negative.   Endo/Heme/Allergies: Negative.   Psychiatric/Behavioral: Negative.     Blood pressure 126/63, pulse 60, temperature 98 F (36.7 C), temperature source Oral, resp. rate 17, SpO2 100 %.There is no height or weight on file to calculate BMI.  General Appearance: Dishelved   Eye Contact:  Good  Speech:  Normal Rate  Volume:  Normal  Mood:  Euthymic  Affect:  Congruent  Thought Process:  Coherent and Descriptions of Associations: Intact  Orientation:  Full (Time, Place, and Person)  Thought Content:  WDL  Suicidal Thoughts:  No, denied suicide or homicide ideation, intention or plans.  Homicidal Thoughts:  No  Memory:  Immediate;   Good Recent;   Good Remote;   Good  Judgement:  Fair  Insight:  Fair  Psychomotor Activity:  Normal  Concentration:  Concentration: Good and Attention Span: Good  Recall:  Good  Fund of Knowledge:  Fair  Language:  Good  Akathisia:  No  Handed:  Right  AIMS (if indicated):     Assets:  Leisure Time Physical Health Resilience Social Support  ADL's:  Intact  Cognition:  WNL  Sleep:        Treatment Plan Summary: Daily contact with patient to assess and evaluate symptoms and progress in treatment, Medication management and Plan schizoaffective disorder, bipolar type:  -Crisis  stabilization -Medication management:  Restarted Seroquel 800 mg at bedtime for schizoaffective disorder -Individual counseling  Disposition: No evidence of imminent risk to self or others at present.    Waylan Boga, NP 06/29/2016 9:31 AM   Patient is seen face to face for this psych evaluation, case discussed with treatment team and physician extender. Patient has chronic schizoaffective disorder without safety concerns at this time. Formulated treatment plan and reviewed the information documented and agree with the treatment plan.  Marylen Zuk 06/30/2016 11:48 AM

## 2016-06-29 NOTE — ED Provider Notes (Signed)
[facility]-EMERGENCY DEPT Provider Note   CSN: [id number] Arrival date & time: 06/28/16  2225     History   Chief Complaint Chief Complaint  Patient presents with  . Paranoid    HPI Ryan York is a 45 y.o. male.  HPI Called police to bring him in for evaluation because he wanted his schizophrenia evaluated. Pt is tangential on history. Reports that people are "floating at me" from everywher. Is on medicatlions. Denies SI/HI/hallucinations. Reports he does not feel safe at thsi time and feels he will benefit from hospitalization.   Past Medical History:  Diagnosis Date  . Asthma   . Bipolar disorder (HCC)   . Hypertension   . Schizophrenia [facility])     Patient Active Problem List   Diagnosis Date Noted  . Aggressive behavior 06/14/2016  . Tobacco use disorder 04/04/2016  . Schizoaffective disorder, bipolar type (HCC) 04/20/2012    Past Surgical History:  Procedure Laterality Date  . KNEE SURGERY         Home Medications    Prior to Admission medications   Medication Sig Start Date End Date Taking? Authorizing Provider  albuterol (PROVENTIL HFA;VENTOLIN HFA) 108 (90 Base) MCG/ACT inhaler Inhale 1-2 puffs into the lungs every 6 (six) hours as needed for wheezing or shortness of breath.    Historical Provider, MD  QUEtiapine (SEROQUEL XR) 400 MG 24 hr tablet Take 2 tablets (800 mg total) by mouth at bedtime. 04/10/16   Shari Prows, MD    Family History Family History  Problem Relation Age of Onset  . Family history unknown: Yes    Social History Social History  Substance Use Topics  . Smoking status: Current Some Day Smoker    Packs/day: 0.50    Years: 10.00    Types: Cigars  . Smokeless tobacco: Never Used  . Alcohol use Yes     Comment: Pt stated that he rarely consumes alcohol     Allergies   Vistaril [hydroxyzine]   Review of Systems Review of Systems  Constitutional: Negative for fever.  HENT: Negative for sore throat.   Eyes:  Negative for visual disturbance.  Respiratory: Negative for shortness of breath.   Cardiovascular: Negative for chest pain.  Gastrointestinal: Negative for abdominal pain.  Genitourinary: Negative for difficulty urinating.  Musculoskeletal: Negative for back pain and neck stiffness.  Skin: Negative for rash.  Neurological: Negative for syncope and headaches.     Physical Exam Updated Vital Signs BP 151/88 (BP Location: Left Arm)   Temp 98.4 F (36.9 C) (Oral)   Resp 18   SpO2 96%   Physical Exam  Constitutional: He is oriented to person, place, and time. He appears well-developed and well-nourished. No distress.  Patient has removed his purple scrubs and is laying in his bed with covers  HENT:  Head: Normocephalic and atraumatic.  Eyes: Conjunctivae and EOM are normal.  Neck: Normal range of motion.  Cardiovascular: Normal rate, regular rhythm, normal heart sounds and intact distal pulses.  Exam reveals no gallop and no friction rub.   No murmur heard. Pulmonary/Chest: Effort normal and breath sounds normal. No respiratory distress. He has no wheezes. He has no rales.  Abdominal: Soft. He exhibits no distension. There is no tenderness. There is no guarding.  Musculoskeletal: He exhibits no edema.  Neurological: He is alert and oriented to person, place, and time. GCS eye subscore is 4. GCS verbal subscore is 5. GCS motor subscore is 6.  Skin: Skin is warm  and dry. He is not diaphoretic.  Psychiatric: His speech is rapid and/or pressured and tangential. He expresses no homicidal and no suicidal ideation. He expresses no suicidal plans and no homicidal plans.  Nursing note and vitals reviewed.    ED Treatments / Results  Labs (all labs ordered are listed, but only abnormal results are displayed) Labs Reviewed  COMPREHENSIVE METABOLIC PANEL - Abnormal; Notable for the following:       Result Value   Glucose, Bld 101 (*)    Calcium 8.8 (*)    Total Bilirubin 0.2 (*)    All  other components within normal limits  ACETAMINOPHEN LEVEL - Abnormal; Notable for the following:    Acetaminophen (Tylenol), Serum <10 (*)    All other components within normal limits  CBC - Abnormal; Notable for the following:    RBC 4.16 (*)    All other components within normal limits  ETHANOL  SALICYLATE LEVEL  URINE RAPID DRUG SCREEN, HOSP PERFORMED    EKG  EKG Interpretation None       Radiology No results found.  Procedures Procedures (including critical care time)  Medications Ordered in ED Medications  QUEtiapine (SEROQUEL XR) 24 hr tablet 800 mg (800 mg Oral Given 06/29/16 0010)  albuterol (PROVENTIL HFA;VENTOLIN HFA) 108 (90 Base) MCG/ACT inhaler 1-2 puff (not administered)     Initial Impression / Assessment and Plan / ED Course  I have reviewed the triage vital signs and the nursing notes.  Pertinent labs & imaging results that were available during my care of the patient were reviewed by me and considered in my medical decision making (see chart for details).  Clinical Course   45yo male with hx of bipolar and schizophrenia [facility] concern for concern for hearing voices more frequently, and patient reports "I am a danger." Patient inappropriate during history, focused on where he is going to live, "guess I need to go to a group home" and agitated. He appears paranoid, coming out of his room yelling about people laughing when ncomments are made.  He denies SI/HI/hallucinations on my exam.  Patient is voluntary and will stay for evaluation. If he would like to leave, will need reassessment.  Ordered home meds. TTS to reevaluate in AM.  Patient medically cleared awaiting reevaluation.  Final Clinical Impressions(s) / ED Diagnoses   Final diagnoses:  Schizophrenia, unspecified type [facility](HCC)    New Prescriptions New Prescriptions   No medications on file     Alvira MondayErin Rosina Cressler, MD 06/29/16 [phone]

## 2016-06-29 NOTE — ED Notes (Signed)
Discharge instructions and belongings given to client. Patient has been discharged.

## 2016-06-29 NOTE — [facility] Suicide Risk Assessment (Signed)
Suicide Risk Assessment  Discharge Assessment   [facility]   Principal Problem: Schizoaffective disorder, bipolar type [facility](HCC) Discharge Diagnoses:  Patient Active Problem List   Diagnosis Date Noted  . Schizoaffective disorder, bipolar type (H[facility]) [F25.0] 04/20/2012    Priority: High  . Aggressive behavior [R45.89] 06/14/2016  . Tobacco use disorder [F17.200] 04/04/2016    Total Time spent with patient: 45 minutes  Musculoskeletal: Strength & Muscle Tone: within normal limits Gait & Station: normal Patient leans: N/A  Psychiatric Specialty Exam: Physical Exam  Constitutional: He is oriented to person, place, and time. He appears well-developed and well-nourished.  HENT:  Head: Normocephalic.  Neck: Normal range of motion.  Respiratory: Effort normal.  Musculoskeletal: Normal range of motion.  Neurological: He is alert and oriented to person, place, and time.  Skin: Skin is warm and dry.  Psychiatric: He has a normal mood and affect. His speech is normal and behavior is normal. Judgment and thought content normal. Cognition and memory are normal.    Review of Systems  Constitutional: Negative.   HENT: Negative.   Eyes: Negative.   Respiratory: Negative.   Cardiovascular: Negative.   Gastrointestinal: Negative.   Genitourinary: Negative.   Musculoskeletal: Negative.   Skin: Negative.   Neurological: Negative.   Endo/Heme/Allergies: Negative.   Psychiatric/Behavioral: Negative.     Blood pressure 126/63, pulse 60, temperature 98 F (36.7 C), temperature source Oral, resp. rate 17, SpO2 100 %.There is no height or weight on file to calculate BMI.  General Appearance: Dishelved   Eye Contact:  Good  Speech:  Normal Rate  Volume:  Normal  Mood:  Euthymic  Affect:  Congruent  Thought Process:  Coherent and Descriptions of Associations: Intact  Orientation:  Full (Time, Place, and Person)  Thought Content:  WDL  Suicidal Thoughts:  No   Homicidal Thoughts:  No  Memory:  Immediate;   Good Recent;   Good Remote;   Good  Judgement:  Fair  Insight:  Fair  Psychomotor Activity:  Normal  Concentration:  Concentration: Good and Attention Span: Good  Recall:  Good  Fund of Knowledge:  Fair  Language:  Good  Akathisia:  No  Handed:  Right  AIMS (if indicated):     Assets:  Leisure Time Physical Health Resilience Social Support  ADL's:  Intact  Cognition:  WNL  Sleep:       Mental Status Per Nursing Assessment::   On Admission:   hallucinations  Demographic Factors:  Male  Loss Factors: NA  Historical Factors: NA  Risk Reduction Factors:   Positive therapeutic relationship  Continued Clinical Symptoms:  None   Cognitive Features That Contribute To Risk:  None    Suicide Risk:  Minimal: No identifiable suicidal ideation.  Patients presenting with no risk factors but with morbid ruminations; may be classified as minimal risk based on the severity of the depressive symptoms    Plan Of Care/Follow-up recommendations:  Activity:  as tolerated Diet:  heart healthy diet  LORD, JAMISON, NP 06/29/2016, 9:35 AM

## 2016-06-29 NOTE — ED Notes (Signed)
SBAR given by Lenard GallowayJudy RN. Patient in room sleeping at this time.

## 2016-06-29 NOTE — ED Notes (Signed)
Pt. and belongings wanded by security 

## 2016-06-29 NOTE — ED Notes (Signed)
SBAR Report received from previous nurse. Pt received calm and visible on unit. Pt denies current SI/ HI, A/V H, depression, anxiety, and pain at this time, and is otherwise stable. Pt reminded of camera surveillance, q 15 min rounds, and rules of the milieu. Pt screened for contraband by writer, and became angry at the change in our policy stating "ya'all are stupid, I just changed my clothes and was wanded!" will continue to assess.

## 2016-07-04 ENCOUNTER — Emergency Department (HOSPITAL_COMMUNITY)
Admission: EM | Admit: 2016-07-04 | Discharge: 2016-07-04 | Disposition: A | Discharge status: Home / Self Care | Payer: Medicaid Other | Attending: Emergency Medicine | Admitting: Emergency Medicine

## 2016-07-04 ENCOUNTER — Encounter (HOSPITAL_COMMUNITY): Payer: Self-pay | Admitting: Emergency Medicine

## 2016-07-04 DIAGNOSIS — R451 Restlessness and agitation: Secondary | ICD-10-CM | POA: Diagnosis not present

## 2016-07-04 DIAGNOSIS — J45909 Unspecified asthma, uncomplicated: Secondary | ICD-10-CM | POA: Diagnosis not present

## 2016-07-04 DIAGNOSIS — R441 Visual hallucinations: Secondary | ICD-10-CM | POA: Diagnosis present

## 2016-07-04 DIAGNOSIS — F250 Schizoaffective disorder, bipolar type: Secondary | ICD-10-CM | POA: Insufficient documentation

## 2016-07-04 DIAGNOSIS — I10 Essential (primary) hypertension: Secondary | ICD-10-CM | POA: Diagnosis not present

## 2016-07-04 DIAGNOSIS — F17219 Nicotine dependence, cigarettes, with unspecified nicotine-induced disorders: Secondary | ICD-10-CM | POA: Insufficient documentation

## 2016-07-04 LAB — CBC
HEMATOCRIT: 40.6 % (ref 39.0–52.0)
HEMOGLOBIN: 14.4 g/dL (ref 13.0–17.0)
MCH: 33.5 pg (ref 26.0–34.0)
MCHC: 35.5 g/dL (ref 30.0–36.0)
MCV: 94.4 fL (ref 78.0–100.0)
Platelets: 179 10*3/uL (ref 150–400)
RBC: 4.3 MIL/uL (ref 4.22–5.81)
RDW: 13.2 % (ref 11.5–15.5)
WBC: 7 10*3/uL (ref 4.0–10.5)

## 2016-07-04 LAB — ETHANOL: Alcohol, Ethyl (B): <5 mg/dL (ref <5)

## 2016-07-04 LAB — COMPREHENSIVE METABOLIC PANEL
ALT: 26 U/L (ref 17–63)
AST: 24 U/L (ref 15–41)
Albumin: 4.1 g/dL (ref 3.5–5.0)
Alkaline Phosphatase: 62 U/L (ref 38–126)
Anion gap: 5 (ref 5–15)
BILIRUBIN TOTAL: 0.3 mg/dL (ref 0.3–1.2)
BUN: 21 mg/dL — ABNORMAL HIGH (ref 6–20)
CALCIUM: 8.9 mg/dL (ref 8.9–10.3)
CO2: 27 mmol/L (ref 22–32)
CREATININE: 1.03 mg/dL (ref 0.61–1.24)
Chloride: 106 mmol/L (ref 101–111)
GFR calc Af Amer: >60 mL/min (ref >60)
GLUCOSE: 91 mg/dL (ref 65–99)
POTASSIUM: 4.1 mmol/L (ref 3.5–5.1)
Sodium: 138 mmol/L (ref 135–145)
Total Protein: 7.3 g/dL (ref 6.5–8.1)

## 2016-07-04 LAB — ACETAMINOPHEN LEVEL

## 2016-07-04 LAB — SALICYLATE LEVEL: Salicylate Lvl: <7 mg/dL (ref 2.8–30.0)

## 2016-07-04 NOTE — ED Triage Notes (Addendum)
Pt here via EMS with complaints of auditory and tactile hallucinations. Pt states that he is currently hearing voices and he feels as if things are touching his skin. Pt is calm and cooperative at time of assessment, however he states he has thoughts of hurting "white people, all of the white people." Pt states he will not act on his thoughts because he does "not want to go to jail". Pt denies SI. Pt states that he is taking all of his medications as prescribed, but the "extra noise" with homecoming downtown is causing him extra stress

## 2016-07-04 NOTE — ED Notes (Signed)
Bed: [facility] Expected date:  Expected time:  Means of arrival:  Comments: 

## 2016-07-04 NOTE — ED Provider Notes (Signed)
[facility]-EMER[facility]Y DEPT Provider Note   CSN: [id number] Arrival date & time: 07/04/16  2227  By signing my name below, I, Ryan York, attest that this documentation has been prepared under the direction and in the presence TRW AutomotiveKelly Willaim Mode, PA-C. Electronically Signed: Linna Darnerussell York, Scribe. 07/04/2016. 11:02 PM.  History   Chief Complaint Chief Complaint  Patient presents with  . Hallucinations    The history is provided by the patient. No language interpreter was used.     HPI Comments: Ryan York is a 45 y.o. male brought in by EMS, with PMHx of bipolar disorder and schizophrenia, who presents to the Emergency Department complaining of constant, worsening, visual and auditory hallucinations for the last couple of days. Pt reports the influx of people into the area due to a local college's homecoming has exacerbated his hallucinations. Pt reports the voices he hears are "talking shit and causing problems." He reports he uses Seroquel for his mental health issues but ran out of the medication today. He reports he has had thoughts of hurting other people but states he is not going to and has no plan. Pt is agitated and states "the PA is a bitch" and "the PA is doing that white cracker shit." He notes [place]. He is requesting a place to sleep tonight so he can [place] [facility] [place] [facility] the [place]. He states that he does not want to go to [facility]. He denies SI or any other associated symptoms.   Past Medical History:  Diagnosis Date  . Asthma   . Bipolar disorder (HCC)   . Hypertension   . Schizophrenia [facility](HCC)     Patient Active Problem List   Diagnosis Date Noted  . Aggressive behavior 06/14/2016  . Tobacco use disorder 04/04/2016  . Schizoaffective disorder, bipolar type (HCC) 04/20/2012    Past Surgical History:  Procedure Laterality Date  . KNEE SURGERY         Home Medications    Prior to Admission medications     Medication Sig Start Date End Date Taking? Authorizing Provider  albuterol (PROVENTIL HFA;VENTOLIN HFA) 108 (90 Base) MCG/ACT inhaler Inhale 1-2 puffs into the lungs every 6 (six) hours as needed for wheezing or shortness of breath.    Historical Provider, MD  fluticasone (FLONASE) 50 MCG/ACT nasal spray Place 2 sprays into both nostrils daily as needed for allergies or rhinitis.    Historical Provider, MD  ibuprofen (ADVIL,MOTRIN) 200 MG tablet Take 600 mg by mouth every 6 (six) hours as needed for moderate pain.    Historical Provider, MD  QUEtiapine (SEROQUEL XR) 400 MG 24 hr tablet Take 2 tablets (800 mg total) by mouth at bedtime. 04/10/16   Shari ProwsJolanta B Pucilowska, MD    Family History Family History  Problem Relation Age of Onset  . Family history unknown: Yes    Social History Social History  Substance Use Topics  . Smoking status: Current Some Day Smoker    Packs/day: 0.50    Years: 10.00    Types: Cigars  . Smokeless tobacco: Never Used  . Alcohol use Yes     Comment: Pt stated that he rarely consumes alcohol     Allergies   Vistaril [hydroxyzine]   Review of Systems Review of Systems A complete 10 system review of systems was obtained and all systems are negative except as noted in the HPI and PMH.    Physical Exam Updated Vital Signs BP 119/68 (  BP Location: Left Arm)   Pulse 77   Temp 98 F (36.7 C) (Oral)   Resp 16   SpO2 98%   Physical Exam  Constitutional: He is oriented to person, place, and time. He appears well-developed and well-nourished. No distress.  Nontoxic appearing  HENT:  Head: Normocephalic and atraumatic.  Eyes: Conjunctivae and EOM are normal. No scleral icterus.  Neck: Normal range of motion.  Pulmonary/Chest: Effort normal. No respiratory distress.  Respirations even and unlabored  Musculoskeletal: Normal range of motion.  Neurological: He is alert and oriented to person, place, and time. He exhibits normal muscle tone. Coordination  normal.  Skin: Skin is warm and dry. No rash noted. He is not diaphoretic. No erythema. No pallor.  Psychiatric: His affect is angry. He is agitated and aggressive. He expresses no suicidal ideation. He expresses no suicidal plans and no homicidal plans.  Patient agitated and aggressive. No active AVH.   Nursing note and vitals reviewed.    ED Treatments / Results  Labs (all labs ordered are listed, but only abnormal results are displayed) Labs Reviewed  COMPREHENSIVE METABOLIC PANEL - Abnormal; Notable for the following:       Result Value   BUN 21 (*)    All other components within normal limits  ACETAMINOPHEN LEVEL - Abnormal; Notable for the following:    Acetaminophen (Tylenol), Serum <10 (*)    All other components within normal limits  ETHANOL  SALICYLATE LEVEL  CBC    EKG  EKG Interpretation None       Radiology No results found.  Procedures Procedures (including critical care time)  DIAGNOSTIC STUDIES: Oxygen Saturation is 98% on RA, normal by my interpretation.    COORDINATION OF CARE: 11:12 PM Discussed treatment plan with pt at bedside and pt agreed to plan.  Medications Ordered in ED Medications - No data to display   Initial Impression / Assessment and Plan / ED Course  I have reviewed the triage vital signs and the nursing notes.  Pertinent labs & imaging results that were available during my care of the patient were reviewed by me and considered in my medical decision making (see chart for details).  Clinical Course    45 year old male with a history of bipolar schizophrenia presents to the emergency department requesting a place to sleep this evening. He states that he has plans to take the bus to [employer]. He states that the influx of people to [facility] for [employer] has agitated his schizophrenia. He has no SI. No specific HI or plan. No reported command hallucinations. Patient increasingly agitated and angry when he was told  that we can not simply allow him to sleep in the ED overnight. This led him to start cursing at this writer, escalating to verbal aggression. I do not see indication for further emergent psychiatric management. Patient discharged with assistance of security.   Final Clinical Impressions(s) / ED Diagnoses   Final diagnoses:  Agitation  Schizoaffective disorder, bipolar type (HCC)    I personally performed the services described in this documentation, which was scribed in my presence. The recorded information has been reviewed and is accurate.    New Prescriptions Discharge Medication List as of 07/04/2016 11:13 PM       Antony Madura, PA-C 07/05/16 0050    [name], MD 07/05/16 [phone]

## 2016-07-04 NOTE — ED Notes (Signed)
This RN attempted to go through pt's discharge information. Pt became aggressive and threatening. Pt threw discharge paperwork and had to be escorted out by security

## 2016-07-14 ENCOUNTER — Encounter (HOSPITAL_COMMUNITY): Payer: Self-pay | Admitting: Emergency Medicine

## 2016-07-14 ENCOUNTER — Emergency Department (HOSPITAL_COMMUNITY)
Admission: EM | Admit: 2016-07-14 | Discharge: 2016-07-14 | Disposition: A | Discharge status: Home / Self Care | Payer: Medicaid Other | Attending: Emergency Medicine | Admitting: Emergency Medicine

## 2016-07-14 DIAGNOSIS — F17290 Nicotine dependence, other tobacco product, uncomplicated: Secondary | ICD-10-CM | POA: Insufficient documentation

## 2016-07-14 DIAGNOSIS — J45909 Unspecified asthma, uncomplicated: Secondary | ICD-10-CM | POA: Insufficient documentation

## 2016-07-14 DIAGNOSIS — R440 Auditory hallucinations: Secondary | ICD-10-CM | POA: Insufficient documentation

## 2016-07-14 DIAGNOSIS — R443 Hallucinations, unspecified: Secondary | ICD-10-CM

## 2016-07-14 DIAGNOSIS — I10 Essential (primary) hypertension: Secondary | ICD-10-CM | POA: Insufficient documentation

## 2016-07-14 LAB — CBC
HCT: 43.9 % (ref 39.0–52.0)
Hemoglobin: 16 g/dL (ref 13.0–17.0)
MCH: 33.9 pg (ref 26.0–34.0)
MCHC: 36.4 g/dL — ABNORMAL HIGH (ref 30.0–36.0)
MCV: 93 fL (ref 78.0–100.0)
PLATELETS: 171 10*3/uL (ref 150–400)
RBC: 4.72 MIL/uL (ref 4.22–5.81)
RDW: 13.1 % (ref 11.5–15.5)
WBC: 6.8 10*3/uL (ref 4.0–10.5)

## 2016-07-14 LAB — RAPID URINE DRUG SCREEN, HOSP PERFORMED
AMPHETAMINES: NOT DETECTED
BENZODIAZEPINES: NOT DETECTED
Barbiturates: NOT DETECTED
Cocaine: NOT DETECTED
OPIATES: NOT DETECTED
Tetrahydrocannabinol: NOT DETECTED

## 2016-07-14 LAB — COMPREHENSIVE METABOLIC PANEL
ALK PHOS: 63 U/L (ref 38–126)
ALT: 34 U/L (ref 17–63)
ANION GAP: 6 (ref 5–15)
AST: 32 U/L (ref 15–41)
Albumin: 4.4 g/dL (ref 3.5–5.0)
BILIRUBIN TOTAL: 0.6 mg/dL (ref 0.3–1.2)
BUN: 17 mg/dL (ref 6–20)
CALCIUM: 8.7 mg/dL — AB (ref 8.9–10.3)
CO2: 25 mmol/L (ref 22–32)
Chloride: 104 mmol/L (ref 101–111)
Creatinine, Ser: 1 mg/dL (ref 0.61–1.24)
Glucose, Bld: 107 mg/dL — ABNORMAL HIGH (ref 65–99)
Potassium: 4.1 mmol/L (ref 3.5–5.1)
SODIUM: 135 mmol/L (ref 135–145)
TOTAL PROTEIN: 8 g/dL (ref 6.5–8.1)

## 2016-07-14 LAB — ETHANOL

## 2016-07-14 NOTE — Discharge Instructions (Signed)
Follow-up with outpatient behavioral health services as previously recommended.

## 2016-07-14 NOTE — ED Notes (Signed)
Pt is refusing d/c VS or to e-sign.

## 2016-07-14 NOTE — BH Assessment (Addendum)
Assessment Note  Ryan York is an 45 y.o. male presents to [facility] voluntarily for hallucinations. Patient states that he "can't be homeless because I got haters and I need to be in a group home, I can't be on these streets." Patient states that he has heard voices since 1995 and states that he has never taken medications that has helped with the voices. Patient denies suicidal ideations. Patient denies self injurious behaviors. Patient denies homicidal ideations. Patient states that "I really need to talk to the doctor and get into the group home this time." Patient states that he hears voices that "try to get me to drink and smoke" and patient states that he drinks cigars. Patient denies that the voices command him to hurt himself or other people. Patient states that he also hears missiles coming towards him and states "sometimes I see them too, like a white light." Patient denies access to weapons or firearms. Patient denies pending charges and upcoming court dates. Patient denies active probation. Patient denies use of drugs and alcohol. Patient UDS and BAL clear at time of assessment. Patient does not appear to be responding to internal stimuli at time of the assessment.   Patient is alert and oriented x4. Patient is pleasant and cooperative during the assessment. Patient states that he went for an assessment at [facility] and states "I didn't even get through it." Patient states that he feels that [facility] asked too many questions and states that they wanted to start him on an injection and states that he did not want to start on an injection on an outpatient basis. Patient states that he is also not interested in medications that may decrease his sex drive. Patient states that he has had multiple ED visits requesting placement into a group home outside of [place] without success. Patient states that his last admission was "about five or seven years ago." Patient states that he has Seroquel that he  takes for sleep but states "that don't help the voices though." Patient states that he last took the Seroquel about two weeks ago. Patient states that he does not have any support locally but states that he speaks to his daughter often on the phone and states that he is a "good dad." Patient denies history of abuse.   Consulted with [name], NP who states that patient does not currently meet inpatient criteria. Patient recommended to follow up with [facility].   Diagnosis: Schizoaffective Disorder, Bipolar Type - per history.   Past Medical History:  Past Medical History:  Diagnosis Date  . Asthma   . Bipolar disorder (HCC)   . Hypertension   . Schizophrenia [facility])     Past Surgical History:  Procedure Laterality Date  . KNEE SURGERY      Family History:  Family History  Problem Relation Age of Onset  . Family history unknown: Yes    Social History:  reports that he has been smoking Cigars.  He has a 5.00 pack-year smoking history. He has never used smokeless tobacco. He reports that he drinks alcohol. He reports that he does not use drugs.  Additional Social History:  Alcohol / Drug Use Pain Medications: Denies Prescriptions: Denies Over the Counter: Denies History of alcohol / drug use?: No history of alcohol / drug abuse  CIWA: CIWA-Ar BP: 136/85 Pulse Rate: 71 COWS:    Allergies:  Allergies  Allergen Reactions  . Vistaril [Hydroxyzine] Nausea Only    Severe stomach pain    Home Medications:  (  Not in a hospital admission)  OB/GYN Status:  No LMP for male patient.  General Assessment Data Location of Assessment: [facility] TTS Assessment: In system Is this a Tele or Face-to-Face Assessment?: Face-to-Face Is this an Initial Assessment or a Re-assessment for this encounter?: Initial Assessment Marital status: Separated (since 1999) Is patient pregnant?: No Pregnancy Status: No Living Arrangements: Other (Comment) (homeless) Can pt return to current living  arrangement?: Yes Admission Status: Voluntary Is patient capable of signing voluntary admission?: Yes Referral Source: Self/Family/Friend Insurance type: Medicaid     Crisis Care Plan Living Arrangements: Other (Comment) (homeless) Name of Psychiatrist: None ("it didn't work out") Name of Therapist: None  Education Status Is patient currently in school?: No Highest grade of school patient has completed: "couple years in college"  Risk to self with the past 6 months Suicidal Ideation: No Has patient been a risk to self within the past 6 months prior to admission? : No Suicidal Intent: No Has patient had any suicidal intent within the past 6 months prior to admission? : No Is patient at risk for suicide?: No Suicidal Plan?: No Has patient had any suicidal plan within the past 6 months prior to admission? : No Access to Means: No What has been your use of drugs/alcohol within the last 12 months?: Denies Previous Attempts/Gestures: No How many times?: 0 Other Self Harm Risks: Denies Triggers for Past Attempts: None known Intentional Self Injurious Behavior: None Family Suicide History: No Recent stressful life event(s): Recent negative physical changes, Other (Comment) ("my knee hurt and I'm homeless") Persecutory voices/beliefs?: No Depression: Yes ("I mean kind of, it's under control") Depression Symptoms: Insomnia, Isolating, Fatigue Substance abuse history and/or treatment for substance abuse?: No Suicide prevention information given to non-admitted patients: Not applicable  Risk to Others within the past 6 months Homicidal Ideation: No Does patient have any lifetime risk of violence toward others beyond the six months prior to admission? : No Thoughts of Harm to Others: No Comment - Thoughts of Harm to Others: Denies Current Homicidal Intent: No Current Homicidal Plan: No Access to Homicidal Means: No Identified Victim: Denies History of harm to others?:  No Assessment of Violence: None Noted Violent Behavior Description: Denies Does patient have access to weapons?: No Criminal Charges Pending?: No Describe Pending Criminal Charges: Denies Does patient have a court date: No Is patient on probation?: No  Psychosis Hallucinations: Auditory, Visual (they try to get me to smoke and drink and shoot missiles) Delusions: None noted  Mental Status Report Appearance/Hygiene: In scrubs Eye Contact: Good Motor Activity: Freedom of movement Speech: Logical/coherent Level of Consciousness: Alert Mood: Pleasant Affect: Appropriate to circumstance Anxiety Level: None Thought Processes: Coherent, Relevant Judgement: Partial Orientation: Person, Place, Time, Situation, Appropriate for developmental age Obsessive Compulsive Thoughts/Behaviors: None  Cognitive Functioning Concentration: Decreased Memory: Recent Intact, Remote Intact IQ: Average Insight: Fair Impulse Control: Fair Appetite: Good Sleep: Decreased Total Hours of Sleep:  ("a cat nap") Vegetative Symptoms: None  ADLScreening [facility] Assessment Services) Patient's cognitive ability adequate to safely complete daily activities?: Yes Patient able to express need for assistance with ADLs?: Yes Independently performs ADLs?: Yes (appropriate for developmental age)  Prior Inpatient Therapy Prior Inpatient Therapy: Yes Prior Therapy Dates: 2010, 2012,2017 [facility] Reason for Treatment: Schizoaffective DIosrder  Prior Outpatient Therapy Prior Outpatient Therapy: Yes Prior Therapy Dates: [facility]  Prior Therapy Facilty/Provider(s): [facility] Reason for Treatment: Schizoaffective Disorder Does patient have an ACCT team?: No Does patient  have Intensive In-House Services?  : No Does patient have [facility] services? : No Does patient have P4CC services?: No  ADL Screening (condition at time of admission) Patient's cognitive ability  adequate to safely complete daily activities?: Yes Is the patient deaf or have difficulty hearing?: No Does the patient have difficulty seeing, even when wearing glasses/contacts?: No Does the patient have difficulty concentrating, remembering, or making decisions?: No Patient able to express need for assistance with ADLs?: Yes Does the patient have difficulty dressing or bathing?: No Independently performs ADLs?: Yes (appropriate for developmental age) Does the patient have difficulty walking or climbing stairs?: No Weakness of Legs: None Weakness of Arms/Hands: None  Home Assistive Devices/Equipment Home Assistive Devices/Equipment: Cane (specify quad or straight) (patient uses a cane but states that he lost it)  Therapy Consults (therapy consults require a physician order) PT Evaluation Needed: No OT Evalulation Needed: No SLP Evaluation Needed: No Abuse/Neglect Assessment (Assessment to be complete while patient is alone) Physical Abuse: Denies Verbal Abuse: Denies Sexual Abuse: Denies Exploitation of patient/patient's resources: Denies Self-Neglect: Denies Values / Beliefs Cultural Requests During Hospitalization: None Spiritual Requests During Hospitalization: None   Advance Directives (For Healthcare) Does patient have an advance directive?: No Would patient like information on creating an advanced directive?: No - patient declined information    Additional Information 1:1 In Past 12 Months?: No CIRT Risk: No Elopement Risk: No Does patient have medical clearance?: Yes     Disposition:  Disposition Initial Assessment Completed for this Encounter: Yes Disposition of Patient: Outpatient treatment (per [name], NP) Type of outpatient treatment: Adult  On Site Evaluation by:   Reviewed with Physician:    Nashly Olsson 07/14/2016 3:45 AM

## 2016-07-14 NOTE — ED Triage Notes (Signed)
Pt reports hearing voices and states that they are shooting missiles at him and has not been taking medication for schizophrenia and paranoia. Pt reports being homeless and wanting to get into a group home. Currently denies any SI/HI

## 2016-07-14 NOTE — ED Provider Notes (Signed)
[facility] Provider Note   CSN: [id number] Arrival date & time: 07/14/16  0008  By signing my name below, I, Majel Homer, attest that this documentation has been prepared under the direction and in the presence of Geoffery Lyons, MD . Electronically Signed: Majel Homer, Scribe. 07/14/2016. 1:23 AM.  History   Chief Complaint Chief Complaint  Patient presents with  . Psychiatric Evaluation   The history is provided by the patient. No language interpreter was used.   HPI Comments: Ryan York is a 45 y.o. male who presents to the Emergency Department for an evaluation of hearing voices. Pt reports he is hearing voices that are "threatenig him and shooting missiles at him." He states he is homeless and is "so exhausted and tired" that he cannot [facility]. He notes he wants to get into a group home but his "situation is so messed up." Pt states he is currently taking Seroquel; he notes it makes him drowsy but "does not take away the voices."   Past Medical History:  Diagnosis Date  . Asthma   . Bipolar disorder (HCC)   . Hypertension   . Schizophrenia [facility])     Patient Active Problem List   Diagnosis Date Noted  . Aggressive behavior 06/14/2016  . Tobacco use disorder 04/04/2016  . Schizoaffective disorder, bipolar type (HCC) 04/20/2012    Past Surgical History:  Procedure Laterality Date  . KNEE SURGERY         Home Medications    Prior to Admission medications   Medication Sig Start Date End Date Taking? Authorizing Provider  albuterol (PROVENTIL HFA;VENTOLIN HFA) 108 (90 Base) MCG/ACT inhaler Inhale 1-2 puffs into the lungs every 6 (six) hours as needed for wheezing or shortness of breath.   Yes Historical Provider, MD  fluticasone (FLONASE) 50 MCG/ACT nasal spray Place 2 sprays into both nostrils daily as needed for allergies or rhinitis.   Yes Historical Provider, MD  ibuprofen (ADVIL,MOTRIN) 200 MG tablet Take 600 mg by mouth every 6 (six) hours as  needed for moderate pain.   Yes Historical Provider, MD  montelukast (SINGULAIR) 10 MG tablet Take 10 mg by mouth daily. 07/02/16  Yes Historical Provider, MD  QUEtiapine (SEROQUEL XR) 400 MG 24 hr tablet Take 2 tablets (800 mg total) by mouth at bedtime. 04/10/16  Yes Shari Prows, MD    Family History Family History  Problem Relation Age of Onset  . Family history unknown: Yes    Social History Social History  Substance Use Topics  . Smoking status: Current Some Day Smoker    Packs/day: 0.50    Years: 10.00    Types: Cigars  . Smokeless tobacco: Never Used  . Alcohol use Yes     Comment: Pt stated that he rarely consumes alcohol     Allergies   Vistaril [hydroxyzine]   Review of Systems Review of Systems  Constitutional: Negative for fever.  Psychiatric/Behavioral: Positive for hallucinations.  All other systems reviewed and are negative.  Physical Exam Updated Vital Signs BP 136/85 (BP Location: Right Arm)   Pulse 71   Temp 98.1 F (36.7 C) (Oral)   Resp 18   Ht 6\' 2"  (1.88 m)   Wt 230 lb (104.3 kg)   SpO2 95%   BMI 29.53 kg/m   Physical Exam  Constitutional: He is oriented to person, place, and time. He appears well-developed and well-nourished.  HENT:  Head: Normocephalic and atraumatic.  Eyes: EOM are normal.  Neck: Normal range of motion.  Cardiovascular: Normal rate, regular rhythm, normal heart sounds and intact distal pulses.   Pulmonary/Chest: Effort normal and breath sounds normal. No respiratory distress.  Abdominal: Soft. He exhibits no distension. There is no tenderness.  Musculoskeletal: Normal range of motion.  Neurological: He is alert and oriented to person, place, and time.  Skin: Skin is warm and dry.  Psychiatric: He has a normal mood and affect. Judgment normal.  Nursing note and vitals reviewed.  ED Treatments / Results  Labs (all labs ordered are listed, but only abnormal results are displayed) Labs Reviewed    COMPREHENSIVE METABOLIC PANEL - Abnormal; Notable for the following:       Result Value   Glucose, Bld 107 (*)    Calcium 8.7 (*)    All other components within normal limits  CBC - Abnormal; Notable for the following:    MCHC 36.4 (*)    All other components within normal limits  ETHANOL  RAPID URINE DRUG SCREEN, HOSP PERFORMED    EKG  EKG Interpretation None       Radiology No results found.  Procedures Procedures (including critical care time)  Medications Ordered in ED Medications - No data to display  DIAGNOSTIC STUDIES:  Oxygen Saturation is 95% on RA, normal by my interpretation.    COORDINATION OF CARE:  1:21 AM Discussed treatment plan with pt at bedside and pt agreed to plan.  Initial Impression / Assessment and Plan / ED Course  I have reviewed the triage vital signs and the nursing notes.  Pertinent labs & imaging results that were available during my care of the patient were reviewed by me and considered in my medical decision making (see chart for details).  Clinical Course    Patient evaluated by TTS and felt to be at his psychiatric baseline. He is well known to this emergency department and TTS and they do not feel as though his behavior today differs significantly from his normal behavior. They feel as though he is appropriate for discharge. Patient will be discharged home, to return as needed.  I personally performed the services described in this documentation, which was scribed in my presence. The recorded information has been reviewed and is accurate.   Final Clinical Impressions(s) / ED Diagnoses   Final diagnoses:  None    New Prescriptions New Prescriptions   No medications on file     Geoffery Lyonsouglas Alijah Hyde, MD 07/14/16 [phone]

## 2016-07-14 NOTE — ED Notes (Signed)
Dr.Delo at bedside with pt.

## 2016-07-14 NOTE — BH Assessment (Addendum)
Assessment completed. Consulted with [name], NP who recommends patient be discharged as he does not currently meet inpatient criteria. Discussed with EP who is in agreement. [facility] Patient states that he does not like [facility] but states that he will follow up.   Davina PokeJoVea Shyheim Tanney, LCSW Therapeutic Triage Specialist [facility] 07/14/2016 2:44 AM

## 2016-07-20 ENCOUNTER — Encounter (HOSPITAL_COMMUNITY): Payer: Self-pay

## 2016-07-20 ENCOUNTER — Emergency Department (HOSPITAL_COMMUNITY)
Admission: EM | Admit: 2016-07-20 | Discharge: 2016-07-20 | Disposition: A | Discharge status: Home / Self Care | Payer: Medicaid Other | Attending: Emergency Medicine | Admitting: Emergency Medicine

## 2016-07-20 ENCOUNTER — Encounter (HOSPITAL_COMMUNITY): Payer: Self-pay | Admitting: Emergency Medicine

## 2016-07-20 DIAGNOSIS — F17290 Nicotine dependence, other tobacco product, uncomplicated: Secondary | ICD-10-CM | POA: Insufficient documentation

## 2016-07-20 DIAGNOSIS — M25562 Pain in left knee: Secondary | ICD-10-CM | POA: Insufficient documentation

## 2016-07-20 DIAGNOSIS — F250 Schizoaffective disorder, bipolar type: Secondary | ICD-10-CM

## 2016-07-20 DIAGNOSIS — Z590 Homelessness: Secondary | ICD-10-CM

## 2016-07-20 DIAGNOSIS — I10 Essential (primary) hypertension: Secondary | ICD-10-CM

## 2016-07-20 DIAGNOSIS — J45909 Unspecified asthma, uncomplicated: Secondary | ICD-10-CM | POA: Insufficient documentation

## 2016-07-20 DIAGNOSIS — Z79899 Other long term (current) drug therapy: Secondary | ICD-10-CM | POA: Insufficient documentation

## 2016-07-20 DIAGNOSIS — R440 Auditory hallucinations: Secondary | ICD-10-CM

## 2016-07-20 DIAGNOSIS — F17210 Nicotine dependence, cigarettes, uncomplicated: Secondary | ICD-10-CM | POA: Diagnosis not present

## 2016-07-20 DIAGNOSIS — F1099 Alcohol use, unspecified with unspecified alcohol-induced disorder: Secondary | ICD-10-CM

## 2016-07-20 DIAGNOSIS — M545 Low back pain: Secondary | ICD-10-CM | POA: Insufficient documentation

## 2016-07-20 DIAGNOSIS — F2089 Other schizophrenia: Secondary | ICD-10-CM

## 2016-07-20 LAB — ETHANOL

## 2016-07-20 LAB — CBC
HCT: 45.5 % (ref 39.0–52.0)
Hemoglobin: 16 g/dL (ref 13.0–17.0)
MCH: 33.5 pg (ref 26.0–34.0)
MCHC: 35.2 g/dL (ref 30.0–36.0)
MCV: 95.2 fL (ref 78.0–100.0)
Platelets: 168 10*3/uL (ref 150–400)
RBC: 4.78 MIL/uL (ref 4.22–5.81)
RDW: 12.7 % (ref 11.5–15.5)
WBC: 7 10*3/uL (ref 4.0–10.5)

## 2016-07-20 LAB — COMPREHENSIVE METABOLIC PANEL
ALK PHOS: 64 U/L (ref 38–126)
ALT: 32 U/L (ref 17–63)
AST: 27 U/L (ref 15–41)
Albumin: 4.1 g/dL (ref 3.5–5.0)
Anion gap: 6 (ref 5–15)
BILIRUBIN TOTAL: 0.6 mg/dL (ref 0.3–1.2)
BUN: 16 mg/dL (ref 6–20)
CALCIUM: 9 mg/dL (ref 8.9–10.3)
CO2: 27 mmol/L (ref 22–32)
CREATININE: 1.06 mg/dL (ref 0.61–1.24)
Chloride: 105 mmol/L (ref 101–111)
GFR calc Af Amer: >60 mL/min (ref >60)
Glucose, Bld: 104 mg/dL — ABNORMAL HIGH (ref 65–99)
Potassium: 4.2 mmol/L (ref 3.5–5.1)
Sodium: 138 mmol/L (ref 135–145)
Total Protein: 7.2 g/dL (ref 6.5–8.1)

## 2016-07-20 LAB — RAPID URINE DRUG SCREEN, HOSP PERFORMED
Amphetamines: NOT DETECTED
BARBITURATES: NOT DETECTED
Benzodiazepines: NOT DETECTED
COCAINE: NOT DETECTED
OPIATES: NOT DETECTED
Tetrahydrocannabinol: NOT DETECTED

## 2016-07-20 MED ORDER — ACETAMINOPHEN 500 MG PO TABS
1000.0000 mg | ORAL_TABLET | Freq: Once | ORAL | Status: AC
  Administered 2016-07-20: 1000 mg via ORAL
  Filled 2016-07-20: qty 2

## 2016-07-20 MED ORDER — ACETAMINOPHEN 325 MG PO TABS
650.0000 mg | ORAL_TABLET | ORAL | Status: DC | PRN
  Administered 2016-07-20: 650 mg via ORAL
  Filled 2016-07-20: qty 2

## 2016-07-20 NOTE — ED Notes (Signed)
Tylenol given for c/o headache.

## 2016-07-20 NOTE — ED Triage Notes (Signed)
Pt presents to ED with complaints of hearing voices, non-command, but negative in nature.  Pt denies SI/HI.  Pt calm and cooperative in triage.  Pt also c/o back pain.  Denies injury.  Ambulatory at triage.

## 2016-07-20 NOTE — ED Notes (Signed)
Pt lying on stretcher. Pillow given for comfort. Pt aware will be re-evaluated this am.

## 2016-07-20 NOTE — ED Notes (Signed)
Telepsych being performed. 

## 2016-07-20 NOTE — ED Notes (Signed)
Pt lying on stretcher w/tv on - no sound. States does not want any sound on. Lights noted to be off in room. Pt does not appear to be responding to any internal stimuli - no self-injurious behaviors noted.

## 2016-07-20 NOTE — ED Provider Notes (Signed)
[facility]-EMERGENCY DEPT Provider Note   CSN: [id number] Arrival date & time: 07/20/16  0216  By signing my name below, I, Emmanuella Mensah, attest that this documentation has been prepared under the direction and in the presence of Shon Baton, MD. Electronically Signed: Angelene Giovanni, ED Scribe. 07/20/16. 3:11 AM.   History   Chief Complaint Chief Complaint  Patient presents with  . Hallucinations  . Back Pain    HPI Comments: Ryan York is a 45 y.o. male with a hx of bipolar disorder, schizophrenia, and hypertension who presents to the Emergency Department requesting evaluation for persistent moderate auditory hallucinations. He explains that he hears "voices that talk trash all the time" and they "shoot lasers in his brain". He adds that he has been having these symptoms for a while now but they are worsening, especially with the "shooting". No alleviating factors noted. Pt has not tried any medications for these symptoms. He reports that he has been complaint with his Seroquel for his schizophrenia and denies any new medications. Pt has an allergy to Vistaril. He has been evaluated several times in the ED for these symptoms (last visit was 07/14/16). He denies any fever, SI/HI, confusion, or any other symptoms.   He also c/o back pain s/p playing basketball yesterday but states that that is not the reason he came to the ED. No injuries sustained.   The history is provided by the patient. No language interpreter was used.    Past Medical History:  Diagnosis Date  . Asthma   . Bipolar disorder (HCC)   . Hypertension   . Schizophrenia [facility])     Patient Active Problem List   Diagnosis Date Noted  . Aggressive behavior 06/14/2016  . Tobacco use disorder 04/04/2016  . Schizoaffective disorder, bipolar type (HCC) 04/20/2012    Past Surgical History:  Procedure Laterality Date  . KNEE SURGERY         Home Medications    Prior to Admission medications     Medication Sig Start Date End Date Taking? Authorizing Provider  montelukast (SINGULAIR) 10 MG tablet Take 10 mg by mouth daily. 07/02/16  Yes Historical Provider, MD  QUEtiapine (SEROQUEL XR) 400 MG 24 hr tablet Take 2 tablets (800 mg total) by mouth at bedtime. 04/10/16  Yes Shari Prows, MD    Family History Family History  Problem Relation Age of Onset  . Family history unknown: Yes    Social History Social History  Substance Use Topics  . Smoking status: Current Some Day Smoker    Packs/day: 0.50    Years: 10.00    Types: Cigars  . Smokeless tobacco: Never Used  . Alcohol use Yes     Comment: Pt stated that he rarely consumes alcohol     Allergies   Vistaril [hydroxyzine]   Review of Systems Review of Systems  Constitutional: Negative for fever.  Respiratory: Negative for shortness of breath.   Cardiovascular: Negative for chest pain.  Musculoskeletal:       Back pain  Psychiatric/Behavioral: Positive for hallucinations. Negative for self-injury and suicidal ideas.  All other systems reviewed and are negative.    Physical Exam Updated Vital Signs BP 130/87 (BP Location: Right Arm)   Pulse 66   Temp 97.6 F (36.4 C) (Oral)   Resp 18   SpO2 98%   Physical Exam  Constitutional: He is oriented to person, place, and time. He appears well-developed and well-nourished.  HENT:  Head: Normocephalic and atraumatic.  Multiple gold teeth  Eyes: Pupils are equal, round, and reactive to light.  Cardiovascular: Normal rate, regular rhythm and normal heart sounds.   No murmur heard. Pulmonary/Chest: Effort normal and breath sounds normal. No respiratory distress. He has no wheezes.  Abdominal: Soft. There is no tenderness.  Musculoskeletal: He exhibits edema.  Neurological: He is alert and oriented to person, place, and time.  Skin: Skin is warm and dry.  Psychiatric: He has a normal mood and affect.  Excitable but cooperative  Nursing note and vitals  reviewed.    ED Treatments / Results  DIAGNOSTIC STUDIES: Oxygen Saturation is 98% on RA, normal by my interpretation.    COORDINATION OF CARE: 3:07 AM- Pt advised of plan for treatment and pt agrees. He will receive lab work for further evaluation. He will also receive Tylenol.    Labs (all labs ordered are listed, but only abnormal results are displayed) Labs Reviewed  COMPREHENSIVE METABOLIC PANEL - Abnormal; Notable for the following:       Result Value   Glucose, Bld 104 (*)    All other components within normal limits  ETHANOL  CBC  RAPID URINE DRUG SCREEN, HOSP PERFORMED    EKG  EKG Interpretation None       Radiology No results found.  Procedures Procedures (including critical care time)  Medications Ordered in ED Medications  acetaminophen (TYLENOL) tablet 1,000 mg (not administered)     Initial Impression / Assessment and Plan / ED Course  Shon Batonourtney F Horton, MD has reviewed the triage vital signs and the nursing notes.  Pertinent labs & imaging results that were available during my care of the patient were reviewed by me and considered in my medical decision making (see chart for details).  Clinical Course    Patient presents with worsening auditory hallucinations. Denies SI or HI. He is cooperative. Lab work is largely reassuring. He is medically cleared. Will have TTS evaluate.  Final Clinical Impressions(s) / ED Diagnoses   Final diagnoses:  Auditory hallucinations  Other schizophrenia (HCC)    New Prescriptions New Prescriptions   No medications on file   I personally performed the services described in this documentation, which was scribed in my presence. The recorded information has been reviewed and is accurate.    Shon Batonourtney F Horton, MD 07/20/16 [phone]

## 2016-07-20 NOTE — ED Notes (Signed)
Pt in room alone, can be heard from nurses station responding to internal stimuli. Pt calm & cooperative, denies SI/HI. Resting comfortably in bed

## 2016-07-20 NOTE — ED Provider Notes (Signed)
Behavioral health has determined that the patient is stable for discharge. I have evaluated the patient. He is alert and nontoxic. He reports he is feeling improved. He does have follow-up planned for tomorrow. He voices understanding.  On examination patient is nontoxic. He has no respiratory distress. He follows commands without difficulty. Heart and lung exams are normal. No peripheral edema. He was are coordinated purposeful symmetric.  Plan will be for discharge and follow-up as planned tomorrow with behavioral health.   Arby BarretteMarcy Lamone Ferrelli, MD 07/20/16 1113

## 2016-07-20 NOTE — BH Assessment (Signed)
Tele Assessment Note   Ryan York is an 45 y.o. male who presents to the ED voluntarily. Pt reports he has been hearing voices that are "shooting lazers in his brain"  And "talking shit." Pt reports he has been hearing voices since 1995 however the "shooting just started about 1 month ago." Pt denies S/I however he reports the voices make threats to him and tell him if he does not "kidnap people or use drugs they are going to hurt me." Pt stated "they be trying to scare me saying they going to kill my family and all types of shit." Pt denies H/I however he reports that he has thought of hurting others in the past due to the voices giving him constant frustration and other people "bothering him" while he is dealing with the voices so the pt reports he becomes agitated and has thoughts of wanting to kill those people. Pt endorses paranoia as he stated "I think they may have some connection or hooked up to the voices and they trying to mess with me and I want to do them in."   During the assessment, the pt appeared to be responding to internal stimuli as he would pause, look around, and laugh while the assessor was asking about his mental health history and recent stressors and life changes. Pt reports he recently lost his housing he was expected to obtain because he is homeless and has been "drifting". Pt reports the person who was supposed to handle the paperwork in order for him to get housing did not follow up which caused him to lose his housing opportunity. Pt reports he is not currently in compliance with his medication and states he has an appointment with [facility] on Monday. Pt reports he [facility] but the pt was unable to call his former therapist name and reports he last saw her "about 3 months ago." Pt reports he has been hospitalized "15 or 20 times" for mental health reasons in the past.   Ryan ConnJason Berry, FNP recommends AM psych eval. Pertetue, RN has  been notified. Pt expressed need for a care coordinator due to lack of resources and support for mental health issues.   Diagnosis: Schizoaffective Disorder   Past Medical History:  Past Medical History:  Diagnosis Date   Asthma    Bipolar disorder (HCC)    Hypertension    Schizophrenia (HCC)     Past Surgical History:  Procedure Laterality Date   KNEE SURGERY      Family History:  Family History  Problem Relation Age of Onset   Family history unknown: Yes    Social History:  reports that he has been smoking Cigars.  He has a 5.00 pack-year smoking history. He has never used smokeless tobacco. He reports that he drinks alcohol. He reports that he does not use drugs.  Additional Social History:  Alcohol / Drug Use Pain Medications: Pt denies abuse  Prescriptions: Pt denies abuse  Over the Counter: Pt denies abuse  History of alcohol / drug use?: Yes Substance #1 Name of Substance 1: Alcohol 1 - Age of First Use: 14 1 - Amount (size/oz): 1 pint 1 - Frequency: "I don't have any money to get another bottle." 1 - Duration: "off and on" 1 - Last Use / Amount: last weekend  CIWA: CIWA-Ar BP: 130/87 Pulse Rate: 66 COWS:    PATIENT STRENGTHS: (choose at least two) Communication skills Motivation for treatment/growth Physical  Health  Allergies:  Allergies  Allergen Reactions   Vistaril [Hydroxyzine] Nausea Only    Severe stomach pain    Home Medications:  (Not in a hospital admission)  OB/GYN Status:  No LMP for male patient.  General Assessment Data Location of Assessment: [facility] ED TTS Assessment: In system Is this a Tele or Face-to-Face Assessment?: Tele Assessment Is this an Initial Assessment or a Re-assessment for this encounter?: Initial Assessment Marital status: Single Is patient pregnant?: No Pregnancy Status: No Living Arrangements: Other (Comment) (homeless) Can pt return to current living arrangement?: Yes Admission Status: Voluntary Is  patient capable of signing voluntary admission?: Yes Referral Source: Self/Family/Friend Insurance type: Medicaid     Crisis Care Plan Living Arrangements: Other (Comment) (homeless) Name of Psychiatrist: none Name of Therapist: Transport planner  Education Status Is patient currently in school?: No Highest grade of school patient has completed: some college  Risk to self with the past 6 months Suicidal Ideation: No Has patient been a risk to self within the past 6 months prior to admission? : No Suicidal Intent: No Has patient had any suicidal intent within the past 6 months prior to admission? : No Is patient at risk for suicide?: No Suicidal Plan?: No Has patient had any suicidal plan within the past 6 months prior to admission? : No Access to Means: No What has been your use of drugs/alcohol within the last 12 months?: reports to occasional alcohol use  Previous Attempts/Gestures: No Triggers for Past Attempts: Hallucinations Intentional Self Injurious Behavior: None Family Suicide History: No Recent stressful life event(s): Loss (Comment) (pt reports his housing fell through and still homeless) Persecutory voices/beliefs?: Yes Depression: Yes Depression Symptoms: Insomnia, Isolating, Feeling angry/irritable Substance abuse history and/or treatment for substance abuse?: No Suicide prevention information given to non-admitted patients: Not applicable  Risk to Others within the past 6 months Homicidal Ideation: No-Not Currently/Within Last 6 Months Does patient have any lifetime risk of violence toward others beyond the six months prior to admission? : Yes (comment) (pt reports voices tell him to hurt others and threaten him) Thoughts of Harm to Others: No-Not Currently Present/Within Last 6 Months Comment - Thoughts of Harm to Others: pt reports he constantly hears voices and they "talk shit to him" and the voices tell him to "kidnap people" Current Homicidal Intent: No-Not  Currently/Within Last 6 Months Current Homicidal Plan: No Access to Homicidal Means: Yes Describe Access to Homicidal Means: pt reports he has access to knives History of harm to others?: No Assessment of Violence: None Noted Does patient have access to weapons?: Yes (Comment) (pt reports he has knife in his belongings in ED ) Criminal Charges Pending?: No Does patient have a court date: No Is patient on probation?: No  Psychosis Hallucinations: With command, Auditory Delusions: Unspecified  Mental Status Report Appearance/Hygiene: Disheveled, In scrubs, Bizarre Eye Contact: Fair Motor Activity: Freedom of movement, Rigidity Speech: Logical/coherent Level of Consciousness: Alert Mood: Preoccupied Affect: Preoccupied Anxiety Level: None Thought Processes: Tangential Judgement: Impaired Orientation: Person, Place Obsessive Compulsive Thoughts/Behaviors: None  Cognitive Functioning Concentration: Fair Memory: Remote Impaired, Recent Impaired IQ: Average Insight: Poor Impulse Control: Fair Appetite: Good Sleep: Decreased Total Hours of Sleep: 5 Vegetative Symptoms: None  ADLScreening [facility] Assessment Services) Patient's cognitive ability adequate to safely complete daily activities?: Yes Patient able to express need for assistance with ADLs?: Yes Independently performs ADLs?: Yes (appropriate for developmental age)  Prior Inpatient Therapy Prior Inpatient Therapy: Yes Prior Therapy Dates: 2010, 2012,2017 Prior Therapy  Facilty/Provider(s): [facility] Reason for Treatment: Schizoaffective DIosrder  Prior Outpatient Therapy Prior Outpatient Therapy: Yes Prior Therapy Dates: current Prior Therapy Facilty/Provider(s): [facility] Reason for Treatment: Schizoaffective Disorder Does patient have an ACCT team?: No Does patient have Intensive In-House Services?  : No Does patient have [facility] services? : Yes Does patient have P4CC services?: No  ADL Screening (condition at  time of admission) Patient's cognitive ability adequate to safely complete daily activities?: Yes Is the patient deaf or have difficulty hearing?: No Does the patient have difficulty seeing, even when wearing glasses/contacts?: No Does the patient have difficulty concentrating, remembering, or making decisions?: No Patient able to express need for assistance with ADLs?: Yes Does the patient have difficulty dressing or bathing?: No Independently performs ADLs?: Yes (appropriate for developmental age) Does the patient have difficulty walking or climbing stairs?: No Weakness of Legs: None Weakness of Arms/Hands: None  Home Assistive Devices/Equipment Home Assistive Devices/Equipment: None    Abuse/Neglect Assessment (Assessment to be complete while patient is alone) Physical Abuse: Denies Verbal Abuse: Denies Sexual Abuse: Denies Exploitation of patient/patient's resources: Denies Self-Neglect: Denies     Merchant navy officer (For Healthcare) Does patient have an advance directive?: No Would patient like information on creating an advanced directive?: No - patient declined information    Additional Information 1:1 In Past 12 Months?: No CIRT Risk: No Elopement Risk: No Does patient have medical clearance?: Yes     Disposition:  Disposition Initial Assessment Completed for this Encounter: Yes Disposition of Patient: Other dispositions Other disposition(s): Other (Comment) (AM psych eval per Ryan Conn, FNP )  Karolee Ohs 07/20/2016 3:52 AM

## 2016-07-20 NOTE — ED Notes (Signed)
TTS cart placed in room for Telepsych.

## 2016-07-20 NOTE — ED Triage Notes (Signed)
Pt complaining of L knee and back pain. Pt states is homeless, unable to lay down due to pain. Pt denies any injury/trauma.

## 2016-07-20 NOTE — Consult Note (Signed)
[facility] Tele-Psychiatry Consult   Reason for Consult:  Hallucinations  Referring Physician:  EDP Patient Identification: Ryan York MRN:  [id number] Principal Diagnosis: Schizoaffective disorder, bipolar type [facility]) Diagnosis:   Patient Active Problem List   Diagnosis Date Noted  . Aggressive behavior [R45.89] 06/14/2016  . Tobacco use disorder [F17.200] 04/04/2016  . Schizoaffective disorder, bipolar type ([place]) [F25.0] 04/20/2012    Total Time spent with patient: 30 minutes  Subjective:   Ryan York is a 45 y.o. male patient who does not warrant admission.  HPI:  Ryan York is a 45 yo male who presented with hallucinations to [facility].  He is well known to ED and providers.  His main issue is being homeless. He was recently seen for face to face consult on 06/29/2016 with Dr. Louretta Shorten. At that time he was discharged with instructions to restart his seroquel, which he has a history of not being compliant with. Denies suicidal/homicidal ideations and alcohol/drug abuse.  Not responding to internal stimuli. He reports recent voices that "sound like a pop in my brain." Patient admits to not taking his seroquel for unspecified amount of time stating "I'll be honest I was not sure it was working so I quit taking it. I'm still waiting for a medicine to help my schizophrenia. I have been dealing with this mental illness for a long time." Omarian reports that he has a follow up appointment at [facility] tomorrow and was encouraged to discuss his medication concerns with Provider. The patient was calm and pleasant during the assessment today. Patient is stable for discharge. According to notes from nursing staff the patient was not observed to be responding to any internal stimuli as he reported while in the ED. This morning the patient was observed to be quietly watching TV.  Chart review indicates that patient was last inpatient at [facility] in July 2017 and was discharged on seroquel at that time  in stable condition with outpatient follow up in place.   Past Psychiatric History: schizoaffective disorder  Risk to Self: Suicidal Ideation: No Suicidal Intent: No Is patient at risk for suicide?: No Suicidal Plan?: No Access to Means: No What has been your use of drugs/alcohol within the last 12 months?: reports to occasional alcohol use  Triggers for Past Attempts: Hallucinations Intentional Self Injurious Behavior: None Risk to Others: Homicidal Ideation: No-Not Currently/Within Last 6 Months Thoughts of Harm to Others: No-Not Currently Present/Within Last 6 Months Comment - Thoughts of Harm to Others: pt reports he constantly hears voices and they "talk shit to him" and the voices tell him to "kidnap people" but is not observed to be acutely experiencing any psychotic process while in the ED Current Homicidal Intent: No-Not Currently/Within Last 6 Months Current Homicidal Plan: No Access to Homicidal Means: Yes Describe Access to Homicidal Means: pt reports he has access to knives History of harm to others?: No Assessment of Violence: None Noted Does patient have access to weapons?: Yes (Comment) (pt reports he has knife in his belongings in ED ) However, per assessment 07/20/2016 patient denies any intent to harm others.  Criminal Charges Pending?: No Does patient have a court date: No Prior Inpatient Therapy: Prior Inpatient Therapy: Yes Prior Therapy Dates: 2010, 2012,2017 Prior Therapy Facilty/Provider(s): [facility] Reason for Treatment: Schizoaffective DIosrder Prior Outpatient Therapy: Prior Outpatient Therapy: Yes Prior Therapy Dates: current Prior Therapy Facilty/Provider(s): [facility] Reason for Treatment: Schizoaffective Disorder Does patient have an ACCT team?: No Does patient have Intensive In-House Services?  :  No Does patient have [facility] services? : Yes Does patient have P4CC services?: No  Past Medical History:  Past Medical History:  Diagnosis Date  .  Asthma   . Bipolar disorder ([place])   . Hypertension   . Schizophrenia [facility])     Past Surgical History:  Procedure Laterality Date  . KNEE SURGERY     Family History:  Family History  Problem Relation Age of Onset  . Family history unknown: Yes   Family Psychiatric  History: none Social History:  History  Alcohol Use  . Yes    Comment: Pt stated that he rarely consumes alcohol     History  Drug Use No    Comment: Pt denies     Social History   Social History  . Marital status: Single    Spouse name: N/A  . Number of children: N/A  . Years of education: N/A   Social History Main Topics  . Smoking status: Current Some Day Smoker    Packs/day: 0.50    Years: 10.00    Types: Cigars  . Smokeless tobacco: Never Used  . Alcohol use Yes     Comment: Pt stated that he rarely consumes alcohol  . Drug use: No     Comment: Pt denies   . Sexual activity: Not Asked   Other Topics Concern  . None   Social History Narrative  . None   Additional Social History:    Allergies:   Allergies  Allergen Reactions  . Vistaril [Hydroxyzine] Nausea Only    Severe stomach pain    Labs:  Results for orders placed or performed during the hospital encounter of 07/20/16 (from the past 48 hour(s))  Comprehensive metabolic panel     Status: Abnormal   Collection Time: 07/20/16  2:28 AM  Result Value Ref Range   Sodium 138 135 - 145 mmol/L   Potassium 4.2 3.5 - 5.1 mmol/L   Chloride 105 101 - 111 mmol/L   CO2 27 22 - 32 mmol/L   Glucose, Bld 104 (H) 65 - 99 mg/dL   BUN 16 6 - 20 mg/dL   Creatinine, Ser 1.06 0.61 - 1.24 mg/dL   Calcium 9.0 8.9 - 10.3 mg/dL   Total Protein 7.2 6.5 - 8.1 g/dL   Albumin 4.1 3.5 - 5.0 g/dL   AST 27 15 - 41 U/L   ALT 32 17 - 63 U/L   Alkaline Phosphatase 64 38 - 126 U/L   Total Bilirubin 0.6 0.3 - 1.2 mg/dL   GFR calc non Af Amer >60 >60 mL/min   GFR calc Af Amer >60 >60 mL/min    Comment: (NOTE) The eGFR has been calculated using the CKD EPI  equation. This calculation has not been validated in all clinical situations. eGFR's persistently <60 mL/min signify possible Chronic Kidney Disease.    Anion gap 6 5 - 15  Ethanol     Status: None   Collection Time: 07/20/16  2:28 AM  Result Value Ref Range   Alcohol, Ethyl (B) <5 <5 mg/dL    Comment:        LOWEST DETECTABLE LIMIT FOR SERUM ALCOHOL IS 5 mg/dL FOR MEDICAL PURPOSES ONLY   cbc     Status: None   Collection Time: 07/20/16  2:28 AM  Result Value Ref Range   WBC 7.0 4.0 - 10.5 K/uL   RBC 4.78 4.22 - 5.81 MIL/uL   Hemoglobin 16.0 13.0 - 17.0 g/dL   HCT 45.5 39.0 -  52.0 %   MCV 95.2 78.0 - 100.0 fL   MCH 33.5 26.0 - 34.0 pg   MCHC 35.2 30.0 - 36.0 g/dL   RDW 12.7 11.5 - 15.5 %   Platelets 168 150 - 400 K/uL  Rapid urine drug screen (hospital performed)     Status: None   Collection Time: 07/20/16  2:30 AM  Result Value Ref Range   Opiates NONE DETECTED NONE DETECTED   Cocaine NONE DETECTED NONE DETECTED   Benzodiazepines NONE DETECTED NONE DETECTED   Amphetamines NONE DETECTED NONE DETECTED   Tetrahydrocannabinol NONE DETECTED NONE DETECTED   Barbiturates NONE DETECTED NONE DETECTED    Comment:        DRUG SCREEN FOR MEDICAL PURPOSES ONLY.  IF CONFIRMATION IS NEEDED FOR ANY PURPOSE, NOTIFY LAB WITHIN 5 DAYS.        LOWEST DETECTABLE LIMITS FOR URINE DRUG SCREEN Drug Class       Cutoff (ng/mL) Amphetamine      1000 Barbiturate      200 Benzodiazepine   962 Tricyclics       229 Opiates          300 Cocaine          300 THC              50     Current Facility-Administered Medications  Medication Dose Route Frequency Provider Last Rate Last Dose  . acetaminophen (TYLENOL) tablet 650 mg  650 mg Oral Q4H PRN Charlesetta Shanks, MD   650 mg at 07/20/16 7989   Current Outpatient Prescriptions  Medication Sig Dispense Refill  . montelukast (SINGULAIR) 10 MG tablet Take 10 mg by mouth daily.  0  . QUEtiapine (SEROQUEL XR) 400 MG 24 hr tablet Take 2 tablets  (800 mg total) by mouth at bedtime. 60 tablet 1    Musculoskeletal:  Unable to assess via camera   Psychiatric Specialty Exam: Physical Exam    Review of Systems  Constitutional: Negative.   HENT: Negative.   Eyes: Negative.   Respiratory: Negative.   Cardiovascular: Negative.   Gastrointestinal: Negative.   Genitourinary: Negative.   Musculoskeletal: Negative.   Skin: Negative.   Neurological: Negative.   Endo/Heme/Allergies: Negative.   Psychiatric/Behavioral: Positive for hallucinations (Chronic stable for discharge from ED). Negative for depression, memory loss, substance abuse and suicidal ideas. The patient is not nervous/anxious and does not have insomnia.     Blood pressure 117/79, pulse 63, temperature 97.6 F (36.4 C), temperature source Oral, resp. rate 14, SpO2 96 %.There is no height or weight on file to calculate BMI.  General Appearance: Casual   Eye Contact:  Good  Speech:  Normal Rate  Volume:  Normal  Mood:  Euthymic  Affect:  Congruent  Thought Process:  Coherent and Descriptions of Associations: Intact  Orientation:  Full (Time, Place, and Person)  Thought Content:  WDL  Suicidal Thoughts:  No, denied suicide or homicide ideation, intention or plans.  Homicidal Thoughts:  No  Memory:  Immediate;   Good Recent;   Good Remote;   Good  Judgement:  Fair  Insight:  Fair  Psychomotor Activity:  Normal  Concentration:  Concentration: Good and Attention Span: Good  Recall:  Good  Fund of Knowledge:  Fair  Language:  Good  Akathisia:  No  Handed:  Right  AIMS (if indicated):     Assets:  Communication Skills Desire for Improvement Financial Resources/Insurance Leisure Time Physical Health Resilience Social Support  ADL's:  Intact  Cognition:  WNL  Sleep:        Treatment Plan Summary: Medication management at [facility] and Plan schizoaffective disorder, bipolar type: Chronic and stable for discharge from the ED at this time. No acute signs of  psychosis present during today's assessment 07/20/2016   -Medication management: Patient to continue Seroquel 800 mg at bedtime for schizoaffective disorder. He agrees to discuss concern about medication effectiveness with outpatient provider at [facility] tomorrow.  -Individual counseling  Disposition: No evidence of imminent risk to self or others at present.   Patient does not meet criteria for psychiatric inpatient admission.  Elmarie Shiley, NP 07/20/2016 10:03 AM  Case reviewed and agree with plan.

## 2016-07-20 NOTE — ED Notes (Signed)
Pt provided bag lunch & po fluids.

## 2016-07-20 NOTE — ED Notes (Signed)
Graham crackers and peanut butter given for snack as requested w/water.

## 2016-07-20 NOTE — ED Notes (Signed)
[facility] and said plan of care is for pt to get AM psych eval.

## 2016-07-20 NOTE — ED Notes (Addendum)
[place] not inventoried - 3 labeled belongings bags and black backpack.

## 2016-07-21 ENCOUNTER — Emergency Department (HOSPITAL_COMMUNITY): Payer: Medicaid Other

## 2016-07-21 ENCOUNTER — Emergency Department (HOSPITAL_COMMUNITY)
Admission: EM | Admit: 2016-07-21 | Discharge: 2016-07-21 | Disposition: A | Discharge status: Home / Self Care | Payer: Medicaid Other | Source: Home / Self Care | Attending: Emergency Medicine | Admitting: Emergency Medicine

## 2016-07-21 DIAGNOSIS — M25562 Pain in left knee: Secondary | ICD-10-CM

## 2016-07-21 DIAGNOSIS — Z5900 Homelessness unspecified: Secondary | ICD-10-CM

## 2016-07-21 DIAGNOSIS — M545 Low back pain, unspecified: Secondary | ICD-10-CM

## 2016-07-21 MED ORDER — NAPROXEN 500 MG PO TABS: 500.0000 mg | ORAL_TABLET | Freq: Two times a day (BID) | ORAL | 0 refills | Status: DC

## 2016-07-21 MED ORDER — KETOROLAC TROMETHAMINE 60 MG/2ML IM SOLN
60.0000 mg | Freq: Once | INTRAMUSCULAR | Status: AC
  Administered 2016-07-21: 60 mg via INTRAMUSCULAR
  Filled 2016-07-21: qty 2

## 2016-07-21 MED ORDER — CYCLOBENZAPRINE HCL 10 MG PO TABS: 10.0000 mg | ORAL_TABLET | Freq: Three times a day (TID) | ORAL | 0 refills | Status: DC | PRN

## 2016-07-21 MED ORDER — CYCLOBENZAPRINE HCL 10 MG PO TABS
10.0000 mg | ORAL_TABLET | Freq: Once | ORAL | Status: AC
  Administered 2016-07-21: 10 mg via ORAL
  Filled 2016-07-21: qty 1

## 2016-07-21 NOTE — ED Notes (Addendum)
Pt reporting "my back is fucked up, spasming and shit. Im homeless so I dont really have a place to lay down and my left knee hurts." pt currently lying prone. Pt denies injury at this time.

## 2016-07-21 NOTE — ED Provider Notes (Signed)
[facility]-EMERGENCY DEPT Provider Note   CSN: [id number] Arrival date & time: 07/20/16  2028  By signing my name below, I, Doreatha MartinEva Mathews, attest that this documentation has been prepared under the direction and in the presence of Dione Boozeavid Aretha Levi, MD. Electronically Signed: Doreatha MartinEva Mathews, ED Scribe. 07/21/16. 2:03 AM.     History   Chief Complaint Chief Complaint  Patient presents with  . Back Pain    HPI Ryan York is a 45 y.o. male with h/o homelessness who presents to the Emergency Department complaining of moderate, gradually worsening lower back pain for 3 days. He denies trauma, injury, falls, or heavy lifting, but notes that he believes his pain began as a result of sleeping on the concrete. Pt states his back pain pain is worsened in certain positions, especially when lying down. Pt also complains of left knee pain at this time. He states his knee pain is worsened with weight bearing and ambulation. He states he has tried topical analgesics and Tylenol with no relief of pain. He denies numbness, focal weakness, tingling.   The history is provided by the patient. No language interpreter was used.    Past Medical History:  Diagnosis Date  . Asthma   . Bipolar disorder ([facility])   . Hypertension   . Schizophrenia [facility])     Patient Active Problem List   Diagnosis Date Noted  . Aggressive behavior 06/14/2016  . Tobacco use disorder 04/04/2016  . Schizoaffective disorder, bipolar type ([facility]) 04/20/2012    Past Surgical History:  Procedure Laterality Date  . KNEE SURGERY         Home Medications    Prior to Admission medications   Medication Sig Start Date End Date Taking? Authorizing Provider  montelukast (SINGULAIR) 10 MG tablet Take 10 mg by mouth daily. 07/02/16   Historical Provider, MD  QUEtiapine (SEROQUEL XR) 400 MG 24 hr tablet Take 2 tablets (800 mg total) by mouth at bedtime. 04/10/16   Shari ProwsJolanta B Pucilowska, MD    Family History Family History  Problem Relation  Age of Onset  . Family history unknown: Yes    Social History Social History  Substance Use Topics  . Smoking status: Current Some Day Smoker    Packs/day: 0.50    Years: 10.00    Types: Cigars  . Smokeless tobacco: Never Used  . Alcohol use Yes     Comment: Pt stated that he rarely consumes alcohol     Allergies   Vistaril [hydroxyzine]   Review of Systems Review of Systems  Musculoskeletal: Positive for arthralgias (left knee) and back pain.  Neurological: Negative for weakness and numbness.  All other systems reviewed and are negative.   Physical Exam Updated Vital Signs BP 134/88 (BP Location: Left Arm)   Pulse 67   Temp 98.2 F (36.8 C) (Oral)   Resp 16   Ht 6\' 2"  (1.88 m)   Wt 215 lb (97.5 kg)   SpO2 100%   BMI 27.60 kg/m   Physical Exam  Constitutional: He is oriented to person, place, and time. He appears well-developed and well-nourished.  HENT:  Head: Normocephalic and atraumatic.  Eyes: EOM are normal. Pupils are equal, round, and reactive to light.  Neck: Normal range of motion. Neck supple. No JVD present.  Cardiovascular: Normal rate, regular rhythm and normal heart sounds.   No murmur heard. Pulmonary/Chest: Effort normal and breath sounds normal. He has no wheezes. He has no rales. He exhibits no tenderness.  Abdominal: Soft.  Bowel sounds are normal. He exhibits no distension and no mass. There is no tenderness.  Musculoskeletal: Normal range of motion. He exhibits no edema.  Mild left paralumbar spasm. Negative SLR. Left knee has no effusion or swelling. No instability. Lachman McMurray test is negative.   Lymphadenopathy:    He has no cervical adenopathy.  Neurological: He is alert and oriented to person, place, and time. No cranial nerve deficit. He exhibits normal muscle tone. Coordination normal.  Skin: Skin is warm and dry. No rash noted.  Psychiatric: He has a normal mood and affect. His behavior is normal. Judgment and thought content  normal.  Nursing note and vitals reviewed.    ED Treatments / Results   DIAGNOSTIC STUDIES: Oxygen Saturation is 100% on RA, normal by my interpretation.    COORDINATION OF CARE: 1:58 AM Discussed treatment plan with pt at bedside which includes XR and pt agreed to plan.     Radiology Dg Knee Complete 4 Views Left  Result Date: 07/21/2016 CLINICAL DATA:  Back pain and knee pain EXAM: LEFT KNEE - COMPLETE 4+ VIEW COMPARISON:  None. FINDINGS: Patient is status post ACL repair. No acute fracture or dislocation. Mild patellofemoral degenerative changes. Mild medial and lateral joint space narrowing. Faint calcifications at the disc space suggests chondrocalcinosis. No large effusion. IMPRESSION: 1. Patient is status post ACL repair.  No acute osseous abnormality. 2. Mild degenerative changes. Faint calcifications within the medial and lateral joint space compatible with chondrocalcinosis. Electronically Signed   By: Jasmine Pang M.D.   On: 07/21/2016 03:55    Procedures Procedures (including critical care time)  Medications Ordered in ED Medications  ketorolac (TORADOL) injection 60 mg (60 mg Intramuscular Given 07/21/16 0206)  cyclobenzaprine (FLEXERIL) tablet 10 mg (10 mg Oral Given 07/21/16 0206)     Initial Impression / Assessment and Plan / ED Course  I have reviewed the triage vital signs and the nursing notes.  Pertinent labs & imaging results that were available during my care of the patient were reviewed by me and considered in my medical decision making (see chart for details).  Clinical Course   Back pain which appears to be musculoskeletal. No red flags to suggest more serious conditions. Chronic left knee pain without evidence of serious injury. He is given a dose of ketorolac and cyclobenzaprine with significant relief. Following this, he is sleeping. Left knee x-ray shows evidence of chondrocalcinosis but no acute process. He is discharged with prescriptions for  naproxen and cyclobenzaprine.  Final Clinical Impressions(s) / ED Diagnoses   Final diagnoses:  Low back pain without sciatica, unspecified back pain laterality, unspecified chronicity  Left knee pain, unspecified chronicity  Homeless    New Prescriptions New Prescriptions   CYCLOBENZAPRINE (FLEXERIL) 10 MG TABLET    Take 1 tablet (10 mg total) by mouth 3 (three) times daily as needed for muscle spasms.   NAPROXEN (NAPROSYN) 500 MG TABLET    Take 1 tablet (500 mg total) by mouth 2 (two) times daily.    I personally performed the services described in this documentation, which was scribed in my presence. The recorded information has been reviewed and is accurate.       Dione Booze, MD 07/21/16 [phone]

## 2016-07-21 NOTE — ED Notes (Signed)
Called for patient x 2 in sub waiting room. Not present.

## 2016-07-25 ENCOUNTER — Emergency Department (HOSPITAL_COMMUNITY)
Admission: EM | Admit: 2016-07-25 | Discharge: 2016-07-26 | Disposition: A | Discharge status: Home / Self Care | Payer: Medicaid Other | Attending: Emergency Medicine | Admitting: Emergency Medicine

## 2016-07-25 ENCOUNTER — Encounter (HOSPITAL_COMMUNITY): Payer: Self-pay

## 2016-07-25 DIAGNOSIS — F17290 Nicotine dependence, other tobacco product, uncomplicated: Secondary | ICD-10-CM | POA: Insufficient documentation

## 2016-07-25 DIAGNOSIS — R45851 Suicidal ideations: Secondary | ICD-10-CM | POA: Diagnosis present

## 2016-07-25 DIAGNOSIS — F250 Schizoaffective disorder, bipolar type: Secondary | ICD-10-CM | POA: Diagnosis not present

## 2016-07-25 DIAGNOSIS — J45909 Unspecified asthma, uncomplicated: Secondary | ICD-10-CM | POA: Insufficient documentation

## 2016-07-25 DIAGNOSIS — F28 Other psychotic disorder not due to a substance or known physiological condition: Secondary | ICD-10-CM | POA: Insufficient documentation

## 2016-07-25 DIAGNOSIS — F17210 Nicotine dependence, cigarettes, uncomplicated: Secondary | ICD-10-CM | POA: Diagnosis not present

## 2016-07-25 DIAGNOSIS — Z79899 Other long term (current) drug therapy: Secondary | ICD-10-CM | POA: Insufficient documentation

## 2016-07-25 DIAGNOSIS — I10 Essential (primary) hypertension: Secondary | ICD-10-CM | POA: Insufficient documentation

## 2016-07-25 NOTE — ED Notes (Signed)
Pt has in belonging bag:  2 black book bag, white t-shirt, blue paper pants, black slippers.

## 2016-07-25 NOTE — ED Notes (Signed)
Bed: [id number] Expected date:  Expected time:  Means of arrival:  [facility]

## 2016-07-25 NOTE — ED Notes (Signed)
Pt IVCd from Ryan York, pt is making sexual advances to everyone and masturbating. Pt is not taking his meds and hes having auditory hallucinations that are telling him to harm others.

## 2016-07-26 ENCOUNTER — Encounter (HOSPITAL_COMMUNITY): Payer: Self-pay | Admitting: Emergency Medicine

## 2016-07-26 DIAGNOSIS — F250 Schizoaffective disorder, bipolar type: Secondary | ICD-10-CM

## 2016-07-26 DIAGNOSIS — Z79899 Other long term (current) drug therapy: Secondary | ICD-10-CM

## 2016-07-26 DIAGNOSIS — F17210 Nicotine dependence, cigarettes, uncomplicated: Secondary | ICD-10-CM

## 2016-07-26 LAB — COMPREHENSIVE METABOLIC PANEL
ALBUMIN: 4.1 g/dL (ref 3.5–5.0)
ALK PHOS: 60 U/L (ref 38–126)
ALT: 29 U/L (ref 17–63)
AST: 29 U/L (ref 15–41)
Anion gap: 6 (ref 5–15)
BUN: 11 mg/dL (ref 6–20)
CALCIUM: 8.8 mg/dL — AB (ref 8.9–10.3)
CO2: 27 mmol/L (ref 22–32)
CREATININE: 0.98 mg/dL (ref 0.61–1.24)
Chloride: 106 mmol/L (ref 101–111)
GFR calc Af Amer: >60 mL/min (ref >60)
GFR calc non Af Amer: >60 mL/min (ref >60)
GLUCOSE: 131 mg/dL — AB (ref 65–99)
Potassium: 4.2 mmol/L (ref 3.5–5.1)
SODIUM: 139 mmol/L (ref 135–145)
Total Bilirubin: 0.3 mg/dL (ref 0.3–1.2)
Total Protein: 7 g/dL (ref 6.5–8.1)

## 2016-07-26 LAB — SALICYLATE LEVEL: Salicylate Lvl: <7 mg/dL (ref 2.8–30.0)

## 2016-07-26 LAB — CBC
HEMATOCRIT: 43.9 % (ref 39.0–52.0)
HEMOGLOBIN: 15.5 g/dL (ref 13.0–17.0)
MCH: 33.3 pg (ref 26.0–34.0)
MCHC: 35.3 g/dL (ref 30.0–36.0)
MCV: 94.2 fL (ref 78.0–100.0)
Platelets: 160 10*3/uL (ref 150–400)
RBC: 4.66 MIL/uL (ref 4.22–5.81)
RDW: 12.9 % (ref 11.5–15.5)
WBC: 6.1 10*3/uL (ref 4.0–10.5)

## 2016-07-26 LAB — ETHANOL: Alcohol, Ethyl (B): <5 mg/dL (ref <5)

## 2016-07-26 LAB — ACETAMINOPHEN LEVEL: Acetaminophen (Tylenol), Serum: <10 ug/mL — ABNORMAL LOW (ref 10–30)

## 2016-07-26 LAB — RAPID URINE DRUG SCREEN, HOSP PERFORMED
Amphetamines: NOT DETECTED
BARBITURATES: NOT DETECTED
Benzodiazepines: NOT DETECTED
COCAINE: NOT DETECTED
Opiates: NOT DETECTED
TETRAHYDROCANNABINOL: NOT DETECTED

## 2016-07-26 MED ORDER — IBUPROFEN 200 MG PO TABS
600.0000 mg | ORAL_TABLET | Freq: Three times a day (TID) | ORAL | Status: DC | PRN
  Administered 2016-07-26: 600 mg via ORAL
  Filled 2016-07-26: qty 3

## 2016-07-26 MED ORDER — ALUM & MAG HYDROXIDE-SIMETH 200-200-20 MG/5ML PO SUSP: 30.0000 mL | ORAL | Status: DC | PRN

## 2016-07-26 MED ORDER — ONDANSETRON HCL 4 MG PO TABS
4.0000 mg | ORAL_TABLET | Freq: Three times a day (TID) | ORAL | Status: DC | PRN
Start: 2016-07-26 — End: 2016-07-26

## 2016-07-26 NOTE — Progress Notes (Signed)
CSW logged patient's notice of commitment change paperwork into [name] log book.

## 2016-07-26 NOTE — BH Assessment (Addendum)
Tele Assessment Note   Ryan York is a 45 y.o. male.who [facility]. [facility] get [facility] and he cannot recall what happened after that.  Pt reportedly was making inappropriate sexual gestures and being hostile with staff. Pt states he is currently not seeing a therapist or psychiatrist right now and has been off his medications for the past 2 weeks. Pt states he is currently homeless and is working with his ACTT team to get back on his medications and to find housing. Pt is currently denying any S/I and/or H/I. However, pt reports constantly hearing voices that are telling him stuff about his family that he doesn't want to hear. Pt reports drinking 1 pint of alcohol every other week and denies having a problem with alcohol. Pt denies any other drug use.  Pt also denies any abuse or trauma history.     Pt was pleasant and cooperative during assessment. Pt was dressed in hospital scrubs but overall appearance was disheveled.  Pt did not make eye contact with therapist during assessment and appeared to be responding to internal voices.  Pt stated he did not want to admitted into hospital but would follow up with his ACTT team and they would be able to provide the right resources for him.    Diagnosis: Schizoaffective Disorder   Past Medical History:  Past Medical History:  Diagnosis Date  . Asthma   . Bipolar disorder (HCC)   . Hypertension   . Schizophrenia [facility](HCC)     Past Surgical History:  Procedure Laterality Date  . KNEE SURGERY      Family History:  Family History  Problem Relation Age of Onset  . Family history unknown: Yes    Social History:  reports that he has been smoking Cigars.  He has a 5.00 pack-year smoking history. He has never used smokeless tobacco. He reports that he drinks alcohol. He reports that he does not use drugs.  Additional Social History:  Alcohol / Drug Use Pain Medications: pt  denies Prescriptions: Asthma medications Over the Counter: pt denies  History of alcohol / drug use?: Yes Longest period of sobriety (when/how long): unknown  Substance #1 Name of Substance 1: Alcohol 1 - Age of First Use: 14 1 - Amount (size/oz): 1 pint 1 - Frequency: weekly 1 - Duration: consistently  1 - Last Use / Amount: last week   CIWA: CIWA-Ar BP: 139/92 Pulse Rate: 68 COWS:    PATIENT STRENGTHS: (choose at least two) Communication skills General fund of knowledge Physical Health  Allergies:  Allergies  Allergen Reactions  . Vistaril [Hydroxyzine] Nausea Only    Severe stomach pain    Home Medications:  (Not in a hospital admission)  OB/GYN Status:  No LMP for male patient.  General Assessment Data [facility] of Assessment: [facility] TTS Assessment: In system Is this a Tele or Face-to-Face Assessment?: Face-to-Face Is this an Initial Assessment or a Re-assessment for this encounter?: Initial Assessment Marital status: Single Is patient pregnant?: No Pregnancy Status: No Living Arrangements: Other (Comment) (homeless) Can pt return to current living arrangement?: No Admission Status: Involuntary Is patient capable of signing voluntary admission?: Yes Referral Source: Other Museum/gallery curator([facility]) Insurance type: Medicaid     Crisis Care Plan Living Arrangements: Other (Comment) (homeless) Legal Guardian:  (self) Name of Psychiatrist: unknown  Name of Therapist: [facility]  Education Status Is patient currently in school?: No Highest grade  of school patient has completed: some college Name of school: [employer] person: NA  Risk to self with the past 6 months Suicidal Ideation: No Has patient been a risk to self within the past 6 months prior to admission? : No Suicidal Intent: No Has patient had any suicidal intent within the past 6 months prior to admission? : No Is patient at risk for suicide?: No Suicidal Plan?: No Has patient had any suicidal plan  within the past 6 months prior to admission? : No Access to Means: No What has been your use of drugs/alcohol within the last 12 months?: pt reports drinking alcohol occasionally Other Self Harm Risks: none reported Triggers for Past Attempts: Hallucinations Intentional Self Injurious Behavior: None Family Suicide History: No Recent stressful life event(s): Loss (Comment), Recent negative physical changes (back pain; homeless) Persecutory voices/beliefs?: Yes Depression: Yes Depression Symptoms: Loss of interest in usual pleasures, Feeling worthless/self pity, Feeling angry/irritable Substance abuse history and/or treatment for substance abuse?: No Suicide prevention information given to non-admitted patients: Not applicable  Risk to Others within the past 6 months Homicidal Ideation: No Does patient have any lifetime risk of violence toward others beyond the six months prior to admission? : No Thoughts of Harm to Others: No Comment - Thoughts of Harm to Others: na Current Homicidal Intent: No Current Homicidal Plan: No Access to Homicidal Means: No Describe Access to Homicidal Means: na Identified Victim: na History of harm to others?: No Assessment of Violence: None Noted Violent Behavior Description: na Does patient have access to weapons?: No Criminal Charges Pending?: No Describe Pending Criminal Charges: na Does patient have a court date: No Is patient on probation?: No  Psychosis Hallucinations: Auditory Delusions: None noted  Mental Status Report Appearance/Hygiene: Disheveled, In scrubs, Bizarre Eye Contact: Fair Motor Activity: Unsteady Speech: Slow, Slurred, Incoherent Level of Consciousness: Restless, Alert Mood: Irritable, Worthless, low self-esteem Affect: Irritable, Appropriate to circumstance, Depressed, Anxious Anxiety Level: None Thought Processes: Coherent Judgement: Impaired Orientation: Person, Place, Time Obsessive Compulsive Thoughts/Behaviors:  None  Cognitive Functioning Concentration: Decreased Memory: Recent Impaired IQ: Average Insight: Fair Impulse Control: Good Appetite: Good Weight Loss: 0 Weight Gain: 0 Sleep: No Change Total Hours of Sleep: 6 Vegetative Symptoms: None  ADLScreening [facility] Assessment Services) Patient's cognitive ability adequate to safely complete daily activities?: Yes Patient able to express need for assistance with ADLs?: Yes Independently performs ADLs?: Yes (appropriate for developmental age)  Prior Inpatient Therapy Prior Inpatient Therapy: Yes Prior Therapy Dates: 2010, 2012,2017 Prior Therapy Facilty/Provider(s): [facility] Reason for Treatment: Schizoaffective DIosrder  Prior Outpatient Therapy Prior Outpatient Therapy: Yes Prior Therapy Dates: in 2 weeks Prior Therapy Facilty/Provider(s): [facility] Reason for Treatment: Schizoaffective Disorder Does patient have an ACCT team?: Yes Does patient have Intensive In-House Services?  : No Does patient have [facility] services? : Yes Does patient have P4CC services?: No  ADL Screening (condition at time of admission) Patient's cognitive ability adequate to safely complete daily activities?: Yes Is the patient deaf or have difficulty hearing?: No Does the patient have difficulty seeing, even when wearing glasses/contacts?: No Does the patient have difficulty concentrating, remembering, or making decisions?: No Patient able to express need for assistance with ADLs?: Yes Does the patient have difficulty dressing or bathing?: No Independently performs ADLs?: Yes (appropriate for developmental age)       Abuse/Neglect Assessment (Assessment to be complete while patient is alone) Physical Abuse: Denies Verbal Abuse: Denies Sexual Abuse: Denies Exploitation of patient/patient's resources: Denies Self-Neglect: Denies  Values / Beliefs Cultural Requests During Hospitalization: None Spiritual Requests During Hospitalization: None   Advance  Directives (For Healthcare) Does patient have an advance directive?: No Would patient like information on creating an advanced directive?: No - patient declined information    Additional Information 1:1 In Past 12 Months?: No CIRT Risk: No Elopement Risk: No Does patient have medical clearance?: Yes     Disposition: Gave clinical report to [name], FNP who states client will need a AM psychiatric evaluation.    Orlie Pollenshley Tekeyah Santiago, [facility] 07/26/2016 2:19 AM    Evlyn CourierAshley n Acelyn Basham 07/26/2016 1:50 AM

## 2016-07-26 NOTE — ED Notes (Signed)
Pt d/c home per MD order. Discharge summary reviewed with pt. Pt verbalizes understanding. Pt denies SI/HI. Denies AVH. Pt signed for personal property and property returned. Pt signed e-signature. Ambulatory off unit.

## 2016-07-26 NOTE — ED Notes (Signed)
SBAR Report received from previous nurse. Pt received calm and visible on unit. Pt denies current HI, A/V H, depression, anxiety, and pain at this time, and is otherwise stable but endorses SI and reports AH "sometimes" guarded and non comital with answers to assessment questions, and went directly to sleep Pt reminded of camera surveillance, q 15 min rounds, and rules of the milieu. Pt screened for contraband by Clinical research associatewriter, will continue to assess.

## 2016-07-26 NOTE — ED Notes (Signed)
Pt resting well, TSS completed

## 2016-07-26 NOTE — [facility] Suicide Risk Assessment (Signed)
Suicide Risk Assessment  Discharge Assessment   [facility]   Principal Problem: Schizoaffective disorder, bipolar type [facility](HCC) Discharge Diagnoses:  Patient Active Problem List   Diagnosis Date Noted  . Aggressive behavior [R45.89] 06/14/2016  . Tobacco use disorder [F17.200] 04/04/2016  . Schizoaffective disorder, bipolar type (HCC) [F25.0] 04/20/2012    Total Time spent with patient: 15 minutes  Musculoskeletal: Strength & Muscle Tone: within normal limits Gait & Station: normal Patient leans: N/A  Psychiatric Specialty Exam:   Blood pressure 121/71, pulse 90, temperature 98.2 F (36.8 C), temperature source Oral, resp. rate 16, SpO2 97 %.There is no height or weight on file to calculate BMI.   General Appearance: Casual and Fairly Groomed  Eye Contact:  Good  Speech:  Clear and Coherent and Normal Rate  Volume:  Normal  Mood:  Euthymic  Affect:  Congruent  Thought Process:  Coherent and Goal Directed  Orientation:  Full (Time, Place, and Person)  Thought Content:  Logical  Suicidal Thoughts:  No  Homicidal Thoughts:  No  Memory:  Immediate;   Good Recent;   Fair  Judgement:  Fair  Insight:  Present  Psychomotor Activity:  Normal  Concentration:  Concentration: Fair and Attention Span: Fair  Recall:  Good  Fund of Knowledge:  Good  Language:  Good  Akathisia:  No  Handed:  Right  AIMS (if indicated):     Assets:  Communication Skills Desire for Improvement Housing Physical Health Resilience Social Support  ADL's:  Intact  Cognition:  WNL  Sleep:      Mental Status Per Nursing Assessment::   On Admission:     Demographic Factors:  Male, Low socioeconomic status and Unemployed  Loss Factors: NA  Historical Factors: NA  Risk Reduction Factors:   Living with another person, especially a relative and Positive social support  Continued Clinical Symptoms:  Schizophrenia  Cognitive Features That Contribute To Risk:  None     Suicide Risk:  Minimal: No identifiable suicidal ideation.  Patients presenting with no risk factors but with morbid ruminations; may be classified as minimal risk based on the severity of the depressive symptoms     Plan Of Care/Follow-up recommendations:  Activity:  as tolerated Diet:  regular Tests:  as determined by PCP  1.Take all your medications as prescribed.  2. Report any adverse side effects to your medication to your outpatient provider. 3. Do not use alcohol or illegal drugs while taking prescription medications. 4. In the event of worsening symptoms, call 911, the crisis hotline or go to nearest emergency room for evaluation of symptoms. 5. [facility] with [facility] for [facility] management and ACT team services.  Alberteen SamFran Cassanda Walmer, FNP-BC [facility] 07/26/2016, 11:09 AM

## 2016-07-26 NOTE — ED Provider Notes (Addendum)
[facility]-EMERGENCY DEPT Provider Note   CSN: [id number] Arrival date & time: 07/25/16  2329   By signing my name below, I, Ryan York, attest that this documentation has been prepared under the direction and in the presence of Ryan Frith, MD . Electronically Signed: Nelwyn York, Scribe. 07/26/2016. 12:02 AM.  History   Chief Complaint Chief Complaint  Patient presents with  . Homicidal   The history is provided by the patient. No language interpreter was used.  Mental Health Problem  Presenting symptoms: homicidal ideas   Presenting symptoms: no self-mutilation   Presenting symptoms comment:  Hypersexual Patient accompanied by:  Law enforcement Degree of incapacity (severity):  Unable to specify Onset quality:  Unable to specify Timing:  Unable to specify Progression:  Unable to specify Chronicity:  Chronic Context: not stressful life event   Treatment compliance:  Untreated Relieved by:  Nothing Worsened by:  Nothing Ineffective treatments:  None tried Associated symptoms: no abdominal pain   Risk factors: no neurological disease     HPI Comments:  Ryan York is a 45 y.o. male with h/o Bipolar disorder who presents to the Emergency Department  From [facility] having become hypersexual and making sexual advances to the nursing home staff. Staff were unable to deescalate the situation and called EMS to bring the pt to the ED. Pt also states that he is having a headache that is caused by the telephone wires and states that "they are pressing into his head". Ryan York also reports the patient has been homicidal and aggressive to other residents.   Past Medical History:  Diagnosis Date  . Asthma   . Bipolar disorder (HCC)   . Hypertension   . Schizophrenia [facility])     Patient Active Problem List   Diagnosis Date Noted  . Aggressive behavior 06/14/2016  . Tobacco use disorder 04/04/2016  . Schizoaffective disorder, bipolar type (HCC) 04/20/2012    Past Surgical  History:  Procedure Laterality Date  . KNEE SURGERY         Home Medications    Prior to Admission medications   Medication Sig Start Date End Date Taking? Authorizing Provider  cyclobenzaprine (FLEXERIL) 10 MG tablet Take 1 tablet (10 mg total) by mouth 3 (three) times daily as needed for muscle spasms. 07/21/16  Yes Ryan Booze, MD  montelukast (SINGULAIR) 10 MG tablet Take 10 mg by mouth daily. 07/02/16  Yes Historical Provider, MD  QUEtiapine (SEROQUEL XR) 400 MG 24 hr tablet Take 2 tablets (800 mg total) by mouth at bedtime. Patient taking differently: Take 400 mg by mouth at bedtime.  04/10/16  Yes Ryan B Pucilowska, MD  naproxen (NAPROSYN) 500 MG tablet Take 1 tablet (500 mg total) by mouth 2 (two) times daily. Patient not taking: Reported on 07/26/2016 07/21/16   Ryan Booze, MD    Family History Family History  Problem Relation Age of Onset  . Family history unknown: Yes    Social History Social History  Substance Use Topics  . Smoking status: Current Some Day Smoker    Packs/day: 0.50    Years: 10.00    Types: Cigars  . Smokeless tobacco: Never Used  . Alcohol use Yes     Comment: Pt stated that he rarely consumes alcohol     Allergies   Vistaril [hydroxyzine]   Review of Systems Review of Systems  Gastrointestinal: Negative for abdominal pain.  Psychiatric/Behavioral: Positive for homicidal ideas. Negative for self-injury.       Homicidal ideation  All  other systems reviewed and are negative.    Physical Exam Updated Vital Signs BP 139/92 (BP Location: Left Arm)   Pulse 68   Temp 98.1 F (36.7 C) (Oral)   Resp 18   SpO2 100%   Physical Exam  Constitutional: He is oriented to person, place, and time. He appears well-developed and well-nourished. No distress.  HENT:  Head: Normocephalic and atraumatic.  Mouth/Throat: Oropharynx is clear and moist. No oropharyngeal exudate.  Moist mucous membranes   Eyes: Conjunctivae are normal. Pupils are  equal, round, and reactive to light.  Neck: Normal range of motion. Neck supple. No JVD present.  Trachea midline No bruit  Cardiovascular: Normal rate, regular rhythm and normal heart sounds.   Pulmonary/Chest: Effort normal and breath sounds normal. No stridor. No respiratory distress.  Abdominal: Soft. Bowel sounds are normal. He exhibits no distension.  Musculoskeletal: Normal range of motion.  Neurological: He is alert and oriented to person, place, and time. He has normal reflexes.  Skin: Skin is warm and dry. Capillary refill takes less than 2 seconds.  Psychiatric:  Tangential and circumstantial.   Nursing note and vitals reviewed.    ED Treatments / Results   Vitals:   07/25/16 2331  BP: 139/92  Pulse: 68  Resp: 18  Temp: 98.1 F (36.7 C)   Results for orders placed or performed during the hospital encounter of 07/25/16  Comprehensive metabolic panel  Result Value Ref Range   Sodium 139 135 - 145 mmol/L   Potassium 4.2 3.5 - 5.1 mmol/L   Chloride 106 101 - 111 mmol/L   CO2 27 22 - 32 mmol/L   Glucose, Bld 131 (H) 65 - 99 mg/dL   BUN 11 6 - 20 mg/dL   Creatinine, Ser 1.610.98 0.61 - 1.24 mg/dL   Calcium 8.8 (L) 8.9 - 10.3 mg/dL   Total Protein 7.0 6.5 - 8.1 g/dL   Albumin 4.1 3.5 - 5.0 g/dL   AST 29 15 - 41 U/L   ALT 29 17 - 63 U/L   Alkaline Phosphatase 60 38 - 126 U/L   Total Bilirubin 0.3 0.3 - 1.2 mg/dL   GFR calc non Af Amer >60 >60 mL/min   GFR calc Af Amer >60 >60 mL/min   Anion gap 6 5 - 15  Ethanol  Result Value Ref Range   Alcohol, Ethyl (B) <5 <5 mg/dL  Salicylate level  Result Value Ref Range   Salicylate Lvl <7.0 2.8 - 30.0 mg/dL  Acetaminophen level  Result Value Ref Range   Acetaminophen (Tylenol), Serum <10 (L) 10 - 30 ug/mL  cbc  Result Value Ref Range   WBC 6.1 4.0 - 10.5 K/uL   RBC 4.66 4.22 - 5.81 MIL/uL   Hemoglobin 15.5 13.0 - 17.0 g/dL   HCT 09.643.9 04.539.0 - 40.952.0 %   MCV 94.2 78.0 - 100.0 fL   MCH 33.3 26.0 - 34.0 pg   MCHC 35.3  30.0 - 36.0 g/dL   RDW 81.112.9 91.411.5 - 78.215.5 %   Platelets 160 150 - 400 K/uL  Rapid urine drug screen (hospital performed)  Result Value Ref Range   Opiates NONE DETECTED NONE DETECTED   Cocaine NONE DETECTED NONE DETECTED   Benzodiazepines NONE DETECTED NONE DETECTED   Amphetamines NONE DETECTED NONE DETECTED   Tetrahydrocannabinol NONE DETECTED NONE DETECTED   Barbiturates NONE DETECTED NONE DETECTED   Dg Knee Complete 4 Views Left  Result Date: 07/21/2016 CLINICAL DATA:  Back pain and knee pain  EXAM: LEFT KNEE - COMPLETE 4+ VIEW COMPARISON:  None. FINDINGS: Patient is status post ACL repair. No acute fracture or dislocation. Mild patellofemoral degenerative changes. Mild medial and lateral joint space narrowing. Faint calcifications at the disc space suggests chondrocalcinosis. No large effusion. IMPRESSION: 1. Patient is status post ACL repair.  No acute osseous abnormality. 2. Mild degenerative changes. Faint calcifications within the medial and lateral joint space compatible with chondrocalcinosis. Electronically Signed   By: Jasmine PangKim  Fujinaga M.D.   On: 07/21/2016 03:55    Procedures Procedures (including critical care time)   Final Clinical Impressions(s) / ED Diagnoses   To be seen by psychiatry in am.    I personally performed the services described in this documentation, which was scribed in my presence. The recorded information has been reviewed and is accurate.       Cy BlamerApril Kadence Mikkelson, MD 07/26/16 [id number]    Cy BlamerApril Ainsley Deakins, MD 07/26/16 [phone]

## 2016-07-26 NOTE — Consult Note (Signed)
[facility] Consult   Reason for Consult:  Psychiatric evaluation Referring Physician:  EDP Patient Identification: Ryan York MRN:  [id number] Principal Diagnosis: Schizoaffective disorder, bipolar type [facility]) Diagnosis:   Patient Active Problem List   Diagnosis Date Noted  . Aggressive behavior [R45.89] 06/14/2016  . Tobacco use disorder [F17.200] 04/04/2016  . Schizoaffective disorder, bipolar type ([place]) [F25.0] 04/20/2012    Total Time spent with patient: 45 minutes  Subjective:   Ryan York is a 45 y.o. male patient who states "I had a little situation going and they brought me over here." Per [place] therapeutic triage assessment, Ryan York is a 46 y.o. male.who was IVCd from [place] to [employer]. Pt states he was at [facility] today trying to get back on his medications and he cannot recall what happened after that.  Pt reportedly was making inappropriate sexual gestures and being hostile with staff. Pt states he is currently not seeing a therapist or psychiatrist right now and has been off his medications for the past 2 weeks. Pt states he is currently homeless and is working with his ACTT team to get back on his medications and to find housing. Pt is currently denying any S/I and/or H/I. However, pt reports constantly hearing voices that are telling him stuff about his family that he doesn't want to hear. Pt reports drinking 1 pint of alcohol every other week and denies having a problem with alcohol. Pt denies any other drug use.  Pt also denies any abuse or trauma history.     Pt was pleasant and cooperative during assessment. Pt was dressed in hospital scrubs but overall appearance was disheveled.  Pt did not make eye contact with therapist during assessment and appeared to be responding to internal voices.  Pt stated he did not want to admitted into hospital but would follow up with his ACTT team and they would be able to provide the right  resources for him.    Evaluation on the unit: Ryan York is a 45 yo African-American male who presented for evaluation after being placed under involuntary commitment at [facility] due to making sexual advances and being hostile toward staff. The patient states that he has recently gotten back on his medication, Seroquel. He states he has resumed services with his ACT team at [facility]. He states the ACT team is going to help him secure housing and to help him to enroll in a payee program. He is currently staying with a friend. Today he denies suicidal, homicidal ideation, intent or plan. He has a history of auditory hallucinations but states "they ain't been messing with me lately."  Past Psychiatric History: Schizophrenia  Risk to Self: Suicidal Ideation: No Suicidal Intent: No Is patient at risk for suicide?: No Suicidal Plan?: No Access to Means: No What has been your use of drugs/alcohol within the last 12 months?: pt reports drinking alcohol occasionally Other Self Harm Risks: none reported Triggers for Past Attempts: Hallucinations Intentional Self Injurious Behavior: None Risk to Others: Homicidal Ideation: No Thoughts of Harm to Others: No Comment - Thoughts of Harm to Others: na Current Homicidal Intent: No Current Homicidal Plan: No Access to Homicidal Means: No Describe Access to Homicidal Means: na Identified Victim: na History of harm to others?: No Assessment of Violence: None Noted Violent Behavior Description: na Does patient have access to weapons?: No Criminal Charges Pending?: No Describe Pending Criminal Charges: na Does patient have a court date: No Prior Inpatient Therapy:  Prior Inpatient Therapy: Yes Prior Therapy Dates: 2010, 2012,2017 Prior Therapy Facilty/Provider(s): [facility] Reason for Treatment: Schizoaffective DIosrder Prior Outpatient Therapy: Prior Outpatient Therapy: Yes Prior Therapy Dates: in 2 weeks Prior Therapy Facilty/Provider(s):  [facility] Reason for Treatment: Schizoaffective Disorder Does patient have an ACCT team?: Yes Does patient have Intensive In-House Services?  : No Does patient have [facility] services? : Yes Does patient have [facility] services?: No  Past Medical History:  Past Medical History:  Diagnosis Date  . Asthma   . Bipolar disorder ([place])   . Hypertension   . Schizophrenia [facility])     Past Surgical History:  Procedure Laterality Date  . KNEE SURGERY     Family History:  Family History  Problem Relation Age of Onset  . Family history unknown: Yes   Family Psychiatric  History: unknown Social History:  History  Alcohol Use  . Yes    Comment: Pt stated that he rarely consumes alcohol     History  Drug Use No    Comment: Pt denies     Social History   Social History  . Marital status: Single    Spouse name: N/A  . Number of children: N/A  . Years of education: N/A   Social History Main Topics  . Smoking status: Current Some Day Smoker    Packs/day: 0.50    Years: 10.00    Types: Cigars  . Smokeless tobacco: Never Used  . Alcohol use Yes     Comment: Pt stated that he rarely consumes alcohol  . Drug use: No     Comment: Pt denies   . Sexual activity: Not Asked   Other Topics Concern  . None   Social History Narrative  . None   Additional Social History:    Allergies:   Allergies  Allergen Reactions  . Vistaril [Hydroxyzine] Nausea Only    Severe stomach pain    Labs:  Results for orders placed or performed during the hospital encounter of 07/25/16 (from the past 48 hour(s))  Rapid urine drug screen (hospital performed)     Status: None   Collection Time: 07/25/16 11:35 PM  Result Value Ref Range   Opiates NONE DETECTED NONE DETECTED   Cocaine NONE DETECTED NONE DETECTED   Benzodiazepines NONE DETECTED NONE DETECTED   Amphetamines NONE DETECTED NONE DETECTED   Tetrahydrocannabinol NONE DETECTED NONE DETECTED   Barbiturates NONE DETECTED NONE DETECTED     Comment:        DRUG SCREEN FOR MEDICAL PURPOSES ONLY.  IF CONFIRMATION IS NEEDED FOR ANY PURPOSE, NOTIFY LAB WITHIN 5 DAYS.        LOWEST DETECTABLE LIMITS FOR URINE DRUG SCREEN Drug Class       Cutoff (ng/mL) Amphetamine      1000 Barbiturate      200 Benzodiazepine   734 Tricyclics       193 Opiates          300 Cocaine          300 THC              50   Comprehensive metabolic panel     Status: Abnormal   Collection Time: 07/25/16 11:43 PM  Result Value Ref Range   Sodium 139 135 - 145 mmol/L   Potassium 4.2 3.5 - 5.1 mmol/L   Chloride 106 101 - 111 mmol/L   CO2 27 22 - 32 mmol/L   Glucose, Bld 131 (H) 65 - 99 mg/dL  BUN 11 6 - 20 mg/dL   Creatinine, Ser 0.98 0.61 - 1.24 mg/dL   Calcium 8.8 (L) 8.9 - 10.3 mg/dL   Total Protein 7.0 6.5 - 8.1 g/dL   Albumin 4.1 3.5 - 5.0 g/dL   AST 29 15 - 41 U/L   ALT 29 17 - 63 U/L   Alkaline Phosphatase 60 38 - 126 U/L   Total Bilirubin 0.3 0.3 - 1.2 mg/dL   GFR calc non Af Amer >60 >60 mL/min   GFR calc Af Amer >60 >60 mL/min    Comment: (NOTE) The eGFR has been calculated using the CKD EPI equation. This calculation has not been validated in all clinical situations. eGFR's persistently <60 mL/min signify possible Chronic Kidney Disease.    Anion gap 6 5 - 15  Ethanol     Status: None   Collection Time: 07/25/16 11:43 PM  Result Value Ref Range   Alcohol, Ethyl (B) <5 <5 mg/dL    Comment:        LOWEST DETECTABLE LIMIT FOR SERUM ALCOHOL IS 5 mg/dL FOR MEDICAL PURPOSES ONLY   Salicylate level     Status: None   Collection Time: 07/25/16 11:43 PM  Result Value Ref Range   Salicylate Lvl <2.0 2.8 - 30.0 mg/dL  Acetaminophen level     Status: Abnormal   Collection Time: 07/25/16 11:43 PM  Result Value Ref Range   Acetaminophen (Tylenol), Serum <10 (L) 10 - 30 ug/mL    Comment:        THERAPEUTIC CONCENTRATIONS VARY SIGNIFICANTLY. A RANGE OF 10-30 ug/mL MAY BE AN EFFECTIVE CONCENTRATION FOR MANY PATIENTS. HOWEVER,  SOME ARE BEST TREATED AT CONCENTRATIONS OUTSIDE THIS RANGE. ACETAMINOPHEN CONCENTRATIONS >150 ug/mL AT 4 HOURS AFTER INGESTION AND >50 ug/mL AT 12 HOURS AFTER INGESTION ARE OFTEN ASSOCIATED WITH TOXIC REACTIONS.   cbc     Status: None   Collection Time: 07/25/16 11:43 PM  Result Value Ref Range   WBC 6.1 4.0 - 10.5 K/uL   RBC 4.66 4.22 - 5.81 MIL/uL   Hemoglobin 15.5 13.0 - 17.0 g/dL   HCT 43.9 39.0 - 52.0 %   MCV 94.2 78.0 - 100.0 fL   MCH 33.3 26.0 - 34.0 pg   MCHC 35.3 30.0 - 36.0 g/dL   RDW 12.9 11.5 - 15.5 %   Platelets 160 150 - 400 K/uL    Current Facility-Administered Medications  Medication Dose Route Frequency Provider Last Rate Last Dose  . alum & mag hydroxide-simeth (MAALOX/MYLANTA) 200-200-20 MG/5ML suspension 30 mL  30 mL Oral PRN April Palumbo, MD      . ibuprofen (ADVIL,MOTRIN) tablet 600 mg  600 mg Oral Q8H PRN April Palumbo, MD   600 mg at 07/26/16 0546  . ondansetron (ZOFRAN) tablet 4 mg  4 mg Oral Q8H PRN April Palumbo, MD       Current Outpatient Prescriptions  Medication Sig Dispense Refill  . cyclobenzaprine (FLEXERIL) 10 MG tablet Take 1 tablet (10 mg total) by mouth 3 (three) times daily as needed for muscle spasms. 30 tablet 0  . montelukast (SINGULAIR) 10 MG tablet Take 10 mg by mouth daily.  0  . QUEtiapine (SEROQUEL XR) 400 MG 24 hr tablet Take 2 tablets (800 mg total) by mouth at bedtime. (Patient taking differently: Take 400 mg by mouth at bedtime. ) 60 tablet 1  . naproxen (NAPROSYN) 500 MG tablet Take 1 tablet (500 mg total) by mouth 2 (two) times daily. (Patient not taking: Reported on  07/26/2016) 30 tablet 0    Musculoskeletal: Strength & Muscle Tone: within normal limits Gait & Station: normal Patient leans: N/A  Psychiatric Specialty Exam: Physical Exam  Nursing note and vitals reviewed.   Review of Systems  Constitutional: Negative.   HENT: Negative.   Eyes: Negative.   Respiratory: Negative.   Cardiovascular: Negative.    Gastrointestinal: Negative.   Genitourinary: Negative.   Musculoskeletal: Negative.   Skin: Negative.   Neurological: Negative.   Endo/Heme/Allergies: Negative.   Psychiatric/Behavioral: Negative.     Blood pressure 121/71, pulse 90, temperature 98.2 F (36.8 C), temperature source Oral, resp. rate 16, SpO2 97 %.There is no height or weight on file to calculate BMI.  General Appearance: Casual and Fairly Groomed  Eye Contact:  Good  Speech:  Clear and Coherent and Normal Rate  Volume:  Normal  Mood:  Euthymic  Affect:  Congruent  Thought Process:  Coherent and Goal Directed  Orientation:  Full (Time, Place, and Person)  Thought Content:  Logical  Suicidal Thoughts:  No  Homicidal Thoughts:  No  Memory:  Immediate;   Good Recent;   Fair  Judgement:  Fair  Insight:  Present  Psychomotor Activity:  Normal  Concentration:  Concentration: Fair and Attention Span: Fair  Recall:  Good  Fund of Knowledge:  Good  Language:  Good  Akathisia:  No  Handed:  Right  AIMS (if indicated):     Assets:  Communication Skills Desire for Improvement Housing Physical Health Resilience Social Support  ADL's:  Intact  Cognition:  WNL  Sleep:       Case discussed with Dr. Darleene Cleaver; recommendations are: Disposition: No evidence of imminent risk to self or others at present.   Patient does not meet criteria for psychiatric inpatient admission.  Patient follow-up with [facility] for outpatient management and ACT team services.  Serena Colonel, FNP-BC [place] 07/26/2016 11:09 AM  Patient seen face-to-face for psychiatric evaluation, chart reviewed and case discussed with the physician extender and developed treatment plan. Reviewed the information documented and agree with the treatment plan. Corena Pilgrim, MD

## 2016-07-28 ENCOUNTER — Encounter (HOSPITAL_COMMUNITY): Payer: Self-pay | Admitting: Emergency Medicine

## 2016-07-28 ENCOUNTER — Emergency Department (HOSPITAL_COMMUNITY)
Admission: EM | Admit: 2016-07-28 | Discharge: 2016-07-29 | Disposition: A | Discharge status: Home / Self Care | Payer: Medicaid Other | Attending: Emergency Medicine | Admitting: Emergency Medicine

## 2016-07-28 DIAGNOSIS — I10 Essential (primary) hypertension: Secondary | ICD-10-CM | POA: Insufficient documentation

## 2016-07-28 DIAGNOSIS — R440 Auditory hallucinations: Secondary | ICD-10-CM | POA: Insufficient documentation

## 2016-07-28 DIAGNOSIS — M791 Myalgia: Secondary | ICD-10-CM | POA: Insufficient documentation

## 2016-07-28 DIAGNOSIS — Z760 Encounter for issue of repeat prescription: Secondary | ICD-10-CM | POA: Diagnosis not present

## 2016-07-28 DIAGNOSIS — F17290 Nicotine dependence, other tobacco product, uncomplicated: Secondary | ICD-10-CM | POA: Insufficient documentation

## 2016-07-28 DIAGNOSIS — Z79899 Other long term (current) drug therapy: Secondary | ICD-10-CM | POA: Diagnosis not present

## 2016-07-28 DIAGNOSIS — J45909 Unspecified asthma, uncomplicated: Secondary | ICD-10-CM | POA: Insufficient documentation

## 2016-07-28 DIAGNOSIS — Z046 Encounter for general psychiatric examination, requested by authority: Secondary | ICD-10-CM | POA: Diagnosis present

## 2016-07-28 LAB — COMPREHENSIVE METABOLIC PANEL
ALT: 32 U/L (ref 17–63)
ANION GAP: 6 (ref 5–15)
AST: 29 U/L (ref 15–41)
Albumin: 4.1 g/dL (ref 3.5–5.0)
Alkaline Phosphatase: 78 U/L (ref 38–126)
BILIRUBIN TOTAL: 0.6 mg/dL (ref 0.3–1.2)
BUN: 17 mg/dL (ref 6–20)
CHLORIDE: 104 mmol/L (ref 101–111)
CO2: 29 mmol/L (ref 22–32)
Calcium: 8.9 mg/dL (ref 8.9–10.3)
Creatinine, Ser: 1.08 mg/dL (ref 0.61–1.24)
Glucose, Bld: 113 mg/dL — ABNORMAL HIGH (ref 65–99)
POTASSIUM: 4.4 mmol/L (ref 3.5–5.1)
Sodium: 139 mmol/L (ref 135–145)
TOTAL PROTEIN: 7.2 g/dL (ref 6.5–8.1)

## 2016-07-28 LAB — CBC
HCT: 44.6 % (ref 39.0–52.0)
Hemoglobin: 15.9 g/dL (ref 13.0–17.0)
MCH: 34.3 pg — ABNORMAL HIGH (ref 26.0–34.0)
MCHC: 35.7 g/dL (ref 30.0–36.0)
MCV: 96.1 fL (ref 78.0–100.0)
PLATELETS: 225 10*3/uL (ref 150–400)
RBC: 4.64 MIL/uL (ref 4.22–5.81)
RDW: 13 % (ref 11.5–15.5)
WBC: 6.6 10*3/uL (ref 4.0–10.5)

## 2016-07-28 LAB — SALICYLATE LEVEL

## 2016-07-28 LAB — ACETAMINOPHEN LEVEL: Acetaminophen (Tylenol), Serum: <10 ug/mL — ABNORMAL LOW (ref 10–30)

## 2016-07-28 LAB — ETHANOL

## 2016-07-28 NOTE — ED Triage Notes (Signed)
Pt reports to ER stating he is dealing with his "regular schizophrenic, bipolar stuff"; [place] and he wants to leave town; states he has not gotten the refills he needs from [facility]; pt states he needs a place to stay until he leaves town [employer].

## 2016-07-28 NOTE — ED Provider Notes (Signed)
[facility]-EMERGENCY DEPT Provider Note   CSN: [id number] Arrival date & time: 07/28/16  2047   By signing my name below, I, Valentino SaxonBianca Contreras, attest that this documentation has been prepared under the direction and in the presence of Cheri FowlerKayla Teshaun Olarte, [place]. Electronically Signed: Valentino SaxonBianca Contreras, ED Scribe. 07/28/16. 11:04 PM.  History   Chief Complaint Chief Complaint  Patient presents with  . Psychiatric Evaluation  . Medication Refill   The history is provided by the patient. No language interpreter was used.    HPI Comments: Ryan York is a 45 y.o. male who presents to the Emergency Department complaining of moderate, constant, schizophrenic and bipolar episodes onset this week. Pt reports that he's been having schizophrenic episodes accompanied with hearing voices. He states he began hearing voices ~ 3 months ago, and states they've been getting out of hand. He reports no modifying factors noted. Pt notes running out of his Seroquel medication for his bipolar disorder. He notes he is currently taking 400mg  q.d.  He denies suicidal and homicidal ideas. No additional complaints at this time.   Past Medical History:  Diagnosis Date  . Asthma   . Bipolar disorder ([facility])   . Hypertension   . Schizophrenia [facility](HCC)     Patient Active Problem List   Diagnosis Date Noted  . Aggressive behavior 06/14/2016  . Tobacco use disorder 04/04/2016  . Schizoaffective disorder, bipolar type ([facility]) 04/20/2012    Past Surgical History:  Procedure Laterality Date  . KNEE SURGERY         Home Medications    Prior to Admission medications   Medication Sig Start Date End Date Taking? Authorizing Provider  cyclobenzaprine (FLEXERIL) 10 MG tablet Take 1 tablet (10 mg total) by mouth 3 (three) times daily as needed for muscle spasms. 07/21/16   Dione Boozeavid Glick, MD  montelukast (SINGULAIR) 10 MG tablet Take 10 mg by mouth daily. 07/02/16   Historical Provider, MD  naproxen (NAPROSYN) 500 MG tablet Take  1 tablet (500 mg total) by mouth 2 (two) times daily. Patient not taking: Reported on 07/28/2016 07/21/16   Dione Boozeavid Glick, MD  QUEtiapine (SEROQUEL XR) 400 MG 24 hr tablet Take 1 tablet (400 mg total) by mouth at bedtime. 07/29/16   Cheri FowlerKayla Elvera Almario, PA-C    Family History Family History  Problem Relation Age of Onset  . Family history unknown: Yes    Social History Social History  Substance Use Topics  . Smoking status: Current Some Day Smoker    Packs/day: 0.50    Years: 10.00    Types: Cigars  . Smokeless tobacco: Never Used  . Alcohol use Yes     Comment: Pt stated that he rarely consumes alcohol     Allergies   Vistaril [hydroxyzine]   Review of Systems Review of Systems  Musculoskeletal: Positive for myalgias (back and knee, chronic).  Psychiatric/Behavioral: Positive for behavioral problems and hallucinations. Negative for self-injury and suicidal ideas.  All other systems reviewed and are negative.    Physical Exam Updated Vital Signs BP 123/66 (BP Location: Right Arm)   Pulse 68   Temp 98 F (36.7 C) (Oral)   Resp 20   Ht 6\' 2"  (1.88 m)   Wt 95.3 kg   SpO2 99%   BMI 26.96 kg/m   Physical Exam  Constitutional: He is oriented to person, place, and time. He appears well-developed and well-nourished.  HENT:  Head: Normocephalic and atraumatic.  Right Ear: External ear normal.  Left Ear: External ear  normal.  Eyes: Conjunctivae are normal. No scleral icterus.  Neck: No tracheal deviation present.  Cardiovascular: Normal rate, regular rhythm and normal heart sounds.   Pulmonary/Chest: Effort normal and breath sounds normal. No respiratory distress.  Abdominal: He exhibits no distension.  Musculoskeletal: Normal range of motion.  Neurological: He is alert and oriented to person, place, and time.  Skin: Skin is warm and dry.  Psychiatric: He has a normal mood and affect. His speech is normal and behavior is normal. He expresses no homicidal and no suicidal  ideation. He expresses no suicidal plans and no homicidal plans.  Tangential thoughts, patient is difficult to follow.      ED Treatments / Results   DIAGNOSTIC STUDIES: Oxygen Saturation is 99% on RA, normal by my interpretation.    COORDINATION OF CARE: 10:46 PM Discussed treatment plan with pt at bedside which includes labs and pt agreed to plan.   Labs (all labs ordered are listed, but only abnormal results are displayed) Labs Reviewed  COMPREHENSIVE METABOLIC PANEL - Abnormal; Notable for the following:       Result Value   Glucose, Bld 113 (*)    All other components within normal limits  ACETAMINOPHEN LEVEL - Abnormal; Notable for the following:    Acetaminophen (Tylenol), Serum <10 (*)    All other components within normal limits  CBC - Abnormal; Notable for the following:    MCH 34.3 (*)    All other components within normal limits  ETHANOL  SALICYLATE LEVEL  RAPID URINE DRUG SCREEN, HOSP PERFORMED    EKG  EKG Interpretation None       Radiology No results found.  Procedures Procedures (including critical care time)  Medications Ordered in ED Medications - No data to display   Initial Impression / Assessment and Plan / ED Course  I have reviewed the triage vital signs and the nursing notes.  Pertinent labs & imaging results that were available during my care of the patient were reviewed by me and considered in my medical decision making (see chart for details).  Clinical Course   Patient presents with auditory hallucinations.  Not taking medications.  No SI or HI.  No other complaints.  VSS, NAD.  Labs without acute abnormalities. Patient is medically cleared for TTS consultation. Patient doesn't meet inpatient criteria, will refill seroquel and discussed follow up with PCP.  Stable for discharge.  Patient agrees and acknowledges the above plan for discharge.    Final Clinical Impressions(s) / ED Diagnoses   Final diagnoses:  Auditory  hallucinations  Medication refill    New Prescriptions New Prescriptions   QUETIAPINE (SEROQUEL XR) 400 MG 24 HR TABLET    Take 1 tablet (400 mg total) by mouth at bedtime.   I personally performed the services described in this documentation, which was scribed in my presence. The recorded information has been reviewed and is accurate.     Cheri FowlerKayla Tyshay Adee, PA-C 07/29/16 [id number]    Lyndal Pulleyaniel Knott, MD 07/29/16 [phone]

## 2016-07-28 NOTE — ED Notes (Signed)
Bed: [facility] Expected date:  Expected time:  Means of arrival:  Comments: 

## 2016-07-28 NOTE — ED Notes (Signed)
Pt cursing loudly at another pt; pt calmed down with redirection from nurse

## 2016-07-29 MED ORDER — QUETIAPINE FUMARATE ER 400 MG PO TB24: 400.0000 mg | ORAL_TABLET | Freq: Every day | ORAL | 0 refills | Status: DC
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
## 2016-07-29 NOTE — ED Notes (Signed)
Counselor at bedside.

## 2016-08-03 ENCOUNTER — Encounter (HOSPITAL_COMMUNITY): Payer: Self-pay | Admitting: Emergency Medicine

## 2016-08-03 ENCOUNTER — Emergency Department (HOSPITAL_COMMUNITY)
Admission: EM | Admit: 2016-08-03 | Discharge: 2016-08-04 | Disposition: A | Discharge status: Home / Self Care | Payer: Medicaid Other | Attending: Emergency Medicine | Admitting: Emergency Medicine

## 2016-08-03 DIAGNOSIS — F250 Schizoaffective disorder, bipolar type: Secondary | ICD-10-CM | POA: Diagnosis present

## 2016-08-03 DIAGNOSIS — Z79899 Other long term (current) drug therapy: Secondary | ICD-10-CM | POA: Insufficient documentation

## 2016-08-03 DIAGNOSIS — I10 Essential (primary) hypertension: Secondary | ICD-10-CM | POA: Diagnosis not present

## 2016-08-03 DIAGNOSIS — F17290 Nicotine dependence, other tobacco product, uncomplicated: Secondary | ICD-10-CM | POA: Diagnosis not present

## 2016-08-03 DIAGNOSIS — J45909 Unspecified asthma, uncomplicated: Secondary | ICD-10-CM | POA: Diagnosis not present

## 2016-08-03 DIAGNOSIS — R45851 Suicidal ideations: Secondary | ICD-10-CM

## 2016-08-03 DIAGNOSIS — F17210 Nicotine dependence, cigarettes, uncomplicated: Secondary | ICD-10-CM | POA: Diagnosis not present

## 2016-08-03 DIAGNOSIS — R451 Restlessness and agitation: Secondary | ICD-10-CM | POA: Insufficient documentation

## 2016-08-03 LAB — CBC WITH DIFFERENTIAL/PLATELET
BASOS PCT: 0 %
Basophils Absolute: 0 10*3/uL (ref 0.0–0.1)
Eosinophils Absolute: 0.3 10*3/uL (ref 0.0–0.7)
Eosinophils Relative: 5 %
HEMATOCRIT: 43.7 % (ref 39.0–52.0)
HEMOGLOBIN: 15.1 g/dL (ref 13.0–17.0)
LYMPHS ABS: 3.2 10*3/uL (ref 0.7–4.0)
LYMPHS PCT: 49 %
MCH: 33.2 pg (ref 26.0–34.0)
MCHC: 34.6 g/dL (ref 30.0–36.0)
MCV: 96 fL (ref 78.0–100.0)
MONO ABS: 0.6 10*3/uL (ref 0.1–1.0)
MONOS PCT: 9 %
NEUTROS ABS: 2.4 10*3/uL (ref 1.7–7.7)
NEUTROS PCT: 37 %
Platelets: 171 10*3/uL (ref 150–400)
RBC: 4.55 MIL/uL (ref 4.22–5.81)
RDW: 13 % (ref 11.5–15.5)
WBC: 6.6 10*3/uL (ref 4.0–10.5)

## 2016-08-03 LAB — COMPREHENSIVE METABOLIC PANEL
ALT: 25 U/L (ref 17–63)
ANION GAP: 5 (ref 5–15)
AST: 26 U/L (ref 15–41)
Albumin: 4.2 g/dL (ref 3.5–5.0)
Alkaline Phosphatase: 65 U/L (ref 38–126)
BILIRUBIN TOTAL: 0.4 mg/dL (ref 0.3–1.2)
BUN: 18 mg/dL (ref 6–20)
CHLORIDE: 108 mmol/L (ref 101–111)
CO2: 25 mmol/L (ref 22–32)
Calcium: 8.9 mg/dL (ref 8.9–10.3)
Creatinine, Ser: 1.14 mg/dL (ref 0.61–1.24)
GFR calc Af Amer: >60 mL/min (ref >60)
Glucose, Bld: 105 mg/dL — ABNORMAL HIGH (ref 65–99)
POTASSIUM: 4.2 mmol/L (ref 3.5–5.1)
Sodium: 138 mmol/L (ref 135–145)
TOTAL PROTEIN: 7.4 g/dL (ref 6.5–8.1)

## 2016-08-03 LAB — ACETAMINOPHEN LEVEL

## 2016-08-03 LAB — RAPID URINE DRUG SCREEN, HOSP PERFORMED
Amphetamines: NOT DETECTED
Barbiturates: NOT DETECTED
Benzodiazepines: NOT DETECTED
COCAINE: NOT DETECTED
OPIATES: NOT DETECTED
Tetrahydrocannabinol: NOT DETECTED

## 2016-08-03 LAB — ETHANOL

## 2016-08-03 LAB — SALICYLATE LEVEL

## 2016-08-03 MED ORDER — LORAZEPAM 1 MG PO TABS: 1.0000 mg | ORAL_TABLET | Freq: Three times a day (TID) | ORAL | Status: DC | PRN

## 2016-08-03 MED ORDER — ACETAMINOPHEN 325 MG PO TABS
650.0000 mg | ORAL_TABLET | ORAL | Status: DC | PRN
  Administered 2016-08-03 – 2016-08-04 (×2): 650 mg via ORAL
  Filled 2016-08-03 (×2): qty 2

## 2016-08-03 MED ORDER — IBUPROFEN 200 MG PO TABS
600.0000 mg | ORAL_TABLET | Freq: Three times a day (TID) | ORAL | Status: DC | PRN
  Administered 2016-08-04: 600 mg via ORAL
  Filled 2016-08-03: qty 3

## 2016-08-03 MED ORDER — QUETIAPINE FUMARATE ER 400 MG PO TB24
400.0000 mg | ORAL_TABLET | Freq: Every day | ORAL | Status: DC
  Administered 2016-08-03: 400 mg via ORAL
  Filled 2016-08-03: qty 1

## 2016-08-03 MED ORDER — MONTELUKAST SODIUM 10 MG PO TABS
10.0000 mg | ORAL_TABLET | Freq: Every day | ORAL | Status: DC
  Administered 2016-08-03 – 2016-08-04 (×2): 10 mg via ORAL
  Filled 2016-08-03 (×2): qty 1
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
## 2016-08-03 NOTE — ED Notes (Signed)
Patient noted sleeping in room. No complaints, stable, in no acute distress. Q15 minute rounds and monitoring via Security Cameras to continue.  

## 2016-08-03 NOTE — ED Notes (Signed)
[facility] Expected date:  Expected time:  Means of arrival:  Comments: [employer]

## 2016-08-03 NOTE — ED Notes (Signed)
Pt wanded and belongings secured
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
## 2016-08-03 NOTE — ED Notes (Signed)
Multiple bags of belongings placed in hall closet,  All belongings wanded by security

## 2016-08-03 NOTE — ED Notes (Signed)
Pt ambulatory w/o difficulty from tcu to room 35
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
## 2016-08-03 NOTE — ED Notes (Signed)
Patient noted in room. No complaints, stable, in no acute distress. Q15 minute rounds and monitoring via Security Cameras to continue. Snack and beverage given. 

## 2016-08-03 NOTE — ED Notes (Signed)
[id number] Expected date:  Expected time:  Means of arrival:  Comments: rm 27

## 2016-08-03 NOTE — ED Notes (Signed)
Arrived alert and ambulatory to room 35. VSS. Changed scrubs and wanded with no contraband noted. Personal belongings placed in hall locker. Pt calm and cooperative.

## 2016-08-04 DIAGNOSIS — F17210 Nicotine dependence, cigarettes, uncomplicated: Secondary | ICD-10-CM | POA: Diagnosis not present

## 2016-08-04 DIAGNOSIS — Z79899 Other long term (current) drug therapy: Secondary | ICD-10-CM

## 2016-08-04 DIAGNOSIS — F250 Schizoaffective disorder, bipolar type: Secondary | ICD-10-CM

## 2016-08-04 NOTE — BHH Suicide Risk Assessment (Signed)
Suicide Risk Assessment  Discharge Assessment   [facility]   Principal Problem: Schizoaffective disorder, bipolar type [facility](HCC) Discharge Diagnoses:  Patient Active Problem List   Diagnosis Date Noted  . Schizoaffective disorder, bipolar type (HCC) [F25.0] 04/20/2012    Priority: High  . Aggressive behavior [R45.89] 06/14/2016  . Tobacco use disorder [F17.200] 04/04/2016    Total Time spent with patient: 45 minutes  Musculoskeletal: Strength & Muscle Tone: within normal limits Gait & Station: normal Patient leans: N/A  Psychiatric Specialty Exam: Physical Exam  Constitutional: He is oriented to person, place, and time. He appears well-developed and well-nourished.  HENT:  Head: Normocephalic.  Neck: Normal range of motion.  Respiratory: Effort normal.  Musculoskeletal: Normal range of motion.  Neurological: He is alert and oriented to person, place, and time.  Skin: Skin is warm and dry.  Psychiatric: He has a normal mood and affect. His speech is normal and behavior is normal. Judgment and thought content normal. Cognition and memory are normal.    Review of Systems  Constitutional: Negative.   HENT: Negative.   Eyes: Negative.   Respiratory: Negative.   Cardiovascular: Negative.   Gastrointestinal: Negative.   Genitourinary: Negative.   Musculoskeletal: Negative.   Skin: Negative.   Neurological: Negative.   Endo/Heme/Allergies: Negative.   Psychiatric/Behavioral: Negative.     Blood pressure 142/92, pulse 65, temperature 97.7 F (36.5 C), temperature source Oral, resp. rate 18, SpO2 100 %.There is no height or weight on file to calculate BMI.  General Appearance: Disheveled  Eye Contact:  Good  Speech:  Normal Rate  Volume:  Normal  Mood:  Euthymic  Affect:  Congruent  Thought Process:  Coherent and Descriptions of Associations: Intact  Orientation:  Full (Time, Place, and Person)  Thought Content:  WDL  Suicidal Thoughts:  No   Homicidal Thoughts:  No  Memory:  Immediate;   Good Recent;   Good Remote;   Good  Judgement:  Fair  Insight:  Fair  Psychomotor Activity:  Normal  Concentration:  Concentration: Good and Attention Span: Good  Recall:  Good  Fund of Knowledge:  Good  Language:  Good  Akathisia:  No  Handed:  Right  AIMS (if indicated):     Assets:  Housing Leisure Time Physical Health Resilience Social Support  ADL's:  Intact  Cognition:  WNL  Sleep:       Mental Status Per Nursing Assessment::   On Admission:   aggression   Demographic Factors:  Male  Loss Factors: NA  Historical Factors: NA  Risk Reduction Factors:   Sense of responsibility to family and Positive therapeutic relationship  Continued Clinical Symptoms:  None  Cognitive Features That Contribute To Risk:  None    Suicide Risk:  Minimal: No identifiable suicidal ideation.  Patients presenting with no risk factors but with morbid ruminations; may be classified as minimal risk based on the severity of the depressive symptoms    Plan Of Care/Follow-up recommendations:  Activity:  as tolerated Diet:  heart healthy diet  LORD, JAMISON, NP 08/04/2016, 10:18 AM

## 2016-08-04 NOTE — Discharge Instructions (Signed)
For your ongoing behavioral health needs, you are advised to continue treatment with your current outpatient providers:       [facility]      [address]      [place], [place] [address]      [phone]       [facility]      [address]      [place], [place] [address]      [phone]
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
## 2016-08-04 NOTE — ED Notes (Signed)
Patient noted sleeping in room. No complaints, stable, in no acute distress. Q15 minute rounds and monitoring via Security Cameras to continue.  

## 2016-08-04 NOTE — ED Notes (Signed)
Patient noted in room. Stable, in no acute distress. Q15 minute rounds and monitoring via Security Cameras to continue.  

## 2016-08-04 NOTE — BH Assessment (Signed)
BHH Assessment Progress Note  Per Thedore MinsMojeed Akintayo, MD, this pt does not require psychiatric hospitalization at this time.  [facility] with [facility]  This has been included in pt's discharge instructions.  Pt's nurse, Kendal Hymendie, has been notified.  Doylene Canninghomas Hines Kloss, MA Triage Specialist [phone]

## 2016-08-04 NOTE — ED Notes (Signed)
When pt was asked to sign his discharge he started yelling about "people out there trying to [place] me"  "Francesca OmanYall have to keep me,  I dont have anywhere to go.,  Francesca OmanYall have to give me a place to lay my head down, I'll be back"  He was loud and belligerent knocking on glass at nurses station saying " The law says you have to keep me. I want to be arrested or [facility]."  Security and [employer] walked patient out of unit with all of his belongings.

## 2016-08-05 ENCOUNTER — Emergency Department
Admission: EM | Admit: 2016-08-05 | Discharge: 2016-08-05 | Disposition: A | Discharge status: Other Acute Inpatient Hospital | Payer: Medicaid Other | Attending: Emergency Medicine | Admitting: Emergency Medicine

## 2016-08-05 ENCOUNTER — Encounter: Payer: Self-pay | Admitting: Emergency Medicine

## 2016-08-05 ENCOUNTER — Inpatient Hospital Stay
Admission: EM | Admit: 2016-08-05 | Discharge: 2016-08-06 | DRG: 885 | Disposition: A | Discharge status: Home / Self Care | Payer: Medicaid Other | Source: Intra-hospital | Attending: Psychiatry | Admitting: Psychiatry

## 2016-08-05 DIAGNOSIS — F17290 Nicotine dependence, other tobacco product, uncomplicated: Secondary | ICD-10-CM | POA: Diagnosis present

## 2016-08-05 DIAGNOSIS — F250 Schizoaffective disorder, bipolar type: Principal | ICD-10-CM | POA: Diagnosis present

## 2016-08-05 DIAGNOSIS — F17200 Nicotine dependence, unspecified, uncomplicated: Secondary | ICD-10-CM | POA: Diagnosis present

## 2016-08-05 DIAGNOSIS — J45909 Unspecified asthma, uncomplicated: Secondary | ICD-10-CM | POA: Insufficient documentation

## 2016-08-05 DIAGNOSIS — F203 Undifferentiated schizophrenia: Secondary | ICD-10-CM | POA: Insufficient documentation

## 2016-08-05 DIAGNOSIS — I10 Essential (primary) hypertension: Secondary | ICD-10-CM | POA: Insufficient documentation

## 2016-08-05 DIAGNOSIS — F22 Delusional disorders: Secondary | ICD-10-CM | POA: Insufficient documentation

## 2016-08-05 DIAGNOSIS — Z79899 Other long term (current) drug therapy: Secondary | ICD-10-CM | POA: Insufficient documentation

## 2016-08-05 DIAGNOSIS — R443 Hallucinations, unspecified: Secondary | ICD-10-CM

## 2016-08-05 LAB — URINALYSIS COMPLETE WITH MICROSCOPIC (ARMC ONLY)
Bacteria, UA: NONE SEEN
Bilirubin Urine: NEGATIVE
GLUCOSE, UA: NEGATIVE mg/dL
Hgb urine dipstick: NEGATIVE
KETONES UR: NEGATIVE mg/dL
Leukocytes, UA: NEGATIVE
Nitrite: NEGATIVE
PROTEIN: 30 mg/dL — AB
SQUAMOUS EPITHELIAL / LPF: NONE SEEN
Specific Gravity, Urine: 1.025 (ref 1.005–1.030)
pH: 8 (ref 5.0–8.0)

## 2016-08-05 LAB — COMPREHENSIVE METABOLIC PANEL
ALBUMIN: 4.2 g/dL (ref 3.5–5.0)
ALK PHOS: 75 U/L (ref 38–126)
ALT: 30 U/L (ref 17–63)
AST: 34 U/L (ref 15–41)
Anion gap: 7 (ref 5–15)
BILIRUBIN TOTAL: 0.3 mg/dL (ref 0.3–1.2)
BUN: 20 mg/dL (ref 6–20)
CALCIUM: 9 mg/dL (ref 8.9–10.3)
CO2: 27 mmol/L (ref 22–32)
CREATININE: 1.14 mg/dL (ref 0.61–1.24)
Chloride: 106 mmol/L (ref 101–111)
GFR calc Af Amer: >60 mL/min (ref >60)
GLUCOSE: 97 mg/dL (ref 65–99)
Potassium: 4 mmol/L (ref 3.5–5.1)
Sodium: 140 mmol/L (ref 135–145)
TOTAL PROTEIN: 7.6 g/dL (ref 6.5–8.1)

## 2016-08-05 LAB — URINE DRUG SCREEN, QUALITATIVE (ARMC ONLY)
AMPHETAMINES, UR SCREEN: NOT DETECTED
BARBITURATES, UR SCREEN: NOT DETECTED
BENZODIAZEPINE, UR SCRN: NOT DETECTED
CANNABINOID 50 NG, UR ~~LOC~~: NOT DETECTED
Cocaine Metabolite,Ur ~~LOC~~: NOT DETECTED
MDMA (Ecstasy)Ur Screen: NOT DETECTED
Methadone Scn, Ur: NOT DETECTED
OPIATE, UR SCREEN: NOT DETECTED
PHENCYCLIDINE (PCP) UR S: NOT DETECTED
Tricyclic, Ur Screen: NOT DETECTED

## 2016-08-05 LAB — CBC
HEMATOCRIT: 46.9 % (ref 40.0–52.0)
HEMOGLOBIN: 15.7 g/dL (ref 13.0–18.0)
MCH: 33 pg (ref 26.0–34.0)
MCHC: 33.4 g/dL (ref 32.0–36.0)
MCV: 99 fL (ref 80.0–100.0)
Platelets: 145 10*3/uL — ABNORMAL LOW (ref 150–440)
RBC: 4.74 MIL/uL (ref 4.40–5.90)
RDW: 13.5 % (ref 11.5–14.5)
WBC: 9.5 10*3/uL (ref 3.8–10.6)

## 2016-08-05 LAB — ETHANOL: Alcohol, Ethyl (B): <5 mg/dL (ref <5)

## 2016-08-05 MED ORDER — IBUPROFEN 600 MG PO TABS
600.0000 mg | ORAL_TABLET | Freq: Once | ORAL | Status: AC
  Administered 2016-08-05: 600 mg via ORAL
  Filled 2016-08-05: qty 1

## 2016-08-05 MED ORDER — QUETIAPINE FUMARATE ER 200 MG PO TB24
400.0000 mg | ORAL_TABLET | Freq: Every day | ORAL | Status: DC
  Administered 2016-08-05: 400 mg via ORAL
  Filled 2016-08-05: qty 2

## 2016-08-05 MED ORDER — ACETAMINOPHEN 325 MG PO TABS
650.0000 mg | ORAL_TABLET | Freq: Once | ORAL | Status: AC
  Administered 2016-08-05: 650 mg via ORAL

## 2016-08-05 MED ORDER — WHITE PETROLATUM GEL
Status: DC | PRN
  Filled 2016-08-05: qty 5

## 2016-08-05 MED ORDER — ACETAMINOPHEN 325 MG PO TABS
ORAL_TABLET | ORAL | Status: AC
  Filled 2016-08-05: qty 2

## 2016-08-05 MED ORDER — ALUM & MAG HYDROXIDE-SIMETH 200-200-20 MG/5ML PO SUSP: 30.0000 mL | ORAL | Status: DC | PRN

## 2016-08-05 MED ORDER — MONTELUKAST SODIUM 10 MG PO TABS: 10.0000 mg | ORAL_TABLET | Freq: Every day | ORAL | Status: DC

## 2016-08-05 MED ORDER — MAGNESIUM HYDROXIDE 400 MG/5ML PO SUSP: 30.0000 mL | Freq: Every day | ORAL | Status: DC | PRN

## 2016-08-05 MED ORDER — MONTELUKAST SODIUM 10 MG PO TABS
10.0000 mg | ORAL_TABLET | Freq: Every day | ORAL | Status: DC
  Administered 2016-08-05: 10 mg via ORAL
  Filled 2016-08-05 (×2): qty 1

## 2016-08-05 MED ORDER — QUETIAPINE FUMARATE ER 200 MG PO TB24
400.0000 mg | ORAL_TABLET | Freq: Every day | ORAL | Status: DC
  Filled 2016-08-05: qty 1

## 2016-08-05 MED ORDER — ACETAMINOPHEN 325 MG PO TABS
650.0000 mg | ORAL_TABLET | Freq: Four times a day (QID) | ORAL | Status: DC | PRN
Start: 2016-08-05 — End: 2016-08-06
  Administered 2016-08-05: 650 mg via ORAL
  Filled 2016-08-05: qty 2

## 2016-08-05 NOTE — ED Notes (Addendum)

## 2016-08-05 NOTE — ED Notes (Signed)
ED BHU PLACEMENT JUSTIFICATION Is the patient under [name] or is there intent for [name]:  no  Is the patient medically cleared: Yes.   Is there vacancy in the ED BHU: Yes.   Is the population mix appropriate for patient: Yes.   Is the patient awaiting placement in inpatient or outpatient setting: pending psych eval  Has the patient had a psychiatric consult:   pending Survey of unit performed for contraband, proper placement and condition of furniture, tampering with fixtures in bathroom, shower, and each patient room: Yes.  ; Findings:  APPEARANCE/BEHAVIOR Calm and cooperative NEURO ASSESSMENT Orientation: oriented x4  Denies pain Hallucinations:  Pt reports auditory hallucinations  (Hallucinations) Speech: Normal Gait: normal RESPIRATORY ASSESSMENT Even  Unlabored respirations  CARDIOVASCULAR ASSESSMENT Pulses equal   regular rate  Skin warm and dry   GASTROINTESTINAL ASSESSMENT no GI complaint EXTREMITIES Full ROM  PLAN OF CARE Provide calm/safe environment. Vital signs assessed twice daily. ED BHU Assessment once each 12-hour shift. Collaborate with TTS daily or as condition indicates. Assure the ED provider has rounded once each shift. Provide and encourage hygiene. Provide redirection as needed. Assess for escalating behavior; address immediately and inform ED provider.  Assess family dynamic and appropriateness for visitation as needed: Yes.  ; If necessary, describe findings:  Educate the patient/family about BHU procedures/visitation: Yes.  ; If necessary, describe findings:

## 2016-08-05 NOTE — ED Notes (Signed)
Dr Juluis Mireclapaas  Talking to patient

## 2016-08-05 NOTE — ED Notes (Signed)
Patient observed lying in a hallway bed with eyes closed  Even, unlabored respirations observed   NAD pt appears to be sleeping  I will continue to monitor along with every 15 minute visual observations and ongoing security monitoring    

## 2016-08-05 NOTE — ED Triage Notes (Signed)
[facility] the ED [facility] [employer] [facility] for behavioral complaints. Pt states that "he feels like someone is stabbing him in the head but doesn't know if is because of the concrete, it might be voodoo." Pt is AOx4 reporting visual and auditory hallucinations with a history of bipolar and schizophrenia.

## 2016-08-05 NOTE — ED Notes (Signed)
BEHAVIORAL HEALTH ROUNDING Patient sleeping: No. Patient alert and oriented: yes Behavior appropriate: Yes.  ; If no, describe:  Nutrition and fluids offered: yes Toileting and hygiene offered: Yes  Sitter present: q15 minute observations and security  monitoring Law enforcement present: Yes  ODS  

## 2016-08-05 NOTE — ED Notes (Signed)
Pt awake  - looking around - directing staff that other pts need something   - watching an EMS come in and talking about the other pt  -privacy curtain placed to restrict his view   NAD assessed  He denies pain

## 2016-08-05 NOTE — ED Notes (Signed)
Patient in shower 

## 2016-08-05 NOTE — ED Notes (Signed)
BEHAVIORAL HEALTH ROUNDING Patient sleeping: Yes.   Patient alert and oriented: eyes closed  Appears to be asleep Behavior appropriate: Yes.  ; If no, describe:  Nutrition and fluids offered: sleeping Toileting and hygiene offered: sleeping Sitter present: q 15 minute observations and security monitoring Law enforcement present: yes  ODS 

## 2016-08-05 NOTE — ED Notes (Signed)
BEHAVIORAL HEALTH ROUNDING Patient sleeping: No. Patient alert and oriented: yes Behavior appropriate: Yes.  ; If no, describe:  Nutrition and fluids offered: yes Toileting and hygiene offered: Yes  Sitter present: q15 minute observations and security monitoring Law enforcement present: Yes  ODS   He is currently in the BR

## 2016-08-05 NOTE — Consult Note (Signed)
Ryan York Psychiatry Consult   Reason for Consult:  Consult for 45 year old man with history of schizophrenia or schizoaffective disorder who comes in with the emergency room requesting treatment. Referring Physician:  Corky Downs Patient Identification: Ryan York Principal Diagnosis: Schizoaffective disorder, bipolar type [facility]) Diagnosis:   Patient Active Problem List   Diagnosis Date Noted  . Asthma [J45.909] 08/05/2016  . Aggressive behavior [R45.89] 06/14/2016  . Tobacco use disorder [F17.200] 04/04/2016  . Schizoaffective disorder, bipolar type ([place]) [F25.0] 04/20/2012    Total Time spent with patient: 1 hour  Subjective:   Ryan York is a 45 y.o. male patient admitted with "I'm tired of fighting".  HPI:  Patient interviewed. Chart reviewed including multiple notes from [place]. Labs and vitals reviewed. This is a 45 year old man who has a long-standing history of mental illness. He was just seen at behavioral health in [facility] yesterday and they did not feel he needed inpatient treatment. He says that he walked all the way from there to here last night because he wants to be sent to [facility]. Patient says that he is tired of fighting his mental illness. He also says that he has "voices, voices, voices." Tends to be a little histrionic in his presentation. Does say that he has taken some Seroquel although he tells me multiple times that he refuses to go back to [place] because those people won't help him. He is disorganized in his thinking. Appears to be paranoid. Says he is having hallucinations. Reports that he hasn't been sleeping or eating well. Denies that he's been drinking or abusing any drugs. Several times that he wants to go to [facility]. I explained to him that it is not that simple anymore. Patient denies having any place to live right now. There seems to be some secondary gain to his presentation but he  also seems to have a clear mental health history and to be unable to take care of himself in the current situation.  Medical history: He has mild asthma but otherwise is in good medical health as far as we can tell.  Substance abuse history: He says that he drinks at all and denies that he's using any drugs recently. He says there was some drug use back in 2008 but he hasn't had a substance abuse problem since then.  Family history: He denies being aware of any family history  Social history: He says he currently has no place to stay. We have some secondhand rumors that he may be in trouble with some people he owes money to back in [place]. He says that he doesn't want to talk about any of his situation back in [place] at all to me. Says that he wants to go to [facility] because he knows some people who live out in the country there and that would be a better environment for him.  Past Psychiatric History: Well-established diagnosis of schizophrenia or schizoaffective disorder. Also however has a history of what looks like using mental health treatment in [place] of appropriate self-care. He denies ever having tried to kill himself. He says he has fought with his fists in the past but hasn't had any predatory violence. He is currently prescribed Seroquel XR 300 mg at night. It's been suggested several times in the old notes that he be on a long-acting injectable. I'm not sure if he resists that or if it just has not been done.  Risk to Self: Suicidal Ideation: No  Suicidal Intent: No Is patient at risk for suicide?: No Suicidal Plan?: No Specify Current Suicidal Plan: None  Access to Means: No What has been your use of drugs/alcohol within the last 12 months?: Denied use of drugs or alcohol How many times?: 0 Other Self Harm Risks: denied Triggers for Past Attempts: None known Intentional Self Injurious Behavior: None Risk to Others: Homicidal Ideation: No Thoughts of Harm to Others:  No Current Homicidal Intent: No Current Homicidal Plan: No Access to Homicidal Means: No Identified Victim: None History of harm to others?: No Assessment of Violence: None Noted Does patient have access to weapons?: No Criminal Charges Pending?: No Does patient have a court date: No Prior Inpatient Therapy: Prior Inpatient Therapy: Yes Prior Therapy Dates: 2010, 2012,2017 Prior Therapy Facilty/Provider(s): [facility] Reason for Treatment: Schizoaffective DIosrder Prior Outpatient Therapy: Prior Outpatient Therapy: Yes Prior Therapy Dates: Ongoing Prior Therapy Facilty/Provider(s): [facility] Reason for Treatment: Schizoaffective Disorder Does patient have an ACCT team?: No Does patient have Intensive In-House Services?  : No Does patient have [facility] services? : No Does patient have P4CC services?: No  Past Medical History:  Past Medical History:  Diagnosis Date  . Asthma   . Bipolar disorder ([place])   . Hypertension   . Schizophrenia [facility])     Past Surgical History:  Procedure Laterality Date  . KNEE SURGERY     Family History:  Family History  Problem Relation Age of Onset  . Family history unknown: Yes   Family Psychiatric  History: He is not aware of any family history of mental illness Social History:  History  Alcohol Use  . Yes    Comment: Pt stated that he has been drinking extensively  and needs detox     History  Drug Use No    Comment: UDS not available at time of writing    Social History   Social History  . Marital status: Single    Spouse name: N/A  . Number of children: N/A  . Years of education: N/A   Social History Main Topics  . Smoking status: Current Some Day Smoker    Packs/day: 0.50    Years: 10.00    Types: Cigars  . Smokeless tobacco: Never Used  . Alcohol use Yes     Comment: Pt stated that he has been drinking extensively  and needs detox  . Drug use: No     Comment: UDS not available at time of writing  . Sexual activity: Not  Asked   Other Topics Concern  . None   Social History Narrative  . None   Additional Social History:    Allergies:   Allergies  Allergen Reactions  . Lithium Itching  . Vistaril [Hydroxyzine] Nausea Only    Severe stomach pain    Labs:  Results for orders placed or performed during the hospital encounter of 08/05/16 (from the past 48 hour(s))  CBC     Status: Abnormal   Collection Time: 08/05/16  3:30 AM  Result Value Ref Range   WBC 9.5 3.8 - 10.6 K/uL   RBC 4.74 4.40 - 5.90 MIL/uL   Hemoglobin 15.7 13.0 - 18.0 g/dL   HCT 46.9 40.0 - 52.0 %   MCV 99.0 80.0 - 100.0 fL   MCH 33.0 26.0 - 34.0 pg   MCHC 33.4 32.0 - 36.0 g/dL   RDW 13.5 11.5 - 14.5 %   Platelets 145 (L) 150 - 440 K/uL  Comprehensive metabolic panel  Status: None   Collection Time: 08/05/16  3:30 AM  Result Value Ref Range   Sodium 140 135 - 145 mmol/L   Potassium 4.0 3.5 - 5.1 mmol/L   Chloride 106 101 - 111 mmol/L   CO2 27 22 - 32 mmol/L   Glucose, Bld 97 65 - 99 mg/dL   BUN 20 6 - 20 mg/dL   Creatinine, Ser 1.14 0.61 - 1.24 mg/dL   Calcium 9.0 8.9 - 10.3 mg/dL   Total Protein 7.6 6.5 - 8.1 g/dL   Albumin 4.2 3.5 - 5.0 g/dL   AST 34 15 - 41 U/L   ALT 30 17 - 63 U/L   Alkaline Phosphatase 75 38 - 126 U/L   Total Bilirubin 0.3 0.3 - 1.2 mg/dL   GFR calc non Af Amer >60 >60 mL/min   GFR calc Af Amer >60 >60 mL/min    Comment: (NOTE) The eGFR has been calculated using the CKD EPI equation. This calculation has not been validated in all clinical situations. eGFR's persistently <60 mL/min signify possible Chronic Kidney Disease.    Anion gap 7 5 - 15  Ethanol     Status: None   Collection Time: 08/05/16  3:30 AM  Result Value Ref Range   Alcohol, Ethyl (B) <5 <5 mg/dL    Comment:        LOWEST DETECTABLE LIMIT FOR SERUM ALCOHOL IS 5 mg/dL FOR MEDICAL PURPOSES ONLY     Current Facility-Administered Medications  Medication Dose Route Frequency Provider Last Rate Last Dose  . montelukast  (SINGULAIR) tablet 10 mg  10 mg Oral QHS Gonzella Lex, MD      . QUEtiapine (SEROQUEL XR) 24 hr tablet 400 mg  400 mg Oral QHS Gonzella Lex, MD       Current Outpatient Prescriptions  Medication Sig Dispense Refill  . cyclobenzaprine (FLEXERIL) 10 MG tablet Take 1 tablet (10 mg total) by mouth 3 (three) times daily as needed for muscle spasms. 30 tablet 0  . montelukast (SINGULAIR) 10 MG tablet Take 10 mg by mouth daily.  0  . QUEtiapine (SEROQUEL XR) 400 MG 24 hr tablet Take 1 tablet (400 mg total) by mouth at bedtime. 30 tablet 0  . naproxen (NAPROSYN) 500 MG tablet Take 1 tablet (500 mg total) by mouth 2 (two) times daily. (Patient not taking: Reported on 08/05/2016) 30 tablet 0    Musculoskeletal: Strength & Muscle Tone: within normal limits Gait & Station: normal Patient leans: N/A  Psychiatric Specialty Exam: Physical Exam  ROS  Blood pressure (!) 144/83, pulse 76, temperature 97.6 F (36.4 C), temperature source Oral, resp. rate (!) 24, height '6\' 2"'$  (1.88 m), weight 95.3 kg (210 lb), SpO2 96 %.Body mass index is 26.96 kg/m.  General Appearance: Disheveled  Eye Contact:  Minimal  Speech:  Slow  Volume:  Decreased  Mood:  Dysphoric  Affect:  Restricted  Thought Process:  Disorganized  Orientation:  Full (Time, Place, and Person)  Thought Content:  Paranoid Ideation, Rumination and Tangential  Suicidal Thoughts:  No  Homicidal Thoughts:  No  Memory:  Immediate;   Good Recent;   Good Remote;   Fair  Judgement:  Impaired  Insight:  Shallow  Psychomotor Activity:  Decreased  Concentration:  Concentration: Fair  Recall:  [employer] of Knowledge:  Fair  Language:  Good  Akathisia:  Negative  Handed:  Right  AIMS (if indicated):     Assets:  Desire for Improvement Financial  Resources/Insurance Physical Health Resilience  ADL's:  Intact  Cognition:  WNL  Sleep:        Treatment Plan Summary: Daily contact with patient to assess and evaluate symptoms and  progress in treatment, Medication management and Plan 45 year old man with schizophrenia or schizoaffective disorder. Although he is not reporting any acute suicidal ideation it is pretty clear that he is unable to take care of himself in any reasonable way. This doesn't appear to be related to drug abuse but to his poor coping skills and limited cognition and probably ongoing paranoia. I think it's reasonable to admit him to the hospital and hopes that we can stabilize him and get him out of this crisis that has put him in the emergency room over and over. Patient is agreeable. Orders completed. Continuing current medicines for now with additional when necessary's. Full set of labs to be obtained.  Disposition: Recommend psychiatric Inpatient admission when medically cleared. Supportive therapy provided about ongoing stressors.  Alethia Berthold, MD 08/05/2016 1:04 PM

## 2016-08-05 NOTE — Tx Team (Signed)
Initial Treatment Plan 08/05/2016 6:17 PM Ryan York [place] RUE:[id number]    PATIENT STRESSORS: Financial difficulties Occupational concerns   PATIENT STRENGTHS: Capable of independent living Manufacturing systems engineerCommunication skills Motivation for treatment/growth Physical Health   PATIENT IDENTIFIED PROBLEMS:   "I had to get out of [place]."    Voices telling me to "fight. They trash talking. I can't think for a second. Felt like someone stabbed me in the head. I needed a break."--endorsing auditory hallucinations    "I can't function on my own."--pt is homeless           DISCHARGE CRITERIA:  Ability to meet basic life and health needs Adequate post-discharge living arrangements Improved stabilization in mood, thinking, and/or behavior Medical problems require only outpatient monitoring Verbal commitment to aftercare and medication compliance  PRELIMINARY DISCHARGE PLAN: Attend aftercare/continuing care group Outpatient therapy Placement in alternative living arrangements  PATIENT/FAMILY INVOLVEMENT: This treatment plan has been presented to and reviewed with the patient, Ryan York Speakman, and/or family member, .  The patient and family have been given the opportunity to ask questions and make suggestions.  Tonye PearsonAmanda N Berdene Askari, RN 08/05/2016, 6:17 PM

## 2016-08-05 NOTE — ED Notes (Signed)
Pt to transfer to [facility] at this time  Report given to [facility]   Pt informed  NAD assessed

## 2016-08-05 NOTE — BH Assessment (Signed)
[facility] by Dr. Toni Amendlapacs.  Attending Physician will be Dr. Jennet MaduroPucilowska.   Patient [facility] 312[facility] [place]wen F.   Intake Paper Work has been signed and placed on patient chart.  ER staff is aware of the admission Ryan York(Glenda, ER Sect.; Dr. York CeriseForbach, ER MD; Murlean IbaAmy T., Patient's Nurse & Byrd HesselbachMaria, Patient Access).

## 2016-08-05 NOTE — BH Assessment (Signed)
Assessment Note  Ryan York is an 45 y.o. male. Ryan York arrived to the ED by way of EMS.  He stated [place]  He states that he is hearing voices and he feels like it is stabbing him in his head.  He states that he feels the voices touching on him.  He states that it is trying to turn him homosexual.  He states that he get [place] poorly [place].  He states that he goes through short spurts of depression and then he snaps out of it.  He denied symptoms of anxiety.  He denied suicidal or homicidal ideation or intent.  He was very tangential and would go off on random topics.  He denied the use of alcohol or drugs.  He reports multiple life stressors at this time.  Diagnosis: schizophrenia  Past Medical History:  Past Medical History:  Diagnosis Date  . Asthma   . Bipolar disorder (HCC)   . Hypertension   . Schizophrenia [facility](HCC)     Past Surgical History:  Procedure Laterality Date  . KNEE SURGERY      Family History:  Family History  Problem Relation Age of Onset  . Family history unknown: Yes    Social History:  reports that he has been smoking Cigars.  He has a 5.00 pack-year smoking history. He has never used smokeless tobacco. He reports that he drinks alcohol. He reports that he does not use drugs.  Additional Social History:  Alcohol / Drug Use History of alcohol / drug use?: No history of alcohol / drug abuse (Patient denied use)  CIWA: CIWA-Ar BP: 132/81 Pulse Rate: 77 COWS:    Allergies:  Allergies  Allergen Reactions  . Lithium Itching  . Vistaril [Hydroxyzine] Nausea Only    Severe stomach pain    Home Medications:  (Not in a hospital admission)  OB/GYN Status:  No LMP for male patient.  General Assessment Data [facility] Assessment: In system Is this a Tele or Face-to-Face Assessment?: Face-to-Face Is this an Initial Assessment or a Re-assessment for this encounter?:  Initial Assessment Marital status: Single Maiden name: n/a Is patient pregnant?: No Pregnancy Status: No Living Arrangements: Alone Can pt return to current living arrangement?: Yes Admission Status: Voluntary Is patient capable of signing voluntary admission?: Yes Referral Source: Self/Family/Friend Insurance type: Medicaid  Medical Screening Exam [facility] Walk-in ONLY) Medical Exam completed: Yes  Crisis Care Plan Living Arrangements: Alone Legal Guardian: Other: (Self) Name of Psychiatrist: [facility] Name of Therapist: [facility]  Education Status Is patient currently in school?: No Current Grade: n/a Highest grade of school patient has completed: some college Name of school: [employer] Contact person: n/a  Risk to self with the past 6 months Suicidal Ideation: No Has patient been a risk to self within the past 6 months prior to admission? : No Suicidal Intent: No Has patient had any suicidal intent within the past 6 months prior to admission? : No Is patient at risk for suicide?: No Suicidal Plan?: No Has patient had any suicidal plan within the past 6 months prior to admission? : No Specify Current Suicidal Plan: None  Access to Means: No What has been your use of drugs/alcohol within the last 12 months?: Denied use of drugs or alcohol Previous Attempts/Gestures: No How many times?: 0 Other Self Harm Risks: denied Triggers for Past Attempts: None known Intentional Self Injurious Behavior: None Family Suicide History: No Recent  stressful life event(s): Other (Comment) (living situation) Persecutory voices/beliefs?: Yes Depression: No Depression Symptoms:  (Denied) Substance abuse history and/or treatment for substance abuse?: No Suicide prevention information given to non-admitted patients: Not applicable  Risk to Others within the past 6 months Homicidal Ideation: No Does patient have any lifetime risk of violence toward others beyond the six months prior to  admission? : No Thoughts of Harm to Others: No Current Homicidal Intent: No Current Homicidal Plan: No Access to Homicidal Means: No Identified Victim: None History of harm to others?: No Assessment of Violence: None Noted Does patient have access to weapons?: No Criminal Charges Pending?: No Does patient have a court date: No Is patient on probation?: No  Psychosis Hallucinations: Auditory, Tactile Delusions: Unspecified  Mental Status Report Appearance/Hygiene: In scrubs Eye Contact: Fair Motor Activity: Unremarkable Speech: Tangential, Rapid Level of Consciousness: Drowsy Mood: Suspicious Affect: Appropriate to circumstance Anxiety Level: None Thought Processes: Tangential, Flight of Ideas Judgement: Partial Orientation: Person, Place, Situation Obsessive Compulsive Thoughts/Behaviors: None  Cognitive Functioning Concentration: Fair Memory: Recent Intact IQ: Average Insight: Poor Impulse Control: Poor Appetite: Fair Sleep: No Change Vegetative Symptoms: None  ADLScreening [facility] Assessment Services) Patient's cognitive ability adequate to safely complete daily activities?: Yes Patient able to express need for assistance with ADLs?: Yes Independently performs ADLs?: Yes (appropriate for developmental age)  Prior Inpatient Therapy Prior Inpatient Therapy: Yes Prior Therapy Dates: 2010, 2012,2017 [facility] Reason for Treatment: Schizoaffective DIosrder  Prior Outpatient Therapy Prior Outpatient Therapy: Yes Prior Therapy Dates: Ongoing Prior Therapy Facilty/Provider(s): [facility] Reason for Treatment: Schizoaffective Disorder Does patient have an ACCT team?: No Does patient have Intensive In-House Services?  : No Does patient have [facility] services? : No Does patient have P4CC services?: No  ADL Screening (condition at time of admission) Patient's cognitive ability adequate to safely complete daily activities?: Yes Patient  able to express need for assistance with ADLs?: Yes Independently performs ADLs?: Yes (appropriate for developmental age)       Abuse/Neglect Assessment (Assessment to be complete while patient is alone) Physical Abuse: Denies Verbal Abuse: Denies Sexual Abuse: Denies Exploitation of patient/patient's resources: Denies Self-Neglect: Denies     Merchant navy officerAdvance Directives (For Healthcare) Does patient have an advance directive?: No Would patient like information on creating an advanced directive?: No - patient declined information    Additional Information 1:1 In Past 12 Months?: No CIRT Risk: No Elopement Risk: No Does patient have medical clearance?: Yes     Disposition:  Disposition Initial Assessment Completed for this Encounter: Yes Disposition of Patient: Other dispositions  On Site Evaluation by:   Reviewed with Physician:    Justice DeedsKeisha Masao Junker 08/05/2016 4:55 AM

## 2016-08-05 NOTE — Progress Notes (Signed)
[facility] in [facility]. Skin and contraband search completed with two nurses present. No skin issues noted. No contraband found. Last admission to this unit July 2017. Pt reports [place] he knew he had to get out of [place] and get help here." Reports auditory hallucinations telling him "to fight. They trash talking in my ear. I can't think for a second. I needed a break from [place]. I can't function on my own. It felt like someone was stabbing me in my head." Denies drug and alcohol use. Smoke cigars daily. Denies pain, SI/HI. Reports no support from family or friends. "I once lived in a group home, but I don't like having a roommate." Reports compliance with medications. Reports following a vegetarian diet.   Oriented to room/unit. Food/fluids provided. Support and encouragement provided with use of therapeutic communication. Safety maintained with every 15 minute checks. Will continue to monitor.

## 2016-08-05 NOTE — ED Notes (Signed)
MD Clapacs is consulting at this time

## 2016-08-05 NOTE — ED Provider Notes (Signed)
[facility] Emergency Department Provider Note   ____________________________________________   First MD Initiated Contact with Patient 08/05/16 [phone]     (approximate)  I have reviewed the triage vital signs and the nursing notes.   HISTORY  Chief Complaint Psychiatric Evaluation    HPI Ryan York is a 45 y.o. male who was brought into the hospital today by the police. The patient was picked up at a gas station. He has a history of bipolar disorder and schizophrenia. The patient has been having some hallucinations. He feels as though people are touching him as though someone stabbing him in the head. He reports that his auditory hallucinations or physical. He also feels that his hallucinations are trying to turn him homosexual. The patient states he is taking his medications but the police are concerned that he may not be taking his medications. The patient is also homeless. He has been seen at [facility] multiple times but reports that they haven't done anything for him he doesn't feel right there. He reports that his brain is too fast for the city of [place] and feels better when he isn't elements [place]. The patient reports that since no one would bring him here he decided to walk here. He reports that the last 6 days has had worse stabbing in his head. The patient rates his pain a 6 out of 10 in intensity. He denies drinking or doing any drugs. The patient reports he has been taking his medications. He denies any suicidal or homicidal ideation. The patient is here for evaluation.   Past Medical History:  Diagnosis Date  . Asthma   . Bipolar disorder (HCC)   . Hypertension   . Schizophrenia [facility](HCC)     Patient Active Problem List   Diagnosis Date Noted  . Aggressive behavior 06/14/2016  . Tobacco use disorder 04/04/2016  . Schizoaffective disorder, bipolar type (HCC) 04/20/2012    Past Surgical History:  Procedure Laterality Date  . KNEE  SURGERY      Prior to Admission medications   Medication Sig Start Date End Date Taking? Authorizing Provider  cyclobenzaprine (FLEXERIL) 10 MG tablet Take 1 tablet (10 mg total) by mouth 3 (three) times daily as needed for muscle spasms. 07/21/16  Yes Dione Boozeavid Glick, MD  montelukast (SINGULAIR) 10 MG tablet Take 10 mg by mouth daily. 07/02/16  Yes Historical Provider, MD  QUEtiapine (SEROQUEL XR) 400 MG 24 hr tablet Take 1 tablet (400 mg total) by mouth at bedtime. 07/29/16  Yes Cheri FowlerKayla Rose, PA-C  naproxen (NAPROSYN) 500 MG tablet Take 1 tablet (500 mg total) by mouth 2 (two) times daily. Patient not taking: Reported on 08/05/2016 07/21/16   Dione Boozeavid Glick, MD    Allergies Lithium and Vistaril [hydroxyzine]  Family History  Problem Relation Age of Onset  . Family history unknown: Yes    Social History Social History  Substance Use Topics  . Smoking status: Current Some Day Smoker    Packs/day: 0.50    Years: 10.00    Types: Cigars  . Smokeless tobacco: Never Used  . Alcohol use Yes     Comment: Pt stated that he has been drinking extensively  and needs detox    Review of Systems Constitutional: No fever/chills Eyes: No visual changes. ENT: No sore throat. Cardiovascular: Denies chest pain. Respiratory: Denies shortness of breath. Gastrointestinal: No abdominal pain.  No nausea, no vomiting.  No diarrhea.  No constipation. Genitourinary: Negative for dysuria. Musculoskeletal: Negative for back  pain. Skin: Negative for rash. Neurological: Head pain Psych: Auditory and visual hallucinations  10-point ROS otherwise negative.  ____________________________________________   PHYSICAL EXAM:  VITAL SIGNS: ED Triage Vitals  Enc Vitals Group     BP 08/05/16 0323 132/81     Pulse Rate 08/05/16 0323 77     Resp 08/05/16 0323 18     Temp 08/05/16 0323 97.8 F (36.6 C)     Temp Source 08/05/16 0323 Oral     SpO2 08/05/16 0323 96 %     Weight 08/05/16 0332 210 lb (95.3 kg)      Height 08/05/16 0332 6\' 2"  (1.88 m)     Head Circumference --      Peak Flow --      Pain Score 08/05/16 0332 10     Pain Loc --      Pain Edu? --      Excl. in GC? --     Constitutional: Alert and oriented. Disheveled appearing and in moderate distress. Eyes: Conjunctivae are normal. PERRL. EOMI. Head: Atraumatic. Nose: No congestion/rhinnorhea. Mouth/Throat: Mucous membranes are moist.  Oropharynx non-erythematous. Cardiovascular: Normal rate, regular rhythm. Grossly normal heart sounds.  Good peripheral circulation. Respiratory: Normal respiratory effort.  No retractions. Lungs CTAB. Gastrointestinal: Soft and nontender. No distention. Positive bowel sounds Musculoskeletal: No lower extremity tenderness nor edema.   Neurologic:  Normal speech and language.  Skin:  Skin is warm, dry and intact.  Psychiatric: Mood and affect are normal.   ____________________________________________   LABS (all labs ordered are listed, but only abnormal results are displayed)  Labs Reviewed  CBC - Abnormal; Notable for the following:       Result Value   Platelets 145 (*)    All other components within normal limits  COMPREHENSIVE METABOLIC PANEL  ETHANOL  URINE DRUG SCREEN, QUALITATIVE ([facility] ONLY)  URINALYSIS COMPLETEWITH MICROSCOPIC ([facility] ONLY)  RAPID URINE DRUG SCREEN, HOSP PERFORMED   ____________________________________________  EKG  none ____________________________________________  RADIOLOGY  none ____________________________________________   PROCEDURES  Procedure(s) performed: None  Procedures  Critical Care performed: No  ____________________________________________   INITIAL IMPRESSION / ASSESSMENT AND PLAN / ED COURSE  Pertinent labs & imaging results that were available during my care of the patient were reviewed by me and considered in my medical decision making (see chart for details).  This is a 45 year old male with a history of bipolar disorder  and schizophrenia. He is here today with hallucinations. The patient is homeless and was picked up by the police. He denies any suicidal or homicidal ideation but there is a concern as to if he is taking his medications. The patient has been seen at [facility] but reports that they have not done anything for him. I will have the patient evaluated by psych as well as TTS. I will give him some ibuprofen for his headache. The patient will be reassessed.  Clinical Course      ____________________________________________   FINAL CLINICAL IMPRESSION(S) / ED DIAGNOSES  Final diagnoses:  Hallucinations  Paranoia (HCC)      NEW MEDICATIONS STARTED DURING THIS VISIT:  New Prescriptions   No medications on file     Note:  This document was prepared using Dragon voice recognition software and may include unintentional dictation errors.    Rebecka ApleyAllison P Zawadi Aplin, MD 08/05/16 0700

## 2016-08-06 DIAGNOSIS — F250 Schizoaffective disorder, bipolar type: Principal | ICD-10-CM

## 2016-08-06 LAB — LIPID PANEL
CHOLESTEROL: 202 mg/dL — AB (ref 0–200)
HDL: 37 mg/dL — AB (ref >40)
LDL Cholesterol: 130 mg/dL — ABNORMAL HIGH (ref 0–99)
TRIGLYCERIDES: 176 mg/dL — AB (ref <150)
Total CHOL/HDL Ratio: 5.5 RATIO
VLDL: 35 mg/dL (ref 0–40)

## 2016-08-06 LAB — TSH: TSH: 1.666 u[IU]/mL (ref 0.350–4.500)

## 2016-08-06 MED ORDER — QUETIAPINE FUMARATE ER 400 MG PO TB24: 400.0000 mg | ORAL_TABLET | Freq: Every day | ORAL | 1 refills | Status: DC

## 2016-08-06 MED ORDER — NICOTINE 21 MG/24HR TD PT24: 21.0000 mg | MEDICATED_PATCH | Freq: Every day | TRANSDERMAL | Status: DC

## 2016-08-06 MED ORDER — TRAZODONE HCL 100 MG PO TABS: 100.0000 mg | ORAL_TABLET | Freq: Every day | ORAL | Status: DC

## 2016-08-06 NOTE — [facility] Group Notes (Signed)
[facility] Group Notes:  (Nursing/MHT/Case Management/Adjunct)  Date:  08/06/2016  Time:  5:13 AM  Type of Therapy:  Psychoeducational Skills  Participation Level:  Active  Participation Quality:  Appropriate  Affect:  Appropriate  Cognitive:  Appropriate  Insight:  Appropriate and Good  Engagement in Group:  Engaged  Modes of Intervention:  Discussion, Socialization and Support  Summary of Progress/Problems:  Chancy MilroyLaquanda Y Dail Meece 08/06/2016, 5:13 AM

## 2016-08-06 NOTE — Progress Notes (Signed)
Patient ID: Ryan York, male   DOB: Jul 22, 1971, 45 y.o.   MRN: [id number]  At moment of discharge pt reported he did not have a photo ID.  CSW called [facility] and spoke to representative at the [facility] who confirmed a photo ID was required for entry. CSW confirmed with the representative Debby Budndre that the hospital paperwork with pt's photo ID was sufficient for a photo ID.  CSW then stopped pt from leaving in security vehicle, re-entered the CSW's office and printed out the patient's profile with picture and provided this to the pt and reducated the pt that this would be necessary for the pt to be admitted.  CSW then called Debby Budndre back at [facility] and confirmed that this would be sufficient for entry.  Debby Budndre confirmed discharge paperwork with photo ID was sufficient.  CSW signing off.  Dorothe PeaJonathan F. Kert Shackett, LCSWA, LCAS

## 2016-08-06 NOTE — BHH Counselor (Signed)
Pt was admitted after the previous tx team mtg that the CSW was present for (on 08/05/16) and before the next scheduled tx meeting (on 08/07/16) and as such, a tx team note was not needed.  Pt also was admitted and discharged within a 24 hour period and as such, a PSA is not needed.  Dorothe PeaJonathan F. Johnnell Liou, LCSWA, LCAS  08/06/16

## 2016-08-06 NOTE — B[facility] Counselor (Signed)
Adult Comprehensive Assessment  Patient ID: Ryan York, male DOB: 05/14/1971, 45 y.o. MRN: [id number]  Information Source: Information source: Patient  Current Stressors:  Educational / Learning stressors: no issues reported Employment / Job issues: unemployed, on disability for Schizophrenia Family Relationships: didn't want family contacted, but says they get along IT trainerokay Financial / Lack of resources (include bankruptcy): fixed income of SSI, food stamps and [employer] Housing / Lack of housing: Pt left apartment because of voices and it was too small for his asthma  Physical health (include injuries & life threatening diseases): asthma, bade left leg Social relationships: lacks social support Substance abuse: none reported Bereavement / Loss: none reported  Living/Environment/Situation:  Living Arrangements: Alone (homeless)  Living conditions (as described by patient or guardian): "I left my apartment because of the voices"  How long has patient lived in current situation?: 2-3 days  What is atmosphere in current home: Chaotic  Family History:  Marital status: Separated Separated, when?: separated in 4498, hasn't seen since 1999. What types of issues is patient dealing with in the relationship?: n/a Does patient have children?: No  Childhood History:  By whom was/is the patient raised?: Both parents Description of patient's relationship with caregiver when they were a child: alright, he was very active and didn't have time to get in trouble Patient's description of current relationship with people who raised him/her: good, they just have to tolerate me now, working on it. Does patient have siblings?: Yes Number of Siblings: 1 (older brother) Description of patient's current relationship with siblings: good Did patient suffer any verbal/emotional/physical/sexual abuse as a child?: No Did patient suffer from severe childhood neglect?: No Has patient ever been  sexually abused/assaulted/raped as an adolescent or adult?: No Was the patient ever a victim of a crime or a disaster?: No Witnessed domestic violence?: No Has patient been effected by domestic violence as an adult?: No  Education:  Highest grade of school patient has completed: graduated high school, vocational classes on line Currently a student?: Yes Name of school: [employer] online classes How long has the patient attended?: unsure Learning disability?: No  Employment/Work Situation:  Employment situation: On disability Why is patient on disability: Schizophrenia How long has patient been on disability: unsure Patient's job has been impacted by current illness: No What is the longest time patient has a held a job?: 15 months 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 Has patient ever been in the Eli Lilly and Companymilitary?: No Has patient ever served in Buyer, retailcombat?: No  Financial Resources:  Surveyor, quantityinancial resources: [employer]ives SSI;Food stamps;Medicaid Does patient have a representative payee or guardian?: No  Alcohol/Substance Abuse:  What has been your use of drugs/alcohol within the last 12 months?: none reported currently, age 45-drug use If attempted suicide, did drugs/alcohol play a role in this?: No Alcohol/Substance Abuse Treatment Hx: Past Tx, Inpatient If yes, describe treatment: ADS Has alcohol/substance abuse ever caused legal problems?: Yes (age 45-possession charges, )  Social Support System:  Patient's Community Support System: Production assistant, radioGood Describe Community Support System: family, [facility] Type of faith/religion: none How does patient's faith help to cope with current illness?: n/a  Leisure/Recreation:  Leisure and Hobbies: writing poetry, collect cans, watch ESPN  Strengths/Needs:  What things does the patient do well?: I talk to people and relate to people In what areas does patient struggle / problems for patient: not having a lot of  energy  Discharge Plan:  Does patient have access to transportation?: No Plan for no  access to transportation at discharge: bus Will patient be returning to same living situation after discharge?: No Plan for living situation after discharge: wants to go to a shelter Currently receiving community mental health services: Yes (From Whom) ([facility]) Does patient have financial barriers related to discharge medications?: No  Summary/Recommendations:  Patient is a 45 year old male admitted presented to the hospital voluntarily and was admitted after [facility] without being hospitalized and walked, per the pt, to [facility] to be admitted due to auditory hallucinations.  Pt's primary diagnosis is Schizoaffective disorder, bipolar type (HCC) . Pt reports primary triggers for admission were homelessness and auditory hallucinations. Pt reports his stressors are nonexistent. Pt now denies SI/HI/AVH. Patient is now homeless in [place], [place]. Pt lists supports in the community as nonexistent. Patient will benefit from crisis stabilization, medication evaluation, group therapy, and psycho education in addition to case management for discharge planning. Patient and CSW reviewed pt's identified goals and treatment plan. Pt verbalized understanding and agreed to treatment plan. At discharge it is recommended that patient remain compliant with established plan and continue treatment.   York GriceJonathan Larue Drawdy MSW, LCSWA  08/06/2016

## 2016-08-06 NOTE — Discharge Summary (Signed)
Physician Discharge Summary Note  Patient:  Ryan York is an 45 y.o., male MRN:  [id number] DOB:  08-Feb-1971 Patient phone:  [phone] (home)  [place]   [place] [id number],  Total Time spent with patient: 1 hour  Date of Admission:  08/05/2016 Date of Discharge: 08/06/2016  Reason for Admission:  Hallucinations.  Identifying data. Ryan York is a 45 year old male of schizoaffective disorder.  Chief complaint. "I need to be out of [place]."  History of present illness. Information was obtained from the patient and the chart. The patient has a long history of psychosis and mood instability with over 30 prior psychiatric admissions by his own account. He has been in the care of [facility] [place]. He has been prescribed Seroquel. He reports good treatment compliance. He started experiencing increased auditory hallucinations that are "catching him" when he is within city limits. He went to [facility] room on multiple occasions since his discharge from our hospital in July. He feels that he can not return to [place] as there is "poison" there. According to the chart, he may be running away from someone he owns money to. He [facility] just yesterday. He was not admitted and was considered a Radiographer, therapeuticmalingerer. He [place] [facility] to be brought to out ER. He endorses auditory hallucinations and denies suicidal ideation. He demands to be [facility]. He denies any symptoms of depression, or anxiety but was rater irritable and easily agitated while at [facility]. He denies alcohol or illicit substance use.  Past psychiatric history. Multiple psychiatric admissions. He does not remember any other medications than Seroquel. He denies ever attempting suicide.  Family psychiatric history. He denies any.  Social history. He claims to be homeless. He is disabled from mental illness. He has health insurance. He  would like. In the boarding house or independent apartment. He has $60 in his accounts today.  Principal Problem: Schizoaffective disorder, bipolar type [facility](HCC) Discharge Diagnoses: Patient Active Problem List   Diagnosis Date Noted  . Asthma [J45.909] 08/05/2016  . Tobacco use disorder [F17.200] 04/04/2016  . Schizoaffective disorder, bipolar type (HCC) [F25.0] 04/20/2012   Past Medical History:  Past Medical History:  Diagnosis Date  . Asthma   . Bipolar disorder (HCC)   . Hypertension   . Schizophrenia [facility](HCC)     Past Surgical History:  Procedure Laterality Date  . KNEE SURGERY     Family History:  Family History  Problem Relation Age of Onset  . Family history unknown: Yes    Social History:  History  Alcohol Use No    Comment: denies alcohol use     History  Drug Use No    Comment: UDS not available at time of writing    Social History   Social History  . Marital status: Single    Spouse name: N/A  . Number of children: N/A  . Years of education: N/A   Social History Main Topics  . Smoking status: Current Some Day Smoker    Packs/day: 0.50    Years: 10.00    Types: Cigars  . Smokeless tobacco: Never Used  . Alcohol use No     Comment: denies alcohol use  . Drug use: No     Comment: UDS not available at time of writing  . Sexual activity: Not Asked   Other Topics Concern  . None   Social History Narrative  . None  Hospital Course:    Mr. Bollen is a 45 year old male with a history of schizophrenia admitted for worsening of psychosis and suicidal ideation in the context of homelessness.  1. Suicidal ideation. The patient denies any thoughts, intentions or plans to hurt himself or others. He is able to contract for safety.   2. Psychosis. We continued Seroquel XL 400 mg nightly.   3. Insomnia. Trazodone was available.  4. Smoking. Nicotine patch was available.  5. Metabolic syndrome monitoring. Labs were performed during his recent  hospitalization.  6. EKG. Normal sinus rhythm.   7. Disposition. The patient was discharged to [facility]. He will follow up with a local provider in [place].     Physical Findings: AIMS: Facial and Oral Movements Muscles of Facial Expression: None, normal Lips and Perioral Area: None, normal Jaw: None, normal Tongue: None, normal,Extremity Movements Upper (arms, wrists, hands, fingers): None, normal Lower (legs, knees, ankles, toes): None, normal, Trunk Movements Neck, shoulders, hips: None, normal, Overall Severity Severity of abnormal movements (highest score from questions above): None, normal Incapacitation due to abnormal movements: None, normal Patient's awareness of abnormal movements (rate only patient's report): No Awareness, Dental Status Current problems with teeth and/or dentures?: No Does patient usually wear dentures?: No  CIWA:    COWS:     Musculoskeletal: Strength & Muscle Tone: within normal limits Gait & Station: normal Patient leans: N/A  Psychiatric Specialty Exam: Physical Exam  Nursing note and vitals reviewed.   Review of Systems  Psychiatric/Behavioral: Positive for hallucinations.  All other systems reviewed and are negative.   Blood pressure 122/82, pulse 65, temperature 98.2 F (36.8 C), temperature source Oral, resp. rate 18, height 6\' 2"  (1.88 m), weight 91.6 kg (202 lb), SpO2 100 %.Body mass index is 25.94 kg/m.  General Appearance: Casual  Eye Contact:  Good  Speech:  Clear and Coherent  Volume:  Normal  Mood:  Anxious  Affect:  Appropriate  Thought Process:  Goal Directed and Descriptions of Associations: Tangential  Orientation:  Full (Time, Place, and Person)  Thought Content:  Delusions, Hallucinations: Auditory and Paranoid Ideation  Suicidal Thoughts:  No  Homicidal Thoughts:  No  Memory:  Immediate;   Fair Recent;   Fair Remote;   Fair  Judgement:  Impaired  Insight:  Shallow  Psychomotor Activity:  Normal   Concentration:  Concentration: Fair and Attention Span: Fair  Recall:  [employer] of Knowledge:  Fair  Language:  Fair  Akathisia:  No  Handed:  Right  AIMS (if indicated):     Assets:  Communication Skills Desire for Improvement Financial Resources/Insurance Physical Health Resilience  ADL's:  Intact  Cognition:  WNL  Sleep:  Number of Hours: 4.5     Have you used any form of tobacco in the last 30 days? (Cigarettes, Smokeless Tobacco, Cigars, and/or Pipes): Yes  Has this patient used any form of tobacco in the last 30 days? (Cigarettes, Smokeless Tobacco, Cigars, and/or Pipes) Yes, Yes, A prescription for an FDA-approved tobacco cessation medication was offered at discharge and the patient refused  Blood Alcohol level:  Lab Results  Component Value Date   [facility] <5 08/05/2016   ETH <5 08/03/2016    Metabolic Disorder Labs:  Lab Results  Component Value Date   HGBA1C 5.8 04/05/2016   Lab Results  Component Value Date   PROLACTIN 31.3 (H) 04/05/2016   Lab Results  Component Value Date   CHOL 202 (H) 08/06/2016   TRIG 176 (  H) 08/06/2016   HDL 37 (L) 08/06/2016   CHOLHDL 5.5 08/06/2016   VLDL 35 08/06/2016   LDLCALC 130 (H) 08/06/2016   LDLCALC 105 (H) 04/05/2016    See Psychiatric Specialty Exam and Suicide Risk Assessment completed by Attending Physician prior to discharge.  Discharge destination:  Other:  Homeless Shelter.  Is patient on multiple antipsychotic therapies at discharge:  No   Has Patient had three or more failed trials of antipsychotic monotherapy by history:  No  Recommended Plan for Multiple Antipsychotic Therapies: NA  Discharge Instructions    Diet - low sodium heart healthy    Complete by:  As directed    Increase activity slowly    Complete by:  As directed        Medication List    STOP taking these medications   cyclobenzaprine 10 MG tablet Commonly known as:  FLEXERIL   naproxen 500 MG tablet Commonly known as:   NAPROSYN     TAKE these medications     Indication  montelukast 10 MG tablet Commonly known as:  SINGULAIR Take 10 mg by mouth daily.  Indication:  Hayfever   QUEtiapine 400 MG 24 hr tablet Commonly known as:  SEROQUEL XR Take 1 tablet (400 mg total) by mouth at bedtime.  Indication:  Schizophrenia        Follow-up recommendations:  Activity:  as tolerated. Diet:  low sodium heart healthy. Other:  keep follow up appointments.  Comments:    Signed: Kristine LineaJolanta Asah Lamay, MD 08/06/2016, 11:37 AM
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
## 2016-08-06 NOTE — Progress Notes (Signed)
D: Pt is calm and cooperative this evening. He c/o h/a and requests PRN medication appropriately. Pt denies SI/HI/VH. He does report auditory hallucinations and states "they're not even talking they just like to inflict pain." Pt blames these voices for his h/a. A: Emotional support and encouragement provided. Medications administered with education. q15 minute safety checks maintained. R: Pt remains free from harm. Will continue to monitor.

## 2016-08-06 NOTE — [facility] Suicide Risk Assessment (Signed)
[facility] INPATIENT:  Family/Significant Other Suicide Prevention Education  Suicide Prevention Education:  Patient Refusal for Family/Significant Other Suicide Prevention Education: The patient Ryan York has refused to provide written consent for family/significant other to be provided Family/Significant Other Suicide Prevention Education during admission and/or prior to discharge.  Physician notified.   Pt refused SPE from then CSW.  Dorothe PeaJonathan F Aryaman Haliburton 08/06/2016, 3:03 PM

## 2016-08-06 NOTE — Plan of Care (Signed)
Problem: Safety: Goal: Periods of time without injury will increase Outcome: Progressing Pt remains free from harm.  Problem: Education: Goal: Will be free of psychotic symptoms Outcome: Not Progressing Pt continues to report auditory hallucinations. He states "they're not even talking they just like to inflict pain." Pt blames these voices for his headache.

## 2016-08-06 NOTE — [facility] Suicide Risk Assessment (Signed)
[facility]   Principal Problem: Schizoaffective disorder, bipolar type [facility](HCC) Discharge Diagnoses:  Patient Active Problem List   Diagnosis Date Noted  . Asthma [J45.909] 08/05/2016  . Tobacco use disorder [F17.200] 04/04/2016  . Schizoaffective disorder, bipolar type (HCC) [F25.0] 04/20/2012    Total Time spent with patient: 1 hour  Musculoskeletal: Strength & Muscle Tone: within normal limits Gait & Station: normal Patient leans: N/A  Psychiatric Specialty Exam: Review of Systems  Psychiatric/Behavioral: Positive for hallucinations.  All other systems reviewed and are negative.   Blood pressure 122/82, pulse 65, temperature 98.2 F (36.8 C), temperature source Oral, resp. rate 18, height 6\' 2"  (1.88 m), weight 91.6 kg (202 lb), SpO2 100 %.Body mass index is 25.94 kg/m.  General Appearance: Casual  Eye Contact::  Good  Speech:  Clear and Coherent409  Volume:  Normal  Mood:  Anxious  Affect:  Appropriate  Thought Process:  Goal Directed and Descriptions of Associations: Tangential  Orientation:  Full (Time, Place, and Person)  Thought Content:  Hallucinations: Auditory  Suicidal Thoughts:  No  Homicidal Thoughts:  No  Memory:  Immediate;   Fair Recent;   Fair Remote;   Fair  Judgement:  Impaired  Insight:  Shallow  Psychomotor Activity:  Normal  Concentration:  Fair  Recall:  [employer]Fair  Fund of Knowledge:Fair  Language: Fair  Akathisia:  No  Handed:  Right  AIMS (if indicated):     Assets:  Communication Skills Desire for Improvement Financial Resources/Insurance Physical Health Resilience  Sleep:  Number of Hours: 4.5  Cognition: WNL  ADL's:  Intact   Mental Status Per Nursing Assessment::   On Admission:     Demographic Factors:  Male and Low socioeconomic status  Loss Factors: NA  Historical Factors: Family history of mental illness or substance abuse and Impulsivity  Risk Reduction Factors:   Sense of responsibility to  family  Continued Clinical Symptoms:  Schizophrenia:   Paranoid or undifferentiated type  Cognitive Features That Contribute To Risk:  None    Suicide Risk:  Minimal: No identifiable suicidal ideation.  Patients presenting with no risk factors but with morbid ruminations; may be classified as minimal risk based on the severity of the depressive symptoms    Plan Of Care/Follow-up recommendations:  Activity:  as tolerated. Diet:  low sodium heart healthy. Other:  keep follow up appointments.  Kristine LineaJolanta Tauren Delbuono, MD 08/06/2016, 11:31 AM

## 2016-08-06 NOTE — H&P (Addendum)
Psychiatric Admission Assessment Adult  Patient Identification: Ryan York MRN:  [id number] Date of Evaluation:  08/06/2016 Chief Complaint:  Schizophrenia Principal Diagnosis: Schizoaffective disorder, bipolar type (HCC) Diagnosis:   Patient Active Problem List   Diagnosis Date Noted  . Asthma [J45.909] 08/05/2016  . Tobacco use disorder [F17.200] 04/04/2016  . Schizoaffective disorder, bipolar type (HCC) [F25.0] 04/20/2012   History of Present Illness:   Identifying data. Mr. Ryan York is a 45 year old male of schizoaffective disorder.  Chief complaint. "I need to be out of [place]."  History of present illness. Information was obtained from the patient and the chart. The patient has a long history of psychosis and mood instability with over 30 prior psychiatric admissions by his own account. He has been in the care of [employer] in [place]. He has been prescribed Seroquel. He reports good treatment compliance. He started experiencing increased auditory hallucinations that are "catching him" when he is within city limits. He went to [facility] room on multiple occasions since his discharge from our hospital in July. He feels that he can not return to [place] as there is "poison" there. According to the chart, he may be running away from someone he owns money to. He was at [facility] ER just yesterday. He was not admitted and was considered a Radiographer, therapeutic. He walked to [facility] line to be brought to out ER. He endorses auditory hallucinations and denies suicidal ideation. He demands to be send to [facility]. He denies any symptoms of depression, or anxiety but was rater irritable and easily agitated while at [facility]. He denies alcohol or illicit substance use.  Past psychiatric history. Multiple psychiatric admissions. He does not remember any other medications than Seroquel. He denies ever attempting suicide.  Family psychiatric history.  He denies any.  Social history. He claims to be homeless. He is disabled from mental illness. He has health insurance. He would like. In the boarding house or independent apartment. He has $60 in his accounts today.  Total Time spent with patient: 1 hour  Is the patient at risk to self? No.  Has the patient been a risk to self in the past 6 months? No.  Has the patient been a risk to self within the distant past? No.  Is the patient a risk to others? No.  Has the patient been a risk to others in the past 6 months? No.  Has the patient been a risk to others within the distant past? No.   Prior Inpatient Therapy:   Prior Outpatient Therapy:    Alcohol Screening: 1. How often do you have a drink containing alcohol?: Monthly or less 2. How many drinks containing alcohol do you have on a typical day when you are drinking?: 1 or 2 3. How often do you have six or more drinks on one occasion?: Never Preliminary Score: 0 4. How often during the last year have you found that you were not able to stop drinking once you had started?: Never 5. How often during the last year have you failed to do what was normally expected from you becasue of drinking?: Never 6. How often during the last year have you needed a first drink in the morning to get yourself going after a heavy drinking session?: Never 7. How often during the last year have you had a feeling of guilt of remorse after drinking?: Never 8. How often during the last year have you been unable to remember what happened  the night before because you had been drinking?: Never 9. Have you or someone else been injured as a result of your drinking?: No 10. Has a relative or friend or a doctor or another health worker been concerned about your drinking or suggested you cut down?: No Alcohol Use Disorder Identification Test Final Score (AUDIT): 1 Brief Intervention: AUDIT score less than 7 or less-screening does not suggest unhealthy drinking-brief  intervention not indicated Substance Abuse History in the last 12 months:  Yes.   Consequences of Substance Abuse: Negative Previous Psychotropic Medications: Yes  Psychological Evaluations: No  Past Medical History:  Past Medical History:  Diagnosis Date  . Asthma   . Bipolar disorder (HCC)   . Hypertension   . Schizophrenia [facility](HCC)     Past Surgical History:  Procedure Laterality Date  . KNEE SURGERY     Family History:  Family History  Problem Relation Age of Onset  . Family history unknown: Yes   Tobacco Screening: Have you used any form of tobacco in the last 30 days? (Cigarettes, Smokeless Tobacco, Cigars, and/or Pipes): Yes Tobacco use, Select all that apply: cigar use daily Are you interested in Tobacco Cessation Medications?: No, patient refused Counseled patient on smoking cessation including recognizing danger situations, developing coping skills and basic information about quitting provided: Refused/Declined practical counseling Social History:  History  Alcohol Use No    Comment: denies alcohol use     History  Drug Use No    Comment: UDS not available at time of writing    Additional Social History:      History of alcohol / drug use?: No history of alcohol / drug abuse                    Allergies:   Allergies  Allergen Reactions  . Lithium Itching  . Vistaril [Hydroxyzine] Nausea Only    Severe stomach pain   Lab Results:  Results for orders placed or performed during the hospital encounter of 08/05/16 (from the past 48 hour(s))  Lipid panel     Status: Abnormal   Collection Time: 08/06/16  6:57 AM  Result Value Ref Range   Cholesterol 202 (H) 0 - 200 mg/dL   Triglycerides 161176 (H) <150 mg/dL   HDL 37 (L) >09>40 mg/dL   Total CHOL/HDL Ratio 5.5 RATIO   VLDL 35 0 - 40 mg/dL   LDL Cholesterol 604130 (H) 0 - 99 mg/dL    Comment:        Total Cholesterol/HDL:CHD Risk Coronary Heart Disease Risk Table                     Men   Women  1/2  Average Risk   3.4   3.3  Average Risk       5.0   4.4  2 X Average Risk   9.6   7.1  3 X Average Risk  23.4   11.0        Use the calculated Patient Ratio above and the CHD Risk Table to determine the patient's CHD Risk.        ATP III CLASSIFICATION (LDL):  <100     mg/dL   Optimal  540-981100-129  mg/dL   Near or Above                    Optimal  130-159  mg/dL   Borderline  191-478160-189  mg/dL   High  >295>190  mg/dL   Very High   TSH     Status: None   Collection Time: 08/06/16  6:57 AM  Result Value Ref Range   TSH 1.666 0.350 - 4.500 uIU/mL    Comment: Performed by a 3rd Generation assay with a functional sensitivity of <=0.01 uIU/mL.    Blood Alcohol level:  Lab Results  Component Value Date   [facility] 08/05/2016   ETH <5 08/03/2016    Metabolic Disorder Labs:  Lab Results  Component Value Date   HGBA1C 5.8 04/05/2016   Lab Results  Component Value Date   PROLACTIN 31.3 (H) 04/05/2016   Lab Results  Component Value Date   CHOL 202 (H) 08/06/2016   TRIG 176 (H) 08/06/2016   HDL 37 (L) 08/06/2016   CHOLHDL 5.5 08/06/2016   VLDL 35 08/06/2016   LDLCALC 130 (H) 08/06/2016   LDLCALC 105 (H) 04/05/2016    Current Medications: Current Facility-Administered Medications  Medication Dose Route Frequency Provider Last Rate Last Dose  . acetaminophen (TYLENOL) tablet 650 mg  650 mg Oral Q6H PRN Audery AmelJohn T Clapacs, MD   650 mg at 08/05/16 2121  . alum & mag hydroxide-simeth (MAALOX/MYLANTA) 200-200-20 MG/5ML suspension 30 mL  30 mL Oral Q4H PRN Audery AmelJohn T Clapacs, MD      . magnesium hydroxide (MILK OF MAGNESIA) suspension 30 mL  30 mL Oral Daily PRN Audery AmelJohn T Clapacs, MD      . montelukast (SINGULAIR) tablet 10 mg  10 mg Oral QHS Audery AmelJohn T Clapacs, MD   10 mg at 08/05/16 2121  . nicotine (NICODERM CQ - dosed in mg/24 hours) patch 21 mg  21 mg Transdermal Daily Makhari Dovidio B Wrenley Sayed, MD      . QUEtiapine (SEROQUEL XR) 24 hr tablet 400 mg  400 mg Oral QHS Audery AmelJohn T Clapacs, MD   400 mg at  08/05/16 2121  . traZODone (DESYREL) tablet 100 mg  100 mg Oral QHS Kelaiah Escalona B Merary Garguilo, MD      . white petrolatum (VASELINE) gel   Topical PRN Shari ProwsJolanta B Deitra Craine, MD       PTA Medications: Prescriptions Prior to Admission  Medication Sig Dispense Refill Last Dose  . cyclobenzaprine (FLEXERIL) 10 MG tablet Take 1 tablet (10 mg total) by mouth 3 (three) times daily as needed for muscle spasms. 30 tablet 0 08/04/2016 at Unknown time  . montelukast (SINGULAIR) 10 MG tablet Take 10 mg by mouth daily.  0 08/04/2016 at Unknown time  . naproxen (NAPROSYN) 500 MG tablet Take 1 tablet (500 mg total) by mouth 2 (two) times daily. (Patient not taking: Reported on 08/05/2016) 30 tablet 0 Not Taking at Unknown time  . QUEtiapine (SEROQUEL XR) 400 MG 24 hr tablet Take 1 tablet (400 mg total) by mouth at bedtime. 30 tablet 0 08/04/2016 at Unknown time    Musculoskeletal: Strength & Muscle Tone: within normal limits Gait & Station: normal Patient leans: N/A  Psychiatric Specialty Exam: I reviewed physical exam performed in the emergency room and agree with the findings. Physical Exam  Nursing note and vitals reviewed.   Review of Systems  Psychiatric/Behavioral: Positive for hallucinations.  All other systems reviewed and are negative.   Blood pressure 122/82, pulse 65, temperature 98.2 F (36.8 C), temperature source Oral, resp. rate 18, height 6\' 2"  (1.88 m), weight 91.6 kg (202 lb), SpO2 100 %.Body mass index is 25.94 kg/m.  See SRA.  Sleep:  Number of Hours: 4.5    Treatment Plan Summary: Daily contact with patient to assess and evaluate symptoms and progress in treatment and Medication management   Mr. Ebron is a 45 year old male with a history of schizophrenia admitted for worsening of psychosis and suicidal ideation in the context of homelessness.  1. Suicidal ideation. The patient denies andy thoughts, intentions  or plans to hurt himself or others. He is able to contract for safety.   2. Psychosis. We continue Seroquel XL 400 mg nightly.   3. Insomnia. We'll give trazodone.  4. Smoking. Nicotine patch is available.  5. Metabolic syndrome monitoring. Labs were performed during his recent hospitalization.  6. EKG. Normal sinus rhythm.   7. Disposition. The patient refuses to go back to [place] but agrees to try Homeless Shelter in [place].    Observation Level/Precautions:  15 minute checks  Laboratory:  CBC Chemistry Profile UDS UA  Psychotherapy:    Medications:    Consultations:    Discharge Concerns:    Estimated LOS:  Other:     Physician Treatment Plan for Primary Diagnosis: Schizoaffective disorder, bipolar type (HCC) Long Term Goal(s): Improvement in symptoms so as ready for discharge  Short Term Goals: Ability to identify changes in lifestyle to reduce recurrence of condition will improve, Ability to verbalize feelings will improve, Ability to disclose and discuss suicidal ideas, Ability to demonstrate self-control will improve, Ability to identify and develop effective coping behaviors will improve, Ability to maintain clinical measurements within normal limits will improve and Compliance with prescribed medications will improve  Physician Treatment Plan for Secondary Diagnosis: Principal Problem:   Schizoaffective disorder, bipolar type (HCC) Active Problems:   Tobacco use disorder  Long Term Goal(s): NA  Short Term Goals: NA  I certify that inpatient services furnished can reasonably be expected to improve the patient's condition.    [name], MD 11/8/20179:40 AM

## 2016-08-06 NOTE — [facility] Suicide Risk Assessment (Addendum)
[facility]   Nursing information obtained from:    Demographic factors:    Current Mental Status:    Loss Factors:    Historical Factors:    Risk Reduction Factors:     Total Time spent with patient: 1 hour Principal Problem: Schizoaffective disorder, bipolar type (HCC) Diagnosis:   Patient Active Problem List   Diagnosis Date Noted  . Asthma [J45.909] 08/05/2016  . Tobacco use disorder [F17.200] 04/04/2016  . Schizoaffective disorder, bipolar type (HCC) [F25.0] 04/20/2012   Subjective Data: psychotic breake, suicidal ideation.  Continued Clinical Symptoms:  Alcohol Use Disorder Identification Test Final Score (AUDIT): 1 The "Alcohol Use Disorders Identification Test", Guidelines for Use in Primary Care, Second Edition.  World Science writerHealth Organization [facility](WHO). Score between 0-7:  no or low risk or alcohol related problems. Score between 8-15:  moderate risk of alcohol related problems. Score between 16-19:  high risk of alcohol related problems. Score 20 or above:  warrants further diagnostic evaluation for alcohol dependence and treatment.   CLINICAL FACTORS:   Schizophrenia:   Depressive state Paranoid or undifferentiated type   Musculoskeletal: Strength & Muscle Tone: within normal limits Gait & Station: normal Patient leans: N/A  Psychiatric Specialty Exam: Physical Exam  Nursing note and vitals reviewed.   Review of Systems  Psychiatric/Behavioral: Positive for depression, hallucinations and suicidal ideas.  All other systems reviewed and are negative.   Blood pressure 122/82, pulse 65, temperature 98.2 F (36.8 C), temperature source Oral, resp. rate 18, height 6\' 2"  (1.88 m), weight 91.6 kg (202 lb), SpO2 100 %.Body mass index is 25.94 kg/m.  General Appearance: Bizarre  Eye Contact:  Good  Speech:  Clear and Coherent  Volume:  Normal  Mood:  Anxious  Affect:  Blunt  Thought Process:  Goal Directed and Descriptions of Associations:  Tangential  Orientation:  Full (Time, Place, and Person)  Thought Content:  Delusions, Hallucinations: Auditory and Paranoid Ideation  Suicidal Thoughts:  No  Homicidal Thoughts:  No  Memory:  Immediate;   Fair Recent;   Fair Remote;   Fair  Judgement:  Poor  Insight:  Lacking  Psychomotor Activity:  Decreased  Concentration:  Concentration: Fair and Attention Span: Fair  Recall:  FiservFair  Fund of Knowledge:  Fair  Language:  Fair  Akathisia:  No  Handed:  Right  AIMS (if indicated):     Assets:  Communication Skills Desire for Improvement Financial Resources/Insurance Physical Health Resilience  ADL's:  Intact  Cognition:  WNL  Sleep:  Number of Hours: 4.5      COGNITIVE FEATURES THAT CONTRIBUTE TO RISK:  None    SUICIDE RISK:   Moderate:  Frequent suicidal ideation with limited intensity, and duration, some specificity in terms of plans, no associated intent, good self-control, limited dysphoria/symptomatology, some risk factors present, and identifiable protective factors, including available and accessible social support.   PLAN OF CARE: Hospital admission, medication management, discharge planning.  Mr. Ryan York is a 45 year old male with a history of schizophrenia admitted for worsening of psychosis and suicidal ideation in the context of homelessness.  1. Suicidal ideation. The patient is able to contract for safety in the hospital.  2. Psychosis. We continue Seroquel XL 400 mg nightly.  3. Insomnia. We'll give trazodone.  4. Smoking. Nicotine patch is available.  5. Metabolic syndrome monitoring. Labs were performed during his recent hospitalization.  6. EKG. Pending.  7. Disposition. To be established. The patient refuses to go back to [place].  Last time he did not [facility] to [facility]. He is open to placement in a group home.  I certify that inpatient services furnished can reasonably be expected to improve the patient's condition.  Kristine LineaJolanta  Emy Angevine, MD 08/06/2016, 9:30 AM

## 2016-08-06 NOTE — Progress Notes (Signed)
Recreation Therapy Notes  Date: 11.08.17 Time: 9:30 am Location: Craft Room  Group Topic: Self-esteem  Goal Area(s) Addresses:  Patient will write at least one positive trait about self. Patient will verbalize benefit of having a healthy self-esteem.  Behavioral Response: Attentive, Interactive  Intervention: I Am  Activity: Patients were given a worksheet with the letter I on it and were instructed to write as many positive traits about themselves inside the letter.  Education: LRT educated patients on ways they can increase their self-esteem.  Education Outcome: In group clarification offered  Clinical Observations/Feedback: Patient wrote positive traits about self. Patient contributed to group discussion by stating it was easy to think of positive traits, but it was difficult to fill the whole worksheet.  Jacquelynn CreeGreene,Armida Vickroy M, LRT/CTRS 08/06/2016 11:58 AM

## 2016-08-06 NOTE — Progress Notes (Signed)
Denies SI/HI/AVH. Discharge instructions given, verbalized understanding.  Prescriptions and seven day supply of medications given.  Personal belongings returned.  Escorted off unit by this Clinical research associatewriter to doctors on  call to meet security to be transported to bus stop.

## 2016-08-06 NOTE — Progress Notes (Signed)
  [facility] :  Will you be returning to the same living situation after discharge:  No. [facility] into [facility] shelter At discharge, do you have transportation home?: Yes,  pt will be provided with a bus pass Do you have the ability to pay for your medications: Yes,  pt will be provided with prescriptions at discharge  Release of information consent forms completed and in the chart;  Patient's signature needed at discharge.  Patient to Follow up at: Follow-up Information    [facility] Follow [facility].   Why:  Please arrive to the walk-in clinic betrween that hours of 8am-7pm  Monday-Thursday and Friday between 8am-3pm and Saturday 9am-12pm for medication management and to be assessed for therapy referrals.  Please let them know you are at [facility] Contact information: [facility] [address] [place], [place]  [phone] Fax: [phone]        [facility] Follow [facility].   Why:  [facility] to the [facility] for [facility] with your hospital discharge paperwork to be admitted into long-term residential substance abuse treatment. Contact information: [facility] [address] [place], [place] [address] [phone] Fax: [phone]          Next level of care provider has access to [facility] Link:no  Safety Planning and Suicide Prevention discussed: No,  pt refused.  Have you used any form of tobacco in the last 30 days? (Cigarettes, Smokeless Tobacco, Cigars, and/or Pipes): Yes  Has patient been referred to the Quitline?: Patient refused referral  Patient has been referred for addiction treatment: Pt. refused referral  Ryan PeaJonathan F York Janes 08/06/2016, 3:04 PM

## 2016-08-06 NOTE — Tx Team (Signed)
Pt was admitted after the previous tx team mtg that the CSW was present for (on 08/05/16) and before the next scheduled tx meeting (on 08/07/16) and as such, a tx team note was not needed.  Pt also was admitted and discharged within a 24 hour period and as such, a PSA is not needed.  Christalynn Boise F. Kenzey Birkland, LCSWA, LCAS  08/06/16  

## 2016-08-07 LAB — PROLACTIN: Prolactin: 16.8 ng/mL — ABNORMAL HIGH (ref 4.0–15.2)

## 2016-08-07 LAB — HEMOGLOBIN A1C
HEMOGLOBIN A1C: 5.8 % — AB (ref 4.8–5.6)
Mean Plasma Glucose: 120 mg/dL

## 2017-04-06 ENCOUNTER — Emergency Department (HOSPITAL_COMMUNITY)
Admission: EM | Admit: 2017-04-06 | Discharge: 2017-04-07 | Disposition: A | Discharge status: Home / Self Care | Payer: Medicaid Other | Attending: Emergency Medicine | Admitting: Emergency Medicine

## 2017-04-06 ENCOUNTER — Encounter (HOSPITAL_COMMUNITY): Payer: Self-pay | Admitting: Emergency Medicine

## 2017-04-06 DIAGNOSIS — F17290 Nicotine dependence, other tobacco product, uncomplicated: Secondary | ICD-10-CM | POA: Insufficient documentation

## 2017-04-06 DIAGNOSIS — I10 Essential (primary) hypertension: Secondary | ICD-10-CM | POA: Diagnosis not present

## 2017-04-06 DIAGNOSIS — Z79899 Other long term (current) drug therapy: Secondary | ICD-10-CM | POA: Insufficient documentation

## 2017-04-06 DIAGNOSIS — Z8659 Personal history of other mental and behavioral disorders: Secondary | ICD-10-CM | POA: Diagnosis not present

## 2017-04-06 DIAGNOSIS — J45909 Unspecified asthma, uncomplicated: Secondary | ICD-10-CM | POA: Insufficient documentation

## 2017-04-06 DIAGNOSIS — R440 Auditory hallucinations: Secondary | ICD-10-CM

## 2017-04-06 DIAGNOSIS — F250 Schizoaffective disorder, bipolar type: Secondary | ICD-10-CM | POA: Diagnosis present

## 2017-04-06 NOTE — ED Triage Notes (Signed)
Pt brought in by [employer] voluntarily  Pt states he is hearing voices and seeing stuff placed in his brain by the white people who control the artificial intelligence  Pt states he takes his medication  Last took it today  Pt states he is having some testicle pain because they are squeezing on them and he states he does not like "the homosexuals" touching him

## 2017-04-06 NOTE — ED Notes (Signed)
Pt given sandwich and sprite.  

## 2017-04-07 LAB — COMPREHENSIVE METABOLIC PANEL
ALBUMIN: 3.8 g/dL (ref 3.5–5.0)
ALT: 17 U/L (ref 17–63)
AST: 19 U/L (ref 15–41)
Alkaline Phosphatase: 56 U/L (ref 38–126)
Anion gap: 7 (ref 5–15)
BUN: 11 mg/dL (ref 6–20)
CHLORIDE: 105 mmol/L (ref 101–111)
CO2: 29 mmol/L (ref 22–32)
Calcium: 9 mg/dL (ref 8.9–10.3)
Creatinine, Ser: 1.19 mg/dL (ref 0.61–1.24)
GFR calc non Af Amer: >60 mL/min (ref >60)
GLUCOSE: 85 mg/dL (ref 65–99)
POTASSIUM: 3.9 mmol/L (ref 3.5–5.1)
SODIUM: 141 mmol/L (ref 135–145)
Total Bilirubin: 0.5 mg/dL (ref 0.3–1.2)
Total Protein: 7.1 g/dL (ref 6.5–8.1)

## 2017-04-07 LAB — RAPID URINE DRUG SCREEN, HOSP PERFORMED
AMPHETAMINES: NOT DETECTED
BENZODIAZEPINES: NOT DETECTED
Barbiturates: NOT DETECTED
Cocaine: NOT DETECTED
Opiates: NOT DETECTED
TETRAHYDROCANNABINOL: NOT DETECTED

## 2017-04-07 LAB — CBC
HCT: 39.1 % (ref 39.0–52.0)
HEMOGLOBIN: 14.2 g/dL (ref 13.0–17.0)
MCH: 33.7 pg (ref 26.0–34.0)
MCHC: 36.3 g/dL — ABNORMAL HIGH (ref 30.0–36.0)
MCV: 92.9 fL (ref 78.0–100.0)
Platelets: 162 10*3/uL (ref 150–400)
RBC: 4.21 MIL/uL — ABNORMAL LOW (ref 4.22–5.81)
RDW: 13.9 % (ref 11.5–15.5)
WBC: 7.1 10*3/uL (ref 4.0–10.5)

## 2017-04-07 LAB — ETHANOL: Alcohol, Ethyl (B): <5 mg/dL (ref <5)

## 2017-04-07 NOTE — BHH Suicide Risk Assessment (Signed)
  [facility]   Principal Problem: Schizoaffective disorder, bipolar type [facility](HCC) Discharge Diagnoses:  Patient Active Problem List   Diagnosis Date Noted  . Asthma [J45.909] 08/05/2016  . Tobacco use disorder [F17.200] 04/04/2016  . Schizoaffective disorder, bipolar type (HCC) [F25.0] 04/20/2012    Total Time spent with patient: 30 minutes  Musculoskeletal: Strength & Muscle Tone: within normal limits Gait & Station: normal Patient leans: N/A  Psychiatric Specialty Exam:   Blood pressure (!) 144/87, pulse 68, temperature 97.7 F (36.5 C), temperature source Oral, resp. rate 18, height 6\' 1"  (1.854 m), weight 99.8 kg (220 lb), SpO2 97 %.Body mass index is 29.03 kg/m.  General Appearance: Casual and Fairly Groomed  Patent attorneyye Contact::  Fair  Speech:  Clear and Coherent and Normal Rate409  Volume:  Normal  Mood:  Euthymic  Affect:  Appropriate and Congruent  Thought Process:  Coherent, Goal Directed, Linear and Descriptions of Associations: Intact  Orientation:  Full (Time, Place, and Person)  Thought Content:  Focused on discharge to his [facility] ACT Team. We know this patient well and he presents well today at baseline. He is stable for outpatient management and his thoughts are linear, logical, WNL  Suicidal Thoughts:  No  Homicidal Thoughts:  No  Memory:  Immediate;   Fair Recent;   Fair Remote;   Fair  Judgement:  Fair  Insight:  Fair  Psychomotor Activity:  Normal  Concentration:  Fair  Recall:  FiservFair  Fund of Knowledge:Fair  Language: Fair  Akathisia:  No  Handed:    AIMS (if indicated):     Assets:  Communication Skills Desire for Improvement Resilience Social Support  Sleep:     Cognition: WNL  ADL's:  Intact   Mental Status Per Nursing Assessment::   On Admission:     Demographic Factors:  Male and Low socioeconomic status  Loss Factors: none noted at this time  Historical Factors: Impulsivity  Risk Reduction Factors:   Positive  therapeutic relationship and Positive coping skills or problem solving skills  Continued Clinical Symptoms:  More than one psychiatric diagnosis Previous Psychiatric Diagnoses and Treatments  Cognitive Features That Contribute To Risk:  Polarized thinking    Suicide Risk:  Minimal: No identifiable suicidal ideation.  Patients presenting with no risk factors but with morbid ruminations; may be classified as minimal risk based on the severity of the depressive symptoms  Follow-up Information    Schedule an appointment as soon as possible for a visit  with Fleet ContrasAvbuere, Edwin, MD.   Specialty:  Internal Medicine Contact information: [address] [place] [address] [phone]          Plan Of Care/Follow-up recommendations:  Activity:  As tolerated Diet:  Heart healthy with low sodium. Other:  Followup with your ACT Team   Beau FannyWithrow, John C, OregonFNP 04/07/2017, 12:13 PM

## 2017-04-07 NOTE — BH Assessment (Addendum)
Assessment Note   Ryan York is an 46 y.o. male who came to the ED with complaints of hearing voices, paranoia and seeing shadows. He has a history of psychosis and states that he currently has an [facility] team with [facility] [phone]). Pt came in stating  he is hearing voices and seeing stuff placed in his brain by the white people who control the artificial intelligence  Pt states he takes his medication  Last took it today  Pt states he is having some testicle pain because they are squeezing on them and he states he does not like "the homosexuals" touching him. Pt was oriented, coherent when talking to therapist and is requesting to go home. He states that he just "needs to double his seroquel so he can sleep." Therapist stated that he needs to speak to his doctor before changing his dosage. Pt states that he lives alone and is on disability. His UDS is negative for drugs or alcohol and he denies regular use. Pt denies SI or HI. Pt does have a history of multiple psychiatric inpatient admissions due to psychosis.   Disposition: Per Dr. Jannifer Franklin pt can be discharged back to ACT team.   Diagnosis: Schizoaffective disorder, bipolar type   Past Medical History:  Past Medical History:  Diagnosis Date  . Asthma   . Bipolar disorder (HCC)   . Hypertension   . Schizophrenia [facility])     Past Surgical History:  Procedure Laterality Date  . KNEE SURGERY      Family History:  Family History  Problem Relation Age of Onset  . Family history unknown: Yes    Social History:  reports that he has been smoking Cigars.  He has a 5.00 pack-year smoking history. He has never used smokeless tobacco. He reports that he does not drink alcohol or use drugs.  Additional Social History:  Alcohol / Drug Use Pain Medications: See PTA Prescriptions: See PTA Over the Counter: See PTA History of alcohol / drug use?: No history of alcohol / drug abuse Longest period of sobriety (when/how  long): unknown   CIWA: CIWA-Ar BP: (!) 144/87 Pulse Rate: 68 COWS:    PATIENT STRENGTHS: (choose at least two) Average or above average intelligence General fund of knowledge  Allergies:  Allergies  Allergen Reactions  . Lithium Itching  . Vistaril [Hydroxyzine] Nausea And Vomiting    Home Medications:  (Not in a hospital admission)  OB/GYN Status:  No LMP for male patient.  General Assessment Data [facility] of Assessment: [facility] TTS Assessment: In system Is this a Tele or Face-to-Face Assessment?: Face-to-Face Is this an Initial Assessment or a Re-assessment for this encounter?: Initial Assessment Marital status: Single Is patient pregnant?: No Pregnancy Status: No Living Arrangements: Alone Can pt return to current living arrangement?: Yes Admission Status: Voluntary Is patient capable of signing voluntary admission?: Yes Referral Source: Self/Family/Friend Insurance type:  (Medicaid )     Crisis Care Plan Living Arrangements: Alone Legal Guardian:  (NA) Name of Psychiatrist:  (None) Name of Therapist: Doctor, general practice  Education Status Is patient currently in school?: Yes Current Grade: Junior  Highest grade of school patient has completed: Doctor, general practice   Risk to self with the past 6 months Suicidal Ideation: No Has patient been a risk to self within the past 6 months prior to admission? : No Suicidal Intent: No Has patient had any suicidal intent within the past 6 months prior to admission? : No Is patient at risk for  suicide?: No Suicidal Plan?: No Has patient had any suicidal plan within the past 6 months prior to admission? : No What has been your use of drugs/alcohol within the last 12 months?: denies use- UDS negative Previous Attempts/Gestures: No How many times?: 0 Intentional Self Injurious Behavior: None Family Suicide History: Unknown Recent stressful life event(s): Conflict (Comment) Persecutory voices/beliefs?: Yes Depression:  Yes Depression Symptoms: Insomnia Substance abuse history and/or treatment for substance abuse?: No Suicide prevention information given to non-admitted patients: Yes  Risk to Others within the past 6 months Homicidal Ideation: No Does patient have any lifetime risk of violence toward others beyond the six months prior to admission? : Yes (comment) Thoughts of Harm to Others: No-Not Currently Present/Within Last 6 Months (history of thoughts to harm others) Current Homicidal Intent: No Current Homicidal Plan: No Access to Homicidal Means: No Identified Victim: none History of harm to others?: No Assessment of Violence: None Noted Violent Behavior Description: none Does patient have access to weapons?:  (unknown) Criminal Charges Pending?: No Does patient have a court date:  (Unknown) Is patient on probation?: No  Psychosis Hallucinations: Auditory, Tactile, Visual Delusions: Unspecified  Mental Status Report Appearance/Hygiene: Unremarkable Eye Contact: Good Motor Activity: Freedom of movement Speech: Logical/coherent Level of Consciousness: Alert Mood: Depressed Affect: Appropriate to circumstance Anxiety Level: Moderate Thought Processes: Coherent Judgement: Impaired Orientation: Person, Place, Time, Situation Obsessive Compulsive Thoughts/Behaviors: None  Cognitive Functioning Concentration: Normal Memory: Recent Intact, Remote Intact IQ: Average Insight: Fair Impulse Control: Fair Appetite: Poor Weight Loss:  (0) Weight Gain: 0 Sleep: Decreased Total Hours of Sleep: 3 Vegetative Symptoms: None  ADLScreening [facility]HH Assessment Services) Patient's cognitive ability adequate to safely complete daily activities?: Yes Patient able to express need for assistance with ADLs?: Yes Independently performs ADLs?: Yes (appropriate for developmental age)  Prior Inpatient Therapy Prior Inpatient Therapy: Yes Prior Therapy Dates: multiple Prior Therapy  Facilty/Provider(s):  (Unknown) Reason for Treatment: Psychosis   Prior Outpatient Therapy Prior Outpatient Therapy: Yes Prior Therapy Dates: Ongoing Prior Therapy Facilty/Provider(s): [facility] ACT team Reason for Treatment: on disibility  Does patient have an ACCT team?: Yes Does patient have Intensive In-House Services?  : No Does patient have [facility] services? : No Does patient have P4CC services?: No  ADL Screening (condition at time of admission) Patient's cognitive ability adequate to safely complete daily activities?: Yes Is the patient deaf or have difficulty hearing?: No Does the patient have difficulty seeing, even when wearing glasses/contacts?: No Does the patient have difficulty concentrating, remembering, or making decisions?: No Patient able to express need for assistance with ADLs?: Yes Does the patient have difficulty dressing or bathing?: No Independently performs ADLs?: Yes (appropriate for developmental age) Does the patient have difficulty walking or climbing stairs?: No Weakness of Legs: None Weakness of Arms/Hands: None  Home Assistive Devices/Equipment Home Assistive Devices/Equipment: None  Therapy Consults (therapy consults require a physician order) PT Evaluation Needed: No OT Evalulation Needed: No SLP Evaluation Needed: No Abuse/Neglect Assessment (Assessment to be complete while patient is alone) Physical Abuse: Denies Verbal Abuse: Denies Sexual Abuse: Denies Exploitation of patient/patient's resources: Denies Self-Neglect: Denies Values / Beliefs Cultural Requests During Hospitalization: None Spiritual Requests During Hospitalization: None Consults Spiritual Care Consult Needed: No Social Work Consult Needed: No Merchant navy officerAdvance Directives (For Healthcare) Does Patient Have a Medical Advance Directive?: No Would patient like information on creating a medical advance directive?: No - Patient declined Nutrition Screen- [facility]  Adult/[facility]/AP Patient's home diet: Regular Has the patient recently lost  weight without trying?: No Has the patient been eating poorly because of a decreased appetite?: No Malnutrition Screening Tool Score: 0  Additional Information 1:1 In Past 12 Months?: No CIRT Risk: No Elopement Risk: No Does patient have medical clearance?: Yes     Disposition:  Disposition Initial Assessment Completed for this Encounter: Yes Disposition of Patient: Outpatient treatment Type of outpatient treatment: Adult (follow up with ACT team)  Lanice Shirts [facility] 04/07/2017 11:15 AM

## 2017-04-07 NOTE — ED Notes (Signed)
Pt has two personal belongings bags and a black bookbag behind nurses station.

## 2017-04-07 NOTE — Discharge Instructions (Signed)
For your ongoing behavioral health needs, you are advised to continue treatment with [facility]:       [facility]      [address]      [place], [place] [address]      [phone]

## 2017-04-07 NOTE — BH Assessment (Signed)
BHH Assessment Progress Note  Per Thedore MinsMojeed Akintayo, MD, this pt does not require psychiatric hospitalization at this time.  [facility].  This has been included in pt's discharge instructions.  Pt's nurse has been notified.  Doylene Canninghomas Ivet Guerrieri, MA Triage Specialist [phone]

## 2017-04-07 NOTE — ED Provider Notes (Signed)
Pt denies SI/HI.  Calm and appropriate on exam.  Plan to d/c home with outpatient resources.     Tilden Fossaees, Marcine Gadway, MD 04/07/17 [id number]

## 2017-04-07 NOTE — ED Provider Notes (Signed)
[facility]-EMERGENCY DEPT Provider Note   CSN: [id number] Arrival date & time: 04/06/17  2146     History   Chief Complaint Chief Complaint  Patient presents with  . Medical Clearance    HPI Ryan York is a 46 y.o. male.  Patient presents with complaint that his psychiatric medication is not controlling his schizophrenic symptoms. He endorses auditory hallucinations, and believes things have been placed in his body "by the artificial intelligence". He is aware his medications are not working and is here voluntarily for help.    The history is provided by the patient. No language interpreter was used.    Past Medical History:  Diagnosis Date  . Asthma   . Bipolar disorder (HCC)   . Hypertension   . Schizophrenia [facility])     Patient Active Problem List   Diagnosis Date Noted  . Asthma 08/05/2016  . Tobacco use disorder 04/04/2016  . Schizoaffective disorder, bipolar type (HCC) 04/20/2012    Past Surgical History:  Procedure Laterality Date  . KNEE SURGERY         Home Medications    Prior to Admission medications   Medication Sig Start Date End Date Taking? Authorizing Provider  montelukast (SINGULAIR) 10 MG tablet Take 10 mg by mouth at bedtime.    Yes [provider]  QUEtiapine (SEROQUEL XR) 400 MG 24 hr tablet Take 1 tablet (400 mg total) by mouth at bedtime. 08/06/16  Yes Pucilowska, Ellin Goodie, MD    Family History Family History  Problem Relation Age of Onset  . Family history unknown: Yes    Social History Social History  Substance Use Topics  . Smoking status: Current Some Day Smoker    Packs/day: 0.50    Years: 10.00    Types: Cigars  . Smokeless tobacco: Never Used  . Alcohol use No     Comment: denies alcohol use     Allergies   Lithium and Vistaril [hydroxyzine]   Review of Systems Review of Systems  Constitutional: Negative for chills and fever.  HENT: Negative.   Respiratory: Negative.   Cardiovascular: Negative.     Gastrointestinal: Negative.   Musculoskeletal: Negative.   Skin: Negative.   Neurological: Negative.   Psychiatric/Behavioral: Positive for agitation and hallucinations.     Physical Exam Updated Vital Signs BP (!) 144/89 (BP Location: Left Arm)   Pulse 69   Temp 98.6 F (37 C) (Oral)   Resp 20   Ht 6\' 1"  (1.854 m)   Wt 99.8 kg (220 lb)   SpO2 95%   BMI 29.03 kg/m   Physical Exam  Constitutional: He is oriented to person, place, and time. He appears well-developed and well-nourished.  HENT:  Head: Normocephalic.  Neck: Normal range of motion. Neck supple.  Cardiovascular: Normal rate and regular rhythm.   Pulmonary/Chest: Effort normal and breath sounds normal. He has no wheezes. He has no rales.  Abdominal: Soft. Bowel sounds are normal. There is no tenderness. There is no rebound and no guarding.  Musculoskeletal: Normal range of motion.  Neurological: He is alert and oriented to person, place, and time.  Skin: Skin is warm and dry. No rash noted.  Psychiatric: His affect is angry. He is agitated and actively hallucinating. He expresses no homicidal and no suicidal ideation.     ED Treatments / Results  Labs (all labs ordered are listed, but only abnormal results are displayed) Labs Reviewed  CBC - Abnormal; Notable for the following:  Result Value   RBC 4.21 (*)    MCHC 36.3 (*)    All other components within normal limits  COMPREHENSIVE METABOLIC PANEL  ETHANOL  RAPID URINE DRUG SCREEN, HOSP PERFORMED    EKG  EKG Interpretation None       Radiology No results found.  Procedures Procedures (including critical care time)  Medications Ordered in ED Medications - No data to display   Initial Impression / Assessment and Plan / ED Course  I have reviewed the triage vital signs and the nursing notes.  Pertinent labs & imaging results that were available during my care of the patient were reviewed by me and considered in my medical decision  making (see chart for details).     Patient is here for help with treatment of uncontrolled schizophrenia. NO SI/HI. He reports AVH.  Final Clinical Impressions(s) / ED Diagnoses   Final diagnoses:  None   1. Hallucinations 2. History of schizophrenia  New Prescriptions New Prescriptions   No medications on file     Elpidio AnisUpstill, Hermann Dottavio, Cordelia Poche-C 04/07/17 0430    Molpus, Jonny RuizJohn, MD 04/07/17 [phone]

## 2017-04-07 NOTE — ED Notes (Signed)
Patient has one (1) black book bag and two (2) patient belonging bags at nurses's desk on Blue side.

## 2017-11-01 ENCOUNTER — Other Ambulatory Visit: Payer: Self-pay

## 2017-11-01 ENCOUNTER — Encounter (HOSPITAL_COMMUNITY): Payer: Self-pay

## 2017-11-01 DIAGNOSIS — M62830 Muscle spasm of back: Secondary | ICD-10-CM | POA: Insufficient documentation

## 2017-11-01 DIAGNOSIS — J45909 Unspecified asthma, uncomplicated: Secondary | ICD-10-CM | POA: Insufficient documentation

## 2017-11-01 DIAGNOSIS — F17290 Nicotine dependence, other tobacco product, uncomplicated: Secondary | ICD-10-CM | POA: Diagnosis not present

## 2017-11-01 DIAGNOSIS — I10 Essential (primary) hypertension: Secondary | ICD-10-CM | POA: Diagnosis not present

## 2017-11-01 DIAGNOSIS — Z79899 Other long term (current) drug therapy: Secondary | ICD-10-CM | POA: Diagnosis not present

## 2017-11-01 DIAGNOSIS — M545 Low back pain: Secondary | ICD-10-CM | POA: Diagnosis present

## 2017-11-01 NOTE — ED Triage Notes (Signed)
States for 2 days said he slept on the couch wrong now pain in neck and down upper back.

## 2017-11-02 ENCOUNTER — Emergency Department (HOSPITAL_COMMUNITY)
Admission: EM | Admit: 2017-11-02 | Discharge: 2017-11-02 | Disposition: A | Discharge status: Home / Self Care | Payer: Medicaid Other | Attending: Emergency Medicine | Admitting: Emergency Medicine

## 2017-11-02 DIAGNOSIS — M62830 Muscle spasm of back: Secondary | ICD-10-CM

## 2017-11-02 MED ORDER — METHOCARBAMOL 500 MG PO TABS
750.0000 mg | ORAL_TABLET | Freq: Once | ORAL | Status: AC
  Administered 2017-11-02: 750 mg via ORAL
  Filled 2017-11-02: qty 2

## 2017-11-02 MED ORDER — METHOCARBAMOL 500 MG PO TABS: 500.0000 mg | ORAL_TABLET | Freq: Two times a day (BID) | ORAL | 0 refills | Status: DC

## 2017-11-02 MED ORDER — NAPROXEN 500 MG PO TABS: 500.0000 mg | ORAL_TABLET | Freq: Two times a day (BID) | ORAL | 0 refills | Status: DC

## 2017-11-02 MED ORDER — KETOROLAC TROMETHAMINE 30 MG/ML IJ SOLN
30.0000 mg | Freq: Once | INTRAMUSCULAR | Status: AC
  Administered 2017-11-02: 30 mg via INTRAMUSCULAR
  Filled 2017-11-02: qty 1

## 2017-11-02 NOTE — ED Notes (Signed)
[facility] Expected date:  Expected time:  Means of arrival:  Comments: Triage Plocher

## 2017-11-02 NOTE — ED Provider Notes (Signed)
[facility]-EMERGENCY DEPT Provider Note   CSN: [id number] Arrival date & time: 11/01/17  2345     History   Chief Complaint Chief Complaint  Patient presents with  . Back Pain    HPI Idolina Primerremayne G Klingel is a 47 y.o. male.  HPI Idolina Primerremayne G Schachter is a 47 y.o. male with history of hypertension, schizophrenia, asthma, presents to emergency department complaining of back pain.  Patient states he slept on a couch 2 days ago and since then has had upper back pain that is worse when he is laying down or changes position during the night.  Pain does not radiate down his lower back or into his lower extremities.  He has not tried any medications for his pain.  He denies any chest pain.  He denies any shortness of breath.  He denies any injuries.  He denies history of similar pain in the past.  Denies fever or chills.  No IV drug use.  He has not tried any heat or ice.  He denies difficulty ambulating.  No other complaints.  Past Medical History:  Diagnosis Date  . Asthma   . Bipolar disorder (HCC)   . Hypertension   . Schizophrenia [facility](HCC)     Patient Active Problem List   Diagnosis Date Noted  . Asthma 08/05/2016  . Tobacco use disorder 04/04/2016  . Schizoaffective disorder, bipolar type (HCC) 04/20/2012    Past Surgical History:  Procedure Laterality Date  . KNEE SURGERY         Home Medications    Prior to Admission medications   Medication Sig Start Date End Date Taking? Authorizing Provider  montelukast (SINGULAIR) 10 MG tablet Take 10 mg by mouth at bedtime.     [provider]  QUEtiapine (SEROQUEL XR) 400 MG 24 hr tablet Take 1 tablet (400 mg total) by mouth at bedtime. 08/06/16   Pucilowska, Ellin GoodieJolanta B, MD    Family History Family History  Family history unknown: Yes    Social History Social History   Tobacco Use  . Smoking status: Current Some Day Smoker    Packs/day: 0.50    Years: 10.00    Pack years: 5.00    Types: Cigars    . Smokeless tobacco: Never Used  Substance Use Topics  . Alcohol use: No    Comment: denies alcohol use  . Drug use: No    Comment: UDS not available at time of writing     Allergies   Lithium; Risperidone and related; and Vistaril [hydroxyzine]   Review of Systems Review of Systems  Constitutional: Negative for chills and fever.  Respiratory: Negative for cough, chest tightness and shortness of breath.   Cardiovascular: Negative for chest pain, palpitations and leg swelling.  Gastrointestinal: Negative for abdominal distention, abdominal pain, diarrhea, nausea and vomiting.  Genitourinary: Negative for dysuria, frequency, hematuria and urgency.  Musculoskeletal: Positive for arthralgias and back pain. Negative for myalgias, neck pain and neck stiffness.  Skin: Negative for rash.  Allergic/Immunologic: Negative for immunocompromised state.  Neurological: Negative for dizziness, weakness, light-headedness, numbness and headaches.  All other systems reviewed and are negative.    Physical Exam Updated Vital Signs BP 119/90   Pulse 75   Temp 98.1 F (36.7 C) (Oral)   Resp 15   Ht 6\' 2"  (1.88 m)   Wt 99.8 kg (220 lb)   SpO2 99%   BMI 28.25 kg/m   Physical Exam  Constitutional: He appears well-developed and well-nourished. No  distress.  HENT:  Head: Normocephalic and atraumatic.  Eyes: Conjunctivae are normal.  Neck: Neck supple.  Cardiovascular: Normal rate, regular rhythm and normal heart sounds.  Pulmonary/Chest: Effort normal. No respiratory distress. He has no wheezes. He has no rales.  Abdominal: Soft. Bowel sounds are normal. He exhibits no distension. There is no tenderness. There is no rebound.  No CVA tenderness  Musculoskeletal: He exhibits no edema.  Tenderness to palpation over paraspinal muscles of the thoracic spine and parascapular muscles bilaterally.  Neurological: He is alert.  5/5 and equal lower extremity strength. 2+ and equal patellar reflexes  bilaterally. Pt able to dorsiflex bilateral toes and feet with good strength against resistance. Equal sensation bilaterally over thighs and lower legs.   Skin: Skin is warm and dry.  Nursing note and vitals reviewed.    ED Treatments / Results  Labs (all labs ordered are listed, but only abnormal results are displayed) Labs Reviewed - No data to display  EKG  EKG Interpretation None       Radiology No results found.  Procedures Procedures (including critical care time)  Medications Ordered in ED Medications  ketorolac (TORADOL) 30 MG/ML injection 30 mg (not administered)  methocarbamol (ROBAXIN) tablet 750 mg (not administered)     Initial Impression / Assessment and Plan / ED Course  I have reviewed the triage vital signs and the nursing notes.  Pertinent labs & imaging results that were available during my care of the patient were reviewed by me and considered in my medical decision making (see chart for details).     Pt in ED with thoracic back pain, with no injuries.  Pain did start after sleeping on the couch.  Pain is worse at nighttime when he changes positions.  He has no pain with ambulation or movement of extremities.  He is neurovascularly intact with normal strength, sensation, reflexes in his extremities.  Did consider infectious process, however patient is afebrile, denies IV drug use.  He has normal vital signs.  Will try NSAIDs and muscle relaxants.  Explained that if pain does not improve he will need to follow-up with a primary care doctor for further evaluation.  Patient voiced understanding.  Will administer Toradol and Robaxin in emergency department.  Patient otherwise in no acute distress, he is stable for discharge home.  Vitals:   11/01/17 2350 11/01/17 2356 11/02/17 0450  BP:  (!) 150/97 119/90  Pulse:  88 75  Resp:  18 15  Temp:  98.1 F (36.7 C)   TempSrc:  Oral   SpO2:  99% 99%  Weight: 99.8 kg (220 lb)    Height: 6\' 2"  (1.88 m)        Final Clinical Impressions(s) / ED Diagnoses   Final diagnoses:  Muscle spasm of back    ED Discharge Orders    None       Jaynie Crumble, PA-C 11/02/17 9604    Geoffery Lyons, MD 11/02/17 [phone]

## 2017-11-02 NOTE — Discharge Instructions (Signed)
Take naprosyn for pain and inflammation. Robaxin for spasms. Follow up with primary care doctor.

## 2018-01-28 IMAGING — DX DG KNEE COMPLETE 4+V*L*
4 series · 4 of 4 positions shown · non-contrast
Comparison: None.

CLINICAL DATA: Back pain and knee pain

EXAM:
LEFT KNEE - COMPLETE 4+ VIEW

[knee ap]
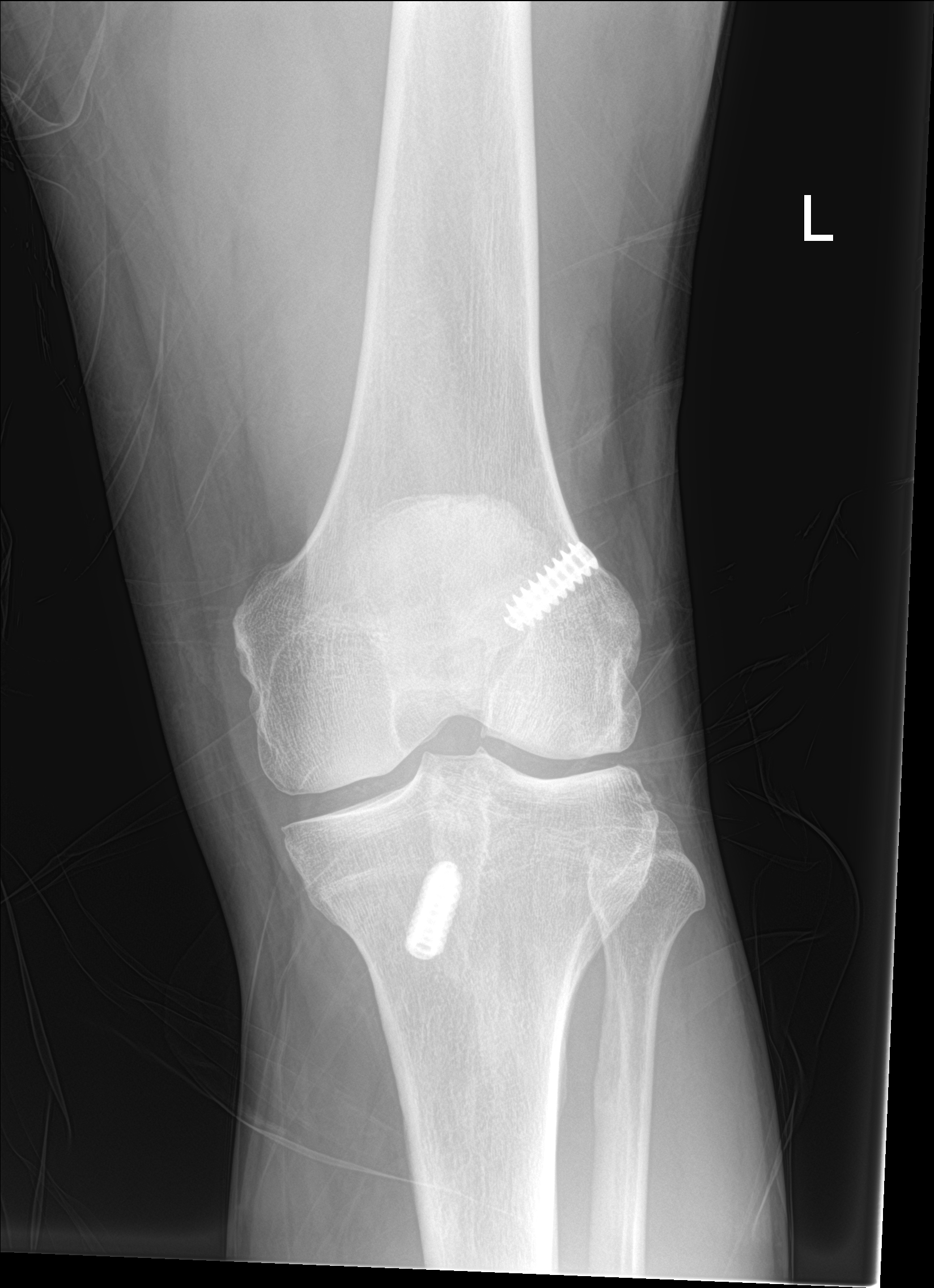

[knee lat]
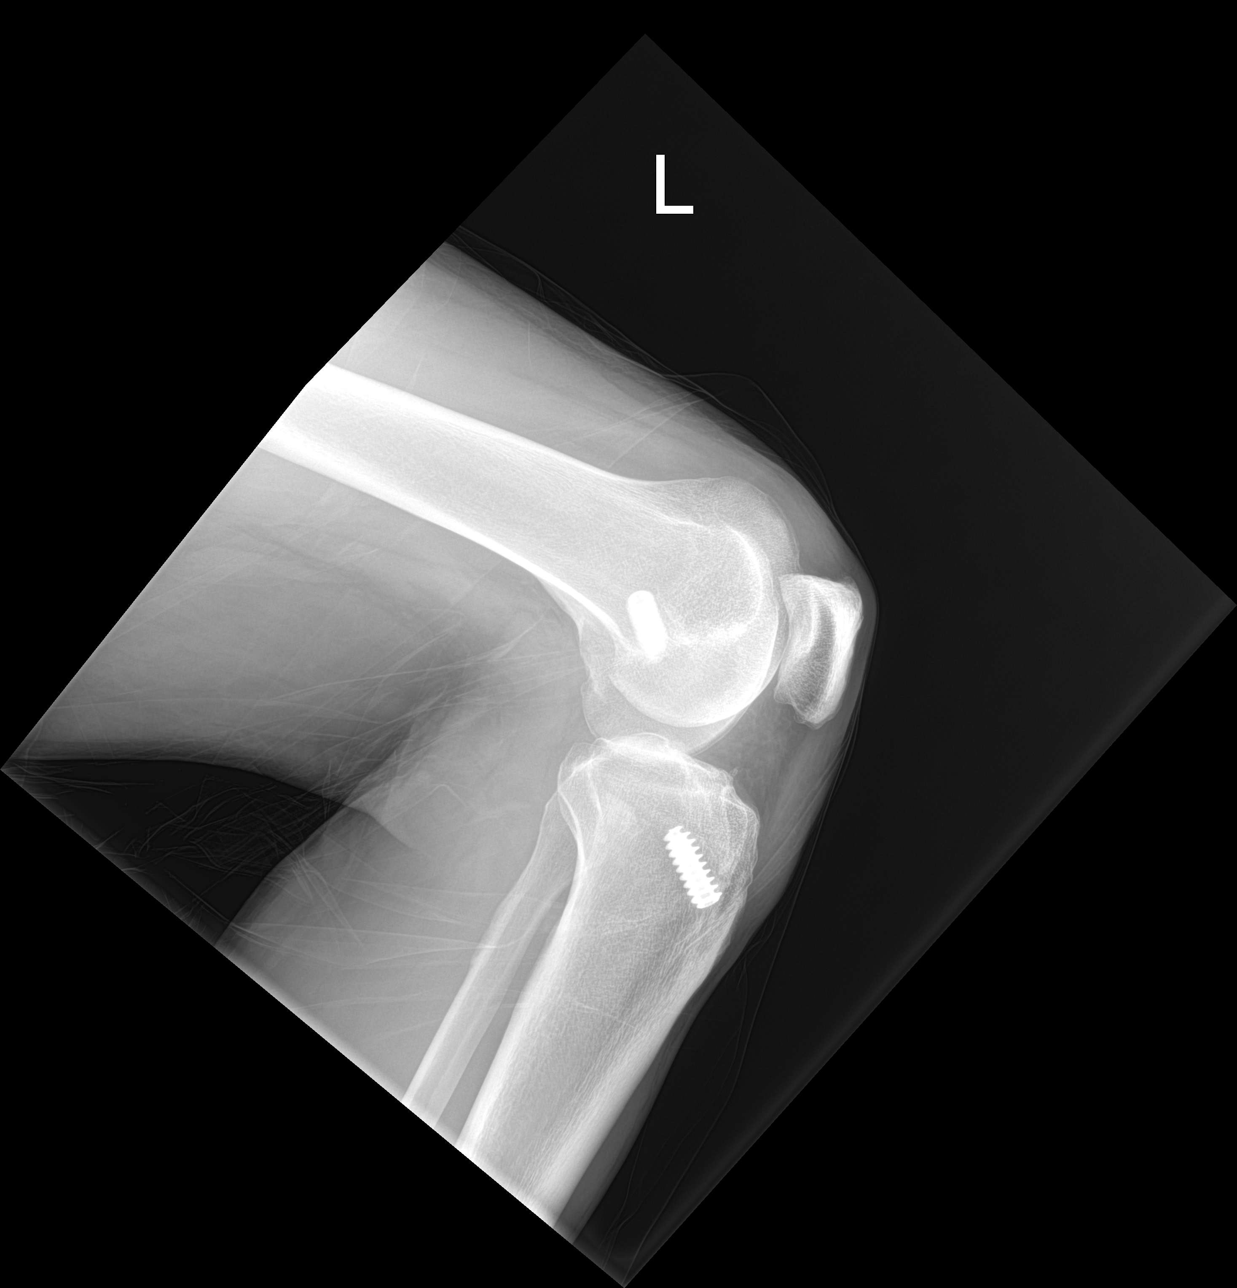

[knee obl (1 of 2)]
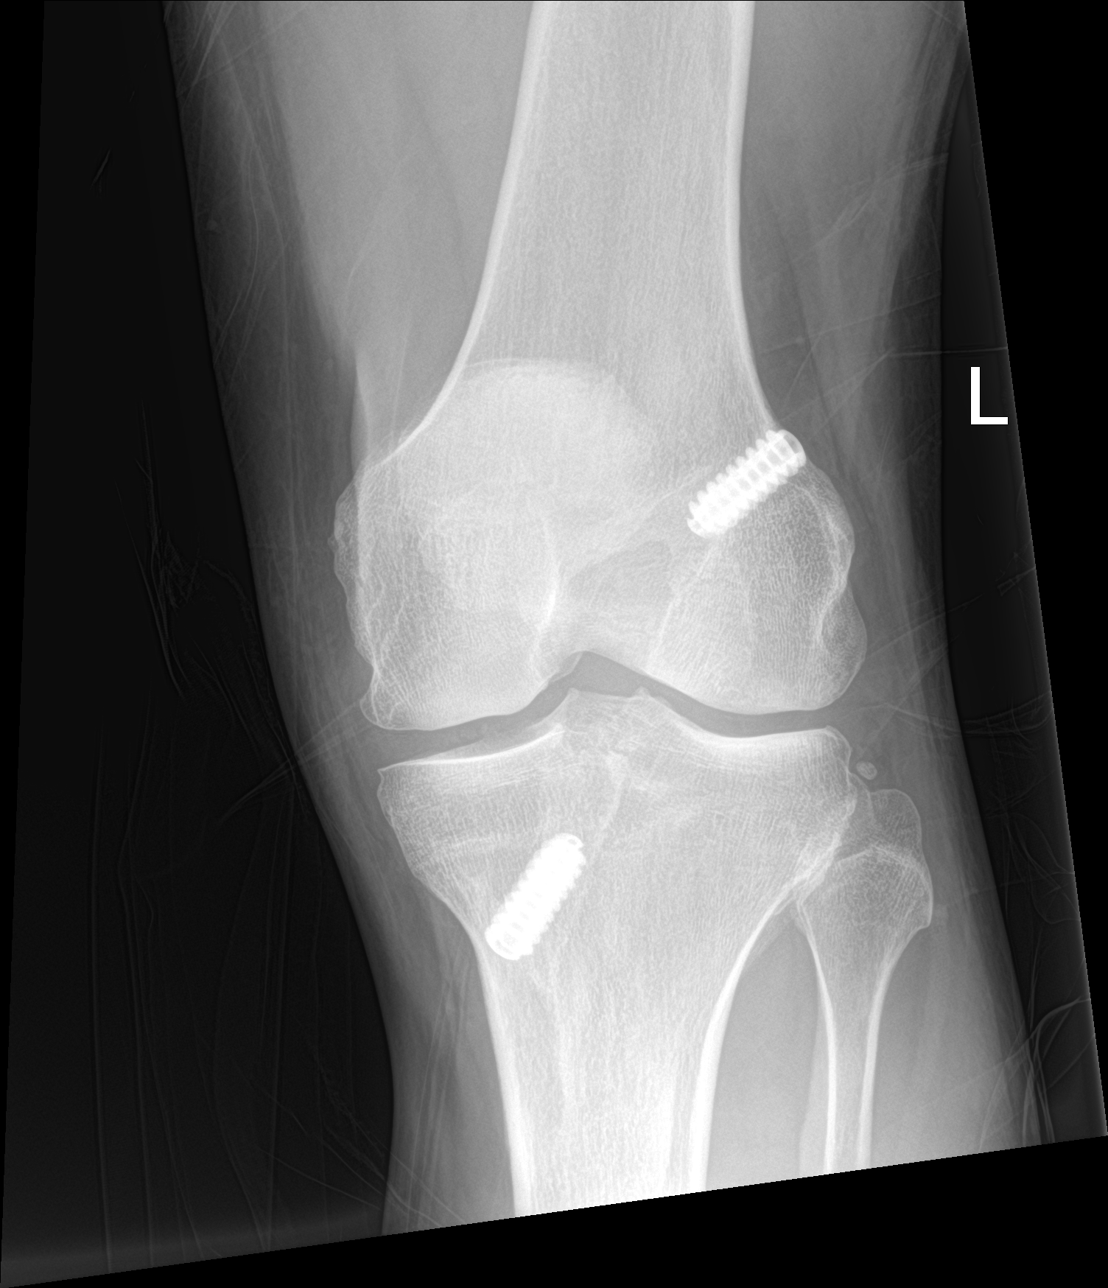

[knee obl (2 of 2)]
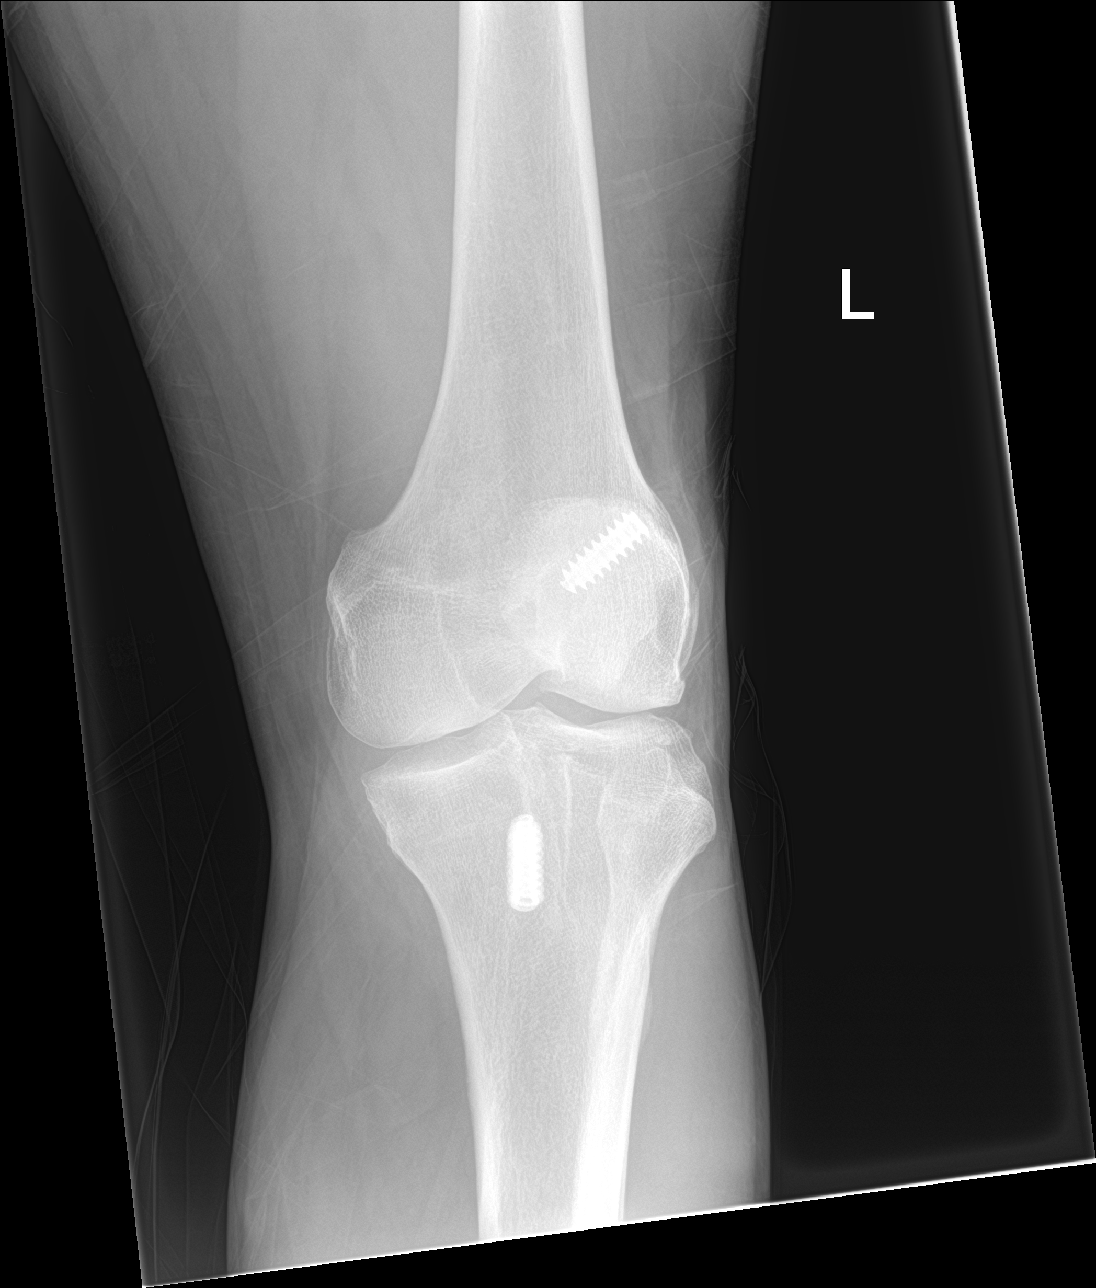

[4 of 4 positions shown; findings below may reference images not displayed]

FINDINGS: Patient is status post ACL repair. No acute fracture or dislocation.
Mild patellofemoral degenerative changes. Mild medial and lateral
joint space narrowing. Faint calcifications at the disc space
suggests chondrocalcinosis. No large effusion.
IMPRESSION: 1. Patient is status post ACL repair.  No acute osseous abnormality.
2. Mild degenerative changes. Faint calcifications within the medial
and lateral joint space compatible with chondrocalcinosis.

## 2018-05-10 ENCOUNTER — Other Ambulatory Visit: Payer: Self-pay

## 2018-05-10 ENCOUNTER — Encounter (HOSPITAL_COMMUNITY): Payer: Self-pay | Admitting: *Deleted

## 2018-05-10 ENCOUNTER — Emergency Department (HOSPITAL_COMMUNITY)
Admission: EM | Admit: 2018-05-10 | Discharge: 2018-05-11 | Disposition: A | Discharge status: Home / Self Care | Payer: Medicaid Other | Attending: Emergency Medicine | Admitting: Emergency Medicine

## 2018-05-10 DIAGNOSIS — F419 Anxiety disorder, unspecified: Secondary | ICD-10-CM | POA: Diagnosis not present

## 2018-05-10 DIAGNOSIS — I10 Essential (primary) hypertension: Secondary | ICD-10-CM | POA: Insufficient documentation

## 2018-05-10 DIAGNOSIS — F17210 Nicotine dependence, cigarettes, uncomplicated: Secondary | ICD-10-CM | POA: Insufficient documentation

## 2018-05-10 DIAGNOSIS — Z79899 Other long term (current) drug therapy: Secondary | ICD-10-CM | POA: Insufficient documentation

## 2018-05-10 DIAGNOSIS — F250 Schizoaffective disorder, bipolar type: Secondary | ICD-10-CM | POA: Diagnosis not present

## 2018-05-10 DIAGNOSIS — R440 Auditory hallucinations: Secondary | ICD-10-CM | POA: Insufficient documentation

## 2018-05-10 DIAGNOSIS — Z720 Tobacco use: Secondary | ICD-10-CM

## 2018-05-10 DIAGNOSIS — R441 Visual hallucinations: Secondary | ICD-10-CM | POA: Insufficient documentation

## 2018-05-10 DIAGNOSIS — R443 Hallucinations, unspecified: Secondary | ICD-10-CM

## 2018-05-10 DIAGNOSIS — Z008 Encounter for other general examination: Secondary | ICD-10-CM

## 2018-05-10 LAB — RAPID URINE DRUG SCREEN, HOSP PERFORMED
Amphetamines: NOT DETECTED
Barbiturates: NOT DETECTED
Benzodiazepines: NOT DETECTED
Cocaine: NOT DETECTED
Opiates: NOT DETECTED
Tetrahydrocannabinol: NOT DETECTED

## 2018-05-10 LAB — COMPREHENSIVE METABOLIC PANEL
ALT: 16 U/L (ref 0–44)
AST: 18 U/L (ref 15–41)
Albumin: 3.8 g/dL (ref 3.5–5.0)
Alkaline Phosphatase: 64 U/L (ref 38–126)
Anion gap: 8 (ref 5–15)
BUN: 19 mg/dL (ref 6–20)
CHLORIDE: 104 mmol/L (ref 98–111)
CO2: 24 mmol/L (ref 22–32)
Calcium: 8.6 mg/dL — ABNORMAL LOW (ref 8.9–10.3)
Creatinine, Ser: 1.15 mg/dL (ref 0.61–1.24)
GFR calc Af Amer: >60 mL/min (ref >60)
GLUCOSE: 90 mg/dL (ref 70–99)
POTASSIUM: 4.1 mmol/L (ref 3.5–5.1)
SODIUM: 136 mmol/L (ref 135–145)
Total Bilirubin: 0.4 mg/dL (ref 0.3–1.2)
Total Protein: 6.9 g/dL (ref 6.5–8.1)

## 2018-05-10 LAB — CBC
HCT: 44.9 % (ref 39.0–52.0)
Hemoglobin: 16.2 g/dL (ref 13.0–17.0)
MCH: 35.3 pg — ABNORMAL HIGH (ref 26.0–34.0)
MCHC: 36.1 g/dL — ABNORMAL HIGH (ref 30.0–36.0)
MCV: 97.8 fL (ref 78.0–100.0)
PLATELETS: 209 10*3/uL (ref 150–400)
RBC: 4.59 MIL/uL (ref 4.22–5.81)
RDW: 13.4 % (ref 11.5–15.5)
WBC: 6.7 10*3/uL (ref 4.0–10.5)

## 2018-05-10 LAB — ETHANOL

## 2018-05-10 LAB — ACETAMINOPHEN LEVEL: Acetaminophen (Tylenol), Serum: <10 ug/mL — ABNORMAL LOW (ref 10–30)

## 2018-05-10 LAB — SALICYLATE LEVEL: Salicylate Lvl: <7 mg/dL (ref 2.8–30.0)

## 2018-05-10 MED ORDER — HALOPERIDOL 5 MG PO TABS
15.0000 mg | ORAL_TABLET | Freq: Every day | ORAL | Status: DC
  Administered 2018-05-10: 15 mg via ORAL
  Filled 2018-05-10: qty 3

## 2018-05-10 MED ORDER — METFORMIN HCL ER 500 MG PO TB24
500.0000 mg | ORAL_TABLET | Freq: Two times a day (BID) | ORAL | Status: DC
  Filled 2018-05-10: qty 1

## 2018-05-10 MED ORDER — ZOLPIDEM TARTRATE 5 MG PO TABS
5.0000 mg | ORAL_TABLET | Freq: Every evening | ORAL | Status: DC | PRN
  Filled 2018-05-10: qty 1

## 2018-05-10 MED ORDER — ONDANSETRON HCL 4 MG PO TABS: 4.0000 mg | ORAL_TABLET | Freq: Three times a day (TID) | ORAL | Status: DC | PRN

## 2018-05-10 MED ORDER — QUETIAPINE FUMARATE 100 MG PO TABS
400.0000 mg | ORAL_TABLET | Freq: Every day | ORAL | Status: DC
  Administered 2018-05-10: 23:00:00 400 mg via ORAL
  Filled 2018-05-10: qty 1

## 2018-05-10 MED ORDER — AMANTADINE HCL 100 MG PO CAPS
100.0000 mg | ORAL_CAPSULE | Freq: Two times a day (BID) | ORAL | Status: DC
  Administered 2018-05-10 – 2018-05-11 (×2): 100 mg via ORAL
  Filled 2018-05-10 (×2): qty 1

## 2018-05-10 MED ORDER — MONTELUKAST SODIUM 10 MG PO TABS
10.0000 mg | ORAL_TABLET | Freq: Every day | ORAL | Status: DC
  Administered 2018-05-10: 10 mg via ORAL
  Filled 2018-05-10: qty 1

## 2018-05-10 MED ORDER — DIVALPROEX SODIUM ER 500 MG PO TB24
500.0000 mg | ORAL_TABLET | Freq: Every morning | ORAL | Status: DC
  Administered 2018-05-11: 500 mg via ORAL
  Filled 2018-05-10: qty 1

## 2018-05-10 MED ORDER — NICOTINE 21 MG/24HR TD PT24
21.0000 mg | MEDICATED_PATCH | Freq: Every day | TRANSDERMAL | Status: DC
  Administered 2018-05-10: 21 mg via TRANSDERMAL
  Filled 2018-05-10: qty 1

## 2018-05-10 MED ORDER — IBUPROFEN 200 MG PO TABS
600.0000 mg | ORAL_TABLET | Freq: Three times a day (TID) | ORAL | Status: DC | PRN
  Administered 2018-05-10: 600 mg via ORAL
  Filled 2018-05-10: qty 3

## 2018-05-10 MED ORDER — LORATADINE 10 MG PO TABS
10.0000 mg | ORAL_TABLET | Freq: Every day | ORAL | Status: DC
  Administered 2018-05-11: 10 mg via ORAL
  Filled 2018-05-10: qty 1

## 2018-05-10 MED ORDER — ALUM & MAG HYDROXIDE-SIMETH 200-200-20 MG/5ML PO SUSP: 30.0000 mL | Freq: Four times a day (QID) | ORAL | Status: DC | PRN

## 2018-05-10 NOTE — ED Notes (Signed)
Pt has been changed into scrubs, belongings removed (1 back pack and 1 belonging bag), and wanded by security.

## 2018-05-10 NOTE — ED Triage Notes (Signed)
Patient states that he is hearing "30-40 voices that want to hurt me all the time. I have to get out of the city limits before they hurt me." Pt says that he is taking his medications as prescribed, reports that he does not want to hurt himself "I can't even do it even if I wanted to, they will get to me first". He is calm and cooperative.

## 2018-05-10 NOTE — ED Notes (Signed)
Bed: [id number] Expected date:  Expected time:  Means of arrival:  Comments: Gammage

## 2018-05-10 NOTE — BH Assessment (Addendum)
Assessment Note  Ryan York is an 47 y.o. male.  -Clinician reviewed note by Rhona Raider, PA.  presents to the ED with complaints of auditory and visual hallucinations that have been ongoing for a while however have been worse today.  He states that he wants to go back to where he was previously for his psychiatric care, wants inpatient assistance.  He states that he is hearing voices that are "evil", reports that they are trying to "get him to masturbate", and he states that "they are trying to kill me".  He denies that they tell him to kill himself or hurt himself, but he reports that he is afraid of them because he feels like there trying to get him.  He also reports visual hallucinations seeing people touching him.  He states that this is making very anxious.  Patient tells this clinician that he is feeling much better.  He says that he does not feel suicidal now.  Patient says he feels like the voices are not as loud as they usually are whenever he is in a hospital or facility.  He does say he still can hear then talking to him about making him become homosexual.  He says he sees these people talking to him and they are always behind him.  Patient says that the voices cannot get to him or follow him in the hospital.  He said he had an asthma attack and that made him panic and think that they were after him more.  Pt says he thinks that it was because of the coffee he was drinking that may have triggered a asthma attack.  Patient denies any HI.  He is restless during assessment.  Paces about and even commented that he felt calmer than when he had been home.    Patient says he has ACTT team services through [facility].  He reports taking his medications as directed.  He did ask nurse for only 100mg  of a medication that normally is prescribedfor 400mg .    Patient denies any SA issues.  He was inpatient at [facility] in 07/2016.    -Clinician discussed patient care with [name],  FNP.  He recommended pt be observed then have psychiatry review him in the AM.  May need to contact his ACTT team about transportation if needed.  Diagnosis: F25.0 Schizoaffective d/o bipolar type  Past Medical History:  Past Medical History:  Diagnosis Date  . Asthma   . Bipolar disorder (HCC)   . Hypertension   . Schizophrenia [facility])     Past Surgical History:  Procedure Laterality Date  . KNEE SURGERY      Family History:  Family History  Family history unknown: Yes    Social History:  reports that he has been smoking cigars. He has a 5.00 pack-year smoking history. He has never used smokeless tobacco. He reports that he does not drink alcohol or use drugs.  Additional Social History:  Alcohol / Drug Use Pain Medications: See PTA medication list Prescriptions: See PTA medication list Over the Counter: See PTA medication list History of alcohol / drug use?: No history of alcohol / drug abuse  CIWA: CIWA-Ar BP: (!) 151/99 Pulse Rate: 68 COWS:    Allergies:  Allergies  Allergen Reactions  . Hydroxyzine Hcl     Sexual difficulty   . Lithium Itching  . Risperidone And Related     "It shrinks my penis."   . Vistaril [Hydroxyzine] Nausea And Vomiting  Home Medications:  (Not in a hospital admission)  OB/GYN Status:  No LMP for male patient.  General Assessment Data Location of Assessment: [facility] ED TTS Assessment: In system Is this a Tele or Face-to-Face Assessment?: Face-to-Face Is this an Initial Assessment or a Re-assessment for this encounter?: Initial Assessment Marital status: Single Is patient pregnant?: No Pregnancy Status: No Living Arrangements: Alone Can pt return to current living arrangement?: Yes Admission Status: Voluntary Is patient capable of signing voluntary admission?: Yes Referral Source: Self/Family/Friend Insurance type: MCD     Crisis Care Plan Living Arrangements: Alone Name of Psychiatrist: [facility] ACTT  team Name of Therapist: [facility] ACTT team  Education Status Is patient currently in school?: No Is the patient employed, unemployed or receiving disability?: Receiving disability income  Risk to self with the past 6 months Suicidal Ideation: No Has patient been a risk to self within the past 6 months prior to admission? : No Suicidal Intent: No Has patient had any suicidal intent within the past 6 months prior to admission? : No Is patient at risk for suicide?: No Suicidal Plan?: No Has patient had any suicidal plan within the past 6 months prior to admission? : No Access to Means: No What has been your use of drugs/alcohol within the last 12 months?: None Previous Attempts/Gestures: No How many times?: 0 Other Self Harm Risks: None Triggers for Past Attempts: None known Intentional Self Injurious Behavior: None Family Suicide History: No Recent stressful life event(s): Other (Comment)(Pt cannot identify any stressors.) Persecutory voices/beliefs?: Yes Depression: Yes Depression Symptoms: Guilt Substance abuse history and/or treatment for substance abuse?: No Suicide prevention information given to non-admitted patients: Not applicable  Risk to Others within the past 6 months Homicidal Ideation: No Does patient have any lifetime risk of violence toward others beyond the six months prior to admission? : No Thoughts of Harm to Others: No Current Homicidal Intent: No Current Homicidal Plan: No Access to Homicidal Means: No Identified Victim: No one History of harm to others?: No Assessment of Violence: None Noted Violent Behavior Description: None Does patient have access to weapons?: No Criminal Charges Pending?: No Does patient have a court date: No Is patient on probation?: No  Psychosis Hallucinations: Auditory, Visual(Pt hears voices telling him they would harm him.  Sees peopl) Delusions: Persecutory(Voices chasing after him.)  Mental Status  Report Appearance/Hygiene: Disheveled, In scrubs Eye Contact: Fair Motor Activity: Freedom of movement, Unremarkable Speech: Logical/coherent Level of Consciousness: Alert Mood: Anxious, Sad Affect: Depressed, Anxious Anxiety Level: Moderate Thought Processes: Coherent, Relevant Judgement: Impaired Orientation: Appropriate for developmental age Obsessive Compulsive Thoughts/Behaviors: None  Cognitive Functioning Concentration: Normal Memory: Recent Intact, Remote Intact Is patient IDD: No Is patient DD?: No Insight: Poor Impulse Control: Fair Appetite: Good Have you had any weight changes? : No Change Sleep: No Change Total Hours of Sleep: 8 Vegetative Symptoms: Staying in bed  ADLScreening [facility]) Patient's cognitive ability adequate to safely complete daily activities?: Yes Patient able to express need for assistance with ADLs?: Yes Independently performs ADLs?: Yes (appropriate for developmental age)  Prior Inpatient Therapy Prior Inpatient Therapy: Yes Prior Therapy Dates: 07/2016 [facility] Reason for Treatment: SI  Prior Outpatient Therapy Prior Outpatient Therapy: Yes Prior Therapy Dates: Since 10/2016 Prior Therapy Facilty/Provider(s): [facility] Reason for Treatment: ACTT team Does patient have an ACCT team?: Yes Does patient have Intensive In-House Services?  : No Does patient have [facility] services? : No Does patient have P4CC services?:  No  ADL Screening (condition at time of admission) Patient's cognitive ability adequate to safely complete daily activities?: Yes Is the patient deaf or have difficulty hearing?: No Does the patient have difficulty seeing, even when wearing glasses/contacts?: No Does the patient have difficulty concentrating, remembering, or making decisions?: Yes Patient able to express need for assistance with ADLs?: Yes Does the patient have difficulty dressing or bathing?:  No Independently performs ADLs?: Yes (appropriate for developmental age) Does the patient have difficulty walking or climbing stairs?: No Weakness of Legs: None Weakness of Arms/Hands: None       Abuse/Neglect Assessment (Assessment to be complete while patient is alone) Abuse/Neglect Assessment Can Be Completed: Yes Physical Abuse: Denies Verbal Abuse: Denies Sexual Abuse: Denies Exploitation of patient/patient's resources: Denies Self-Neglect: Denies     Merchant navy officerAdvance Directives (For Healthcare) Does Patient Have a Medical Advance Directive?: No Would patient like information on creating a medical advance directive?: No - Patient declined          Disposition:  Disposition Initial Assessment Completed for this Encounter: Yes Patient referred to: Other (Comment)(To be reviewed by FNP)  On Site Evaluation by:   Reviewed with Physician:    Alexandria LodgeHarvey, Alegandra Sommers Ray 05/10/2018 10:58 PM

## 2018-05-10 NOTE — ED Notes (Signed)
Pt presents with Auditory Hallucinations increasing in severity x 3 weeks.  Pt reports he hears voices all the time, but now they are worse.  Denies SI or HI, denies feeling hopeless.  Pt states he experienced an Anxiety Attack tonight.  A&O x 3, calm & cooperative, monitoring for safety, Q 15 min checks in effect.

## 2018-05-10 NOTE — ED Provider Notes (Signed)
[facility]-EMERGENCY DEPT Provider Note   CSN: [id number] Arrival date & time: 05/10/18  1925     History   Chief Complaint Chief Complaint  Patient presents with  . Hallucinations    HPI Ryan York is a 47 y.o. male with a PMHx of bipolar disorder, schizophrenia, asthma, HTN, chronic L knee and back pain, ?pre-diabetes, and ?HLD, who presents to the ED with complaints of auditory and visual hallucinations that have been ongoing for a while however have been worse today.  He states that he wants to go back to where he was previously for his psychiatric care, wants inpatient assistance.  He states that he is hearing voices that are "evil", reports that they are trying to "get him to masturbate", and he states that "they are trying to kill me".  He denies that they tell him to kill himself or hurt himself, but he reports that he is afraid of them because he feels like there trying to get him.  He also reports visual hallucinations seeing people touching him.  He states that this is making very anxious.  Earlier he had some chest discomfort and shortness of breath however he states that that has now resolved and he feels better now.  He denies SI, HI, illicit drug use, or alcohol use.  He is a cigarette smoker.  He reports compliance with his medications which include Depakote (unsure dosage) and Seroquel (states that he is prescribed 400 mg daily but he only takes 1/4-1/2 of the tablet instead of taking the full dose).  He also states that he takes something for high blood pressure, high cholesterol, prediabetes, and he breathing medication, but he is not sure what any of these are.  He did not bring any of them with him.  He states that he last took his Depakote and Seroquel today.  He reports having chronic left knee and left low back pain, but denies any acute complaints at this time.  He has no medical complaints currently.  He is here voluntarily and asking for help,  and requesting inpatient assistance with his hallucinations.  The history is provided by the patient and medical records. No language interpreter was used.    Past Medical History:  Diagnosis Date  . Asthma   . Bipolar disorder (HCC)   . Hypertension   . Schizophrenia [facility])     Patient Active Problem List   Diagnosis Date Noted  . Asthma 08/05/2016  . Tobacco use disorder 04/04/2016  . Schizoaffective disorder, bipolar type (HCC) 04/20/2012    Past Surgical History:  Procedure Laterality Date  . KNEE SURGERY          Home Medications    Prior to Admission medications   Medication Sig Start Date End Date Taking? Authorizing Provider  methocarbamol (ROBAXIN) 500 MG tablet Take 1 tablet (500 mg total) by mouth 2 (two) times daily. 11/02/17   Kirichenko, Tatyana, PA-C  montelukast (SINGULAIR) 10 MG tablet Take 10 mg by mouth at bedtime.     [provider]  naproxen (NAPROSYN) 500 MG tablet Take 1 tablet (500 mg total) by mouth 2 (two) times daily. 11/02/17   Kirichenko, Lemont Fillers, PA-C  QUEtiapine (SEROQUEL XR) 400 MG 24 hr tablet Take 1 tablet (400 mg total) by mouth at bedtime. 08/06/16   Pucilowska, Ellin Goodie, MD    Family History Family History  Family history unknown: Yes    Social History Social History   Tobacco Use  .  Smoking status: Current Some Day Smoker    Packs/day: 0.50    Years: 10.00    Pack years: 5.00    Types: Cigars  . Smokeless tobacco: Never Used  Substance Use Topics  . Alcohol use: No    Comment: denies alcohol use  . Drug use: No    Comment: UDS not available at time of writing     Allergies   Lithium; Risperidone and related; and Vistaril [hydroxyzine]   Review of Systems Review of Systems  Constitutional: Negative for chills and fever.  Respiratory: Negative for shortness of breath.   Cardiovascular: Negative for chest pain.  Gastrointestinal: Negative for abdominal pain, constipation, diarrhea, nausea and vomiting.    Genitourinary: Negative for dysuria and hematuria.  Musculoskeletal: Positive for arthralgias (chronic and unchanged) and back pain (chronic and unchanged). Negative for myalgias.  Skin: Negative for color change.  Allergic/Immunologic: Positive for immunocompromised state (?pre-diabetes).  Neurological: Negative for weakness and numbness.  Psychiatric/Behavioral: Positive for hallucinations. Negative for suicidal ideas. The patient is nervous/anxious.    All other systems reviewed and are negative for acute change except as noted in the HPI.    Physical Exam Updated Vital Signs BP (!) 135/104 (BP Location: Right Arm)   Pulse 83   Temp 97.7 F (36.5 C) (Oral)   Resp 18   SpO2 99%   Physical Exam  Constitutional: He is oriented to person, place, and time. Vital signs are normal. He appears well-developed and well-nourished.  Non-toxic appearance. No distress.  Afebrile, nontoxic, NAD  HENT:  Head: Normocephalic and atraumatic.  Mouth/Throat: Oropharynx is clear and moist and mucous membranes are normal.  Eyes: Conjunctivae and EOM are normal. Right eye exhibits no discharge. Left eye exhibits no discharge.  Neck: Normal range of motion. Neck supple.  Cardiovascular: Normal rate, regular rhythm, normal heart sounds and intact distal pulses. Exam reveals no gallop and no friction rub.  No murmur heard. Pulmonary/Chest: Effort normal and breath sounds normal. No respiratory distress. He has no decreased breath sounds. He has no wheezes. He has no rhonchi. He has no rales.  Abdominal: Soft. Normal appearance and bowel sounds are normal. He exhibits no distension. There is no tenderness. There is no rigidity, no rebound, no guarding, no CVA tenderness, no tenderness at McBurney's point and negative Murphy's sign.  Musculoskeletal: Normal range of motion.  MAE x4 Strength and sensation grossly intact in all extremities Distal pulses intact Gait steady and nonantalgic No focal midline  spinal TTP, no bony stepoffs or deformities.   Neurological: He is alert and oriented to person, place, and time. He has normal strength. No sensory deficit.  Skin: Skin is warm, dry and intact. No rash noted.  Psychiatric: His mood appears anxious. He is actively hallucinating. Thought content is paranoid. He expresses no homicidal and no suicidal ideation. He expresses no suicidal plans and no homicidal plans.  Anxious affect, but pleasant and cooperative, pacing around the room at times. Somewhat paranoid. Denies SI or HI, endorsing auditory and visual hallucinations, doesn't seem to be responding to internal stimuli although it's hard to tell because he seems very paranoid about these hallucinations and is fairly anxious appearing.  Nursing note and vitals reviewed.    ED Treatments / Results  Labs (all labs ordered are listed, but only abnormal results are displayed) Labs Reviewed  COMPREHENSIVE METABOLIC PANEL - Abnormal; Notable for the following components:      Result Value   Calcium 8.6 (*)  All other components within normal limits  ACETAMINOPHEN LEVEL - Abnormal; Notable for the following components:   Acetaminophen (Tylenol), Serum <10 (*)    All other components within normal limits  CBC - Abnormal; Notable for the following components:   MCH 35.3 (*)    MCHC 36.1 (*)    All other components within normal limits  ETHANOL  SALICYLATE LEVEL  RAPID URINE DRUG SCREEN, HOSP PERFORMED    EKG None  Radiology No results found.  Procedures Procedures (including critical care time)  Medications Ordered in ED Medications  amantadine (SYMMETREL) capsule 100 mg (has no administration in time range)  loratadine (CLARITIN) tablet 10 mg (has no administration in time range)  divalproex (DEPAKOTE ER) 24 hr tablet 500 mg (has no administration in time range)  haloperidol (HALDOL) tablet 15 mg (has no administration in time range)  metFORMIN (GLUCOPHAGE-XR) 24 hr tablet 500  mg (has no administration in time range)  montelukast (SINGULAIR) tablet 10 mg (has no administration in time range)  QUEtiapine (SEROQUEL) tablet 400 mg (has no administration in time range)  zolpidem (AMBIEN) tablet 5 mg (has no administration in time range)  ibuprofen (ADVIL,MOTRIN) tablet 600 mg (has no administration in time range)  ondansetron (ZOFRAN) tablet 4 mg (has no administration in time range)  alum & mag hydroxide-simeth (MAALOX/MYLANTA) 200-200-20 MG/5ML suspension 30 mL (has no administration in time range)  nicotine (NICODERM CQ - dosed in mg/24 hours) patch 21 mg (has no administration in time range)     Initial Impression / Assessment and Plan / ED Course  I have reviewed the triage vital signs and the nursing notes.  Pertinent labs & imaging results that were available during my care of the patient were reviewed by me and considered in my medical decision making (see chart for details).     47 y.o. male here with auditory and visual hallucinations worse today, feels like they're going to kill him; he denies SI/HI. No drug/EtOH use, +tobacco user. Smoking cessation strongly encouraged. He reports compliance with meds, however can't recall what meds he's on (states depakote but unknown dose, seroquel but not taking prescribed dose, and has something for HTN, HLD, pre-diabetes, and for his "breathing" but he can't remember what any of them are). Has no medical complaints at this time aside from chronic L knee and L back pain. On exam, pt ambulatory with steady gait, no midline spinal TTP, VSS, appears anxious, doesn't seem to be responding to internal stimuli however seems paranoid about his AVH. Will get psych clearance labs, med rec, and reassess shortly.   9:00 PM CBC WNL. CMP WNL. EtOH level undetectable. Salicylate and acetaminophen levels WNL. UDS negative. Pt medically cleared at this time. Psych hold orders and home med orders placed (meds reconciled by med tech, she  called his ?ACT team/medical team and they went through each of his meds; no BP or HLD med reported so unclear if he has meds for these or not). Please see TTS notes for further documentation of care/dispo. PLEASE NOTE THAT PT IS HERE VOLUNTARILY AT THIS TIME, IF PT TRIES TO LEAVE THEY WOULD NEED REASSESSMENT TO SEE IF [name] PAPERWORK NEEDS TO BE TAKEN OUT. Pt stable at time of med clearance.     Final Clinical Impressions(s) / ED Diagnoses   Final diagnoses:  Hallucinations  Anxiety  Medical clearance for psychiatric admission  Tobacco user    ED Discharge Orders    None       Jailen Coward,  Roselyn ReefMercedes, PA-C 05/10/18 2102    Little, Ambrose Finlandachel Morgan, MD 05/13/18 1357

## 2018-05-10 NOTE — ED Triage Notes (Signed)
Pt arrives ambulatory from home via EMS to triage. Per their report, the pt has had worsening auditory hallucinations over the past 3 weeks. Hx of schizophrenia, has been compliant with his medications. Tonight, he had increased anxiety that caused some shortness of breath. En route, vitals, 152/98, hr 80, r 16, cbg 89.

## 2018-05-11 NOTE — BHH Suicide Risk Assessment (Cosign Needed)
Suicide Risk Assessment  Discharge Assessment   [facility]   HPI:  Ryan York is an 47 y.o. male.  -Clinician reviewed note by Rhona RaiderMercedes Street, PA.  presents to the ED with complaints of auditory and visual hallucinations that have been ongoing for a while however have been worse today. He states that he wants to go back to where he was previously for his psychiatric care, wants inpatient assistance. He states that he is hearing voices that are "evil", reports that they are trying to "get him to masturbate", and he states that "they are trying to kill me". He denies that they tell him to kill himself or hurt himself, but he reports that he is afraid of them because he feels like there trying to get him. He also reports visual hallucinations seeing people touching him. He states that this is making very anxious.  Patient tells this clinician that he is feeling much better.  He says that he does not feel suicidal now.  Patient says he feels like the voices are not as loud as they usually are whenever he is in a hospital or facility.  He does say he still can hear then talking to him about making him become homosexual.  He says he sees these people talking to him and they are always behind him.  Patient says that the voices cannot get to him or follow him in the hospital.  He said he had an asthma attack and that made him panic and think that they were after him more.  Pt says he thinks that it was because of the coffee he was drinking that may have triggered a asthma attack.  Patient denies any HI.  He is restless during assessment.  Paces about and even commented that he felt calmer than when he had been home.    Patient says he has ACTT team services through [facility].  He reports taking his medications as directed.  He did ask nurse for only 100mg  of a medication that normally is prescribedfor 400mg .    Patient denies any SA issues.  [facility]  [facility].   Principal Problem: Schizoaffective disorder, bipolar type [facility](HCC) Discharge Diagnoses:  Patient Active Problem List   Diagnosis Date Noted  . Asthma [J45.909] 08/05/2016  . Tobacco use disorder [F17.200] 04/04/2016  . Schizoaffective disorder, bipolar type (HCC) [F25.0] 04/20/2012    Total Time spent with patient: 20 minutes  Musculoskeletal: Strength & Muscle Tone: within normal limits Gait & Station: normal Patient leans: N/A  Psychiatric Specialty Exam:   Blood pressure 129/87, pulse 65, temperature 97.8 F (36.6 C), temperature source Oral, resp. rate 14, height 6\' 1"  (1.854 m), weight 99.3 kg, SpO2 100 %.Body mass index is 28.89 kg/m.  General Appearance: Fairly Groomed  Patent attorneyye Contact::  Fair  Speech:  Clear and Coherent and Normal Rate409  Volume:  Normal  Mood:  Euthymic  Affect:  Appropriate and Congruent  Thought Process:  Goal Directed, Linear and Descriptions of Associations: Intact  Orientation:  Full (Time, Place, and Person)  Thought Content:  WDL  Suicidal Thoughts:  No  Homicidal Thoughts:  No  Memory:  Immediate;   Fair Recent;   Fair Remote;   Fair  Judgement:  Intact  Insight:  Present  Psychomotor Activity:  Normal  Concentration:  Fair  Recall:  Good  Fund of Knowledge:Fair  Language: Good  Akathisia:  No  Handed:  Right  AIMS (if  indicated):     Assets:  Communication Skills Desire for Improvement Financial Resources/Insurance Leisure Time Physical Health Social Support Vocational/Educational  Sleep:     Cognition: WNL  ADL's:  Intact   Mental Status Per Nursing Assessment::   On Admission:     Demographic Factors:  Male, Living alone and Unemployed  Loss Factors: NA  Historical Factors: Prior suicide attempts  Risk Reduction Factors:   Positive social support, Positive therapeutic relationship and Positive coping skills or problem solving skills  Continued Clinical Symptoms:  Severe Anxiety  and/or Agitation Schizophrenia:   Depressive state  Cognitive Features That Contribute To Risk:  None    Suicide Risk:  Minimal: No identifiable suicidal ideation.  Patients presenting with no risk factors but with morbid ruminations; may be classified as minimal risk based on the severity of the depressive symptoms    Plan Of Care/Follow-up recommendations:  Activity:  Increase activity as tolerated Diet:  Routine diet as directed Tests:  Routine testing as suggested by outpatietn pscyhiatrist.  Other:  Even if you begin to feel better continue taking your medications.   Truman Haywardakia S Starkes, FNP 05/11/2018, 11:50 AM

## 2018-05-11 NOTE — ED Notes (Signed)
Pt ACT team called and left a number just in case we needed to contact them: [phone]

## 2018-05-11 NOTE — BH Assessment (Signed)
BHH Assessment Progress Note  Per Jacqueline Norman, DO, this pt does not require psychiatric hospitalization at this time.  Pt is to be discharged from [facility] with recommendation to continue treatment with the [facility] ACT Team.  This has been included in pt's discharge instructions.  Pt's nurse, Edie, has been notified.  Marolyn Urschel, MA Triage Specialist [phone]     

## 2018-05-11 NOTE — Discharge Instructions (Signed)
For your behavioral health needs, you are advised to continue treatment with the [facility] ACT Team: ° °     [facility] °     [address] °     [place], [place] [address] °     [phone] °

## 2018-05-11 NOTE — ED Notes (Signed)
Pt is calm and cooperative.  States he is feeling safe today although he is still having audio hallucinations.   He took all of his medication except for Metformin, He states he does not have diabetes and his glucose results are WNL.  15 minute checks and video monitoring in place.

## 2018-05-11 NOTE — ED Notes (Signed)
Pt discharged safely .  Discharge instructions were reviewed.  Pt was in no distress.  All belongings were returned to pt.

## 2018-05-18 ENCOUNTER — Emergency Department (HOSPITAL_COMMUNITY)
Admission: EM | Admit: 2018-05-18 | Discharge: 2018-05-19 | Disposition: A | Discharge status: Home / Self Care | Payer: Medicaid Other | Attending: Emergency Medicine | Admitting: Emergency Medicine

## 2018-05-18 ENCOUNTER — Other Ambulatory Visit: Payer: Self-pay

## 2018-05-18 ENCOUNTER — Encounter (HOSPITAL_COMMUNITY): Payer: Self-pay

## 2018-05-18 DIAGNOSIS — I10 Essential (primary) hypertension: Secondary | ICD-10-CM | POA: Insufficient documentation

## 2018-05-18 DIAGNOSIS — F250 Schizoaffective disorder, bipolar type: Secondary | ICD-10-CM | POA: Diagnosis not present

## 2018-05-18 DIAGNOSIS — F17290 Nicotine dependence, other tobacco product, uncomplicated: Secondary | ICD-10-CM | POA: Diagnosis not present

## 2018-05-18 DIAGNOSIS — F259 Schizoaffective disorder, unspecified: Secondary | ICD-10-CM | POA: Insufficient documentation

## 2018-05-18 DIAGNOSIS — R45851 Suicidal ideations: Secondary | ICD-10-CM | POA: Diagnosis not present

## 2018-05-18 DIAGNOSIS — F209 Schizophrenia, unspecified: Secondary | ICD-10-CM

## 2018-05-18 DIAGNOSIS — Z7984 Long term (current) use of oral hypoglycemic drugs: Secondary | ICD-10-CM | POA: Insufficient documentation

## 2018-05-18 DIAGNOSIS — R440 Auditory hallucinations: Secondary | ICD-10-CM | POA: Diagnosis present

## 2018-05-18 DIAGNOSIS — Z79899 Other long term (current) drug therapy: Secondary | ICD-10-CM | POA: Diagnosis not present

## 2018-05-18 DIAGNOSIS — R45850 Homicidal ideations: Secondary | ICD-10-CM | POA: Diagnosis not present

## 2018-05-18 DIAGNOSIS — J45909 Unspecified asthma, uncomplicated: Secondary | ICD-10-CM | POA: Diagnosis not present

## 2018-05-18 LAB — COMPREHENSIVE METABOLIC PANEL
ALT: 15 U/L (ref 0–44)
AST: 30 U/L (ref 15–41)
Albumin: 3.4 g/dL — ABNORMAL LOW (ref 3.5–5.0)
Alkaline Phosphatase: 62 U/L (ref 38–126)
Anion gap: 8 (ref 5–15)
BILIRUBIN TOTAL: 0.7 mg/dL (ref 0.3–1.2)
BUN: 8 mg/dL (ref 6–20)
CHLORIDE: 106 mmol/L (ref 98–111)
CO2: 25 mmol/L (ref 22–32)
Calcium: 8.4 mg/dL — ABNORMAL LOW (ref 8.9–10.3)
Creatinine, Ser: 1.2 mg/dL (ref 0.61–1.24)
Glucose, Bld: 105 mg/dL — ABNORMAL HIGH (ref 70–99)
POTASSIUM: 4.6 mmol/L (ref 3.5–5.1)
Sodium: 139 mmol/L (ref 135–145)
TOTAL PROTEIN: 6.2 g/dL — AB (ref 6.5–8.1)

## 2018-05-18 LAB — CBC
HCT: 47.6 % (ref 39.0–52.0)
Hemoglobin: 16.9 g/dL (ref 13.0–17.0)
MCH: 34.8 pg — AB (ref 26.0–34.0)
MCHC: 35.5 g/dL (ref 30.0–36.0)
MCV: 97.9 fL (ref 78.0–100.0)
PLATELETS: 166 10*3/uL (ref 150–400)
RBC: 4.86 MIL/uL (ref 4.22–5.81)
RDW: 13.7 % (ref 11.5–15.5)
WBC: 8.7 10*3/uL (ref 4.0–10.5)

## 2018-05-18 LAB — SALICYLATE LEVEL

## 2018-05-18 LAB — RAPID URINE DRUG SCREEN, HOSP PERFORMED
Amphetamines: NOT DETECTED
BENZODIAZEPINES: NOT DETECTED
Barbiturates: NOT DETECTED
Cocaine: NOT DETECTED
Tetrahydrocannabinol: NOT DETECTED

## 2018-05-18 LAB — ETHANOL

## 2018-05-18 LAB — ACETAMINOPHEN LEVEL: Acetaminophen (Tylenol), Serum: <10 ug/mL — ABNORMAL LOW (ref 10–30)

## 2018-05-18 LAB — VALPROIC ACID LEVEL: VALPROIC ACID LVL: 60 ug/mL (ref 50.0–100.0)

## 2018-05-18 MED ORDER — HALOPERIDOL 5 MG PO TABS
15.0000 mg | ORAL_TABLET | Freq: Every day | ORAL | Status: DC
  Administered 2018-05-18: 15 mg via ORAL
  Filled 2018-05-18: qty 3

## 2018-05-18 MED ORDER — QUETIAPINE FUMARATE 300 MG PO TABS
400.0000 mg | ORAL_TABLET | Freq: Every day | ORAL | Status: DC
  Administered 2018-05-18: 400 mg via ORAL
  Filled 2018-05-18: qty 1

## 2018-05-18 MED ORDER — MONTELUKAST SODIUM 10 MG PO TABS: 10.0000 mg | ORAL_TABLET | Freq: Every day | ORAL | Status: DC

## 2018-05-18 MED ORDER — METFORMIN HCL ER 500 MG PO TB24
500.0000 mg | ORAL_TABLET | Freq: Two times a day (BID) | ORAL | Status: DC
  Administered 2018-05-19: 500 mg via ORAL
  Filled 2018-05-18 (×3): qty 1

## 2018-05-18 MED ORDER — DIVALPROEX SODIUM ER 500 MG PO TB24
500.0000 mg | ORAL_TABLET | Freq: Every morning | ORAL | Status: DC
  Administered 2018-05-19: 500 mg via ORAL
  Filled 2018-05-18: qty 1

## 2018-05-18 MED ORDER — AMANTADINE HCL 100 MG PO CAPS
100.0000 mg | ORAL_CAPSULE | Freq: Two times a day (BID) | ORAL | Status: DC
  Administered 2018-05-18 – 2018-05-19 (×2): 100 mg via ORAL
  Filled 2018-05-18 (×2): qty 1

## 2018-05-18 MED ORDER — LORATADINE 10 MG PO TABS
10.0000 mg | ORAL_TABLET | Freq: Every day | ORAL | Status: DC
  Administered 2018-05-18 – 2018-05-19 (×2): 10 mg via ORAL
  Filled 2018-05-18 (×2): qty 1

## 2018-05-18 NOTE — ED Provider Notes (Signed)
[facility]-EMERGENCY DEPT Provider Note   CSN: [id number] Arrival date & time: 05/18/18  [id number]     History   Chief Complaint Chief Complaint  Patient presents with  . Suicidal  . Homicidal    HPI Ryan York is a 47 y.o. male.  HPI Patient presents suicidal and homicidal.  States he is hearing voices.  States they are telling him bad things.  States it is to the point where it is not the voices anymore it is just him.  States he feels very bad.  Suicidal thoughts without clear plan.  Also states he feels violent towards other people.  States he is a time bomb.  States he needs to go back to [place] which is where he should be.  Denies substance abuse.  History of bipolar and schizophrenia. Past Medical History:  Diagnosis Date  . Asthma   . Bipolar disorder (HCC)   . Hypertension   . Schizophrenia [facility](HCC)     Patient Active Problem List   Diagnosis Date Noted  . Asthma 08/05/2016  . Tobacco use disorder 04/04/2016  . Schizoaffective disorder, bipolar type (HCC) 04/20/2012    Past Surgical History:  Procedure Laterality Date  . KNEE SURGERY          Home Medications    Prior to Admission medications   Medication Sig Start Date End Date Taking? Authorizing Provider  amantadine (SYMMETREL) 100 MG capsule Take by mouth 2 (two) times daily.    [provider]  cetirizine (ZYRTEC) 10 MG tablet Take 10 mg by mouth daily.    [provider]  divalproex (DEPAKOTE ER) 500 MG 24 hr tablet Take 500 mg by mouth every morning.    [provider]  haloperidol (HALDOL) 5 MG tablet Take 15 mg by mouth at bedtime.    [provider]  metFORMIN (GLUCOPHAGE-XR) 500 MG 24 hr tablet Take 500 mg by mouth 2 (two) times daily.    [provider]  methocarbamol (ROBAXIN) 500 MG tablet Take 1 tablet (500 mg total) by mouth 2 (two) times daily. Patient not taking: Reported on 05/10/2018 11/02/17   Jaynie CrumbleKirichenko, Tatyana, PA-C    montelukast (SINGULAIR) 10 MG tablet Take 10 mg by mouth at bedtime.     [provider]  naproxen (NAPROSYN) 500 MG tablet Take 1 tablet (500 mg total) by mouth 2 (two) times daily. Patient not taking: Reported on 05/10/2018 11/02/17   Jaynie CrumbleKirichenko, Tatyana, PA-C  QUEtiapine (SEROQUEL XR) 400 MG 24 hr tablet Take 1 tablet (400 mg total) by mouth at bedtime. Patient not taking: Reported on 05/10/2018 08/06/16   Pucilowska, Braulio ConteJolanta B, MD  QUEtiapine (SEROQUEL) 400 MG tablet Take 400 mg by mouth at bedtime.    [provider]    Family History Family History  Problem Relation Age of Onset  . Alzheimer's disease Father     Social History Social History   Tobacco Use  . Smoking status: Current Some Day Smoker    Packs/day: 0.50    Years: 10.00    Pack years: 5.00    Types: Cigars  . Smokeless tobacco: Never Used  Substance Use Topics  . Alcohol use: No    Comment: denies alcohol use  . Drug use: No    Comment: UDS not available at time of writing     Allergies   Hydroxyzine hcl; Lithium; Risperidone and related; and Vistaril [hydroxyzine]   Review of Systems Review of Systems  Constitutional: Positive for  appetite change.  HENT: Negative for congestion.   Eyes: Negative for visual disturbance.  Respiratory: Negative for shortness of breath.   Cardiovascular: Negative for chest pain.  Gastrointestinal: Negative for abdominal pain.  Genitourinary: Negative for flank pain.  Musculoskeletal: Negative for back pain.  Skin: Negative for wound.  Neurological: Negative for seizures.  Hematological: Does not bruise/bleed easily.  Psychiatric/Behavioral: Positive for hallucinations and suicidal ideas. The patient is nervous/anxious.      Physical Exam Updated Vital Signs BP (!) 139/104 (BP Location: Right Arm)   Pulse 83   Temp 97.8 F (36.6 C) (Oral)   Resp 20   Ht 6\' 1"  (1.854 m)   Wt 99.8 kg   SpO2 99%   BMI 29.03 kg/m   Physical Exam   Constitutional: He appears well-developed.  HENT:  Head: Atraumatic.  Eyes: Pupils are equal, round, and reactive to light.  Neck: Neck supple.  Cardiovascular: Normal rate.  Pulmonary/Chest: Effort normal.  Abdominal: There is no tenderness.  Musculoskeletal: He exhibits no edema.  Neurological: He is alert.  Skin: Skin is warm. Capillary refill takes less than 2 seconds.  Psychiatric:  Patient is somewhat tearful.     ED Treatments / Results  Labs (all labs ordered are listed, but only abnormal results are displayed) Labs Reviewed  COMPREHENSIVE METABOLIC PANEL - Abnormal; Notable for the following components:      Result Value   Glucose, Bld 105 (*)    Calcium 8.4 (*)    Total Protein 6.2 (*)    Albumin 3.4 (*)    All other components within normal limits  ACETAMINOPHEN LEVEL - Abnormal; Notable for the following components:   Acetaminophen (Tylenol), Serum <10 (*)    All other components within normal limits  CBC - Abnormal; Notable for the following components:   MCH 34.8 (*)    All other components within normal limits  RAPID URINE DRUG SCREEN, HOSP PERFORMED - Abnormal; Notable for the following components:   Opiates   (*)    Value: Result not available. Reagent lot number recalled by manufacturer.   All other components within normal limits  ETHANOL  SALICYLATE LEVEL    EKG None  Radiology No results found.  Procedures Procedures (including critical care time)  Medications Ordered in ED Medications - No data to display   Initial Impression / Assessment and Plan / ED Course  I have reviewed the triage vital signs and the nursing notes.  Pertinent labs & imaging results that were available during my care of the patient were reviewed by me and considered in my medical decision making (see chart for details).     Patient was schizophrenia.  Now hallucinating with suicidal and potentially homicidal thoughts.  Medically cleared.  To be seen by TTS  per  Final Clinical Impressions(s) / ED Diagnoses   Final diagnoses:  Schizophrenia, unspecified type [facility](HCC)    ED Discharge Orders    None       Benjiman CorePickering, Sadao Weyer, MD 05/18/18 2012

## 2018-05-18 NOTE — BH Assessment (Addendum)
Tele Assessment Note   Patient Name: Ryan York MRN: [id number] Referring Physician: Benjiman Core, MD Location of Patient: [phone] Location of Provider: Behavioral Health TTS Department  Ryan York is an 47 y.o. male presenting with suicidal and homicidal ideations. Patient reports he is having command auditory hallucinations to hurt anyone in "the city limits of [place]". Patient reported the inability to breathe in the city limits of [place]. 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 Patient reported, "my bidy reacts, stating "my body can't deal with this like it use too". Patient is currently being seen at [facility]. Patient has diagnosis of Schizoaffective D/O. Patient reported being compliant with medication. Patient reports voices are telling him to do bad things. Patient reports that its him now and sometimes not the voices. Patient reported hearing voices even when he is asleep. States he feels very bad.  Suicidal thoughts without clear plan.  Also states he feels violent towards other people.  States he is a time bomb.  States he needs to go back to [place] which is where he should be.  Denies substance abuse.  History of bipolar and schizophrenia. Patient stated his body is not able to react like it used to and that he does not like it. Patient is receiving disability.  Patient was calm and cooperative during assessment. Patient had poor eye contact. Patient level of consciousness was sleepy, drowsy and sedated. Patient mood was sad and depressed.   Correction: Please note in suicidal screening below, patient is suicidal, as he is hearing voices to do bad things to himself and others.  Disposition:  Initial Assessment Completed for this Encounter: Yes  [name], NP, recommends overnight hold for observation and safety. Doctor and Leslie Andrea, RN, notified of disposition.   Diagnosis: Schizoaffective  Disorder  Past Medical History:  Past Medical History:  Diagnosis Date  . Asthma   . Bipolar disorder (HCC)   . Hypertension   . Schizophrenia [facility])     Past Surgical History:  Procedure Laterality Date  . KNEE SURGERY      Family History:  Family History  Problem Relation Age of Onset  . Alzheimer's disease Father     Social History:  reports that he has been smoking cigars. He has a 5.00 pack-year smoking history. He has never used smokeless tobacco. He reports that he does not drink alcohol or use drugs.  Additional Social History:  Alcohol / Drug Use Pain Medications: see MAR Prescriptions: see MAR Over the Counter: see MAR  CIWA: CIWA-Ar BP: (!) 146/95 Pulse Rate: 71 COWS:    Allergies:  Allergies  Allergen Reactions  . Hydroxyzine Hcl     Sexual difficulty   . Lithium Itching  . Risperidone And Related     "It shrinks my penis."   . Vistaril [Hydroxyzine] Nausea And Vomiting    Home Medications:  (Not in a hospital admission)  OB/GYN Status:  No LMP for male patient.  General Assessment Data [facility] of [facility]: [facility] TTS Assessment: In system Is this a Tele or Face-to-Face Assessment?: Face-to-Face Is this an Initial Assessment or a Re-assessment for this encounter?: Initial Assessment Marital status: Single Is patient pregnant?: No Pregnancy Status: No Living Arrangements: Alone Can pt return to current living arrangement?: Yes Admission Status: Voluntary Is patient capable of signing voluntary admission?: Yes Referral Source: Self/Family/Friend     Crisis Care Plan Living Arrangements: Alone Name of Psychiatrist: [facility] ACTT team Name of Therapist: [facility] ACTT team  Education Status Is patient currently in school?: No Is the patient employed, unemployed or receiving disability?: Receiving disability income  Risk to self with the past 6 months Suicidal Ideation: No Has patient been a risk to self  within the past 6 months prior to admission? : No Suicidal Intent: No Has patient had any suicidal intent within the past 6 months prior to admission? : No Is patient at risk for suicide?: No Suicidal Plan?: No Has patient had any suicidal plan within the past 6 months prior to admission? : No Access to Means: No What has been your use of drugs/alcohol within the last 12 months?: (none) Previous Attempts/Gestures: No How many times?: (0) Other Self Harm Risks: None Triggers for Past Attempts: None known Intentional Self Injurious Behavior: None Family Suicide History: No Recent stressful life event(s): ("[place]") Persecutory voices/beliefs?: Yes Depression: Yes Depression Symptoms: Guilt Substance abuse history and/or treatment for substance abuse?: No Suicide prevention information given to non-admitted patients: Not applicable  Risk to Others within the past 6 months Homicidal Ideation: Yes-Currently Present Does patient have any lifetime risk of violence toward others beyond the six months prior to admission? : No Thoughts of Harm to Others: Yes-Currently Present Comment - Thoughts of Harm to Others: (voices to harm others) Current Homicidal Intent: No Current Homicidal Plan: No Access to Homicidal Means: (unknown) Identified Victim: ("anybody in the [name] History of harm to others?: No Assessment of Violence: None Noted Violent Behavior Description: (none) Does patient have access to weapons?: No Criminal Charges Pending?: No Does patient have a court date: No Is patient on probation?: No  Psychosis Hallucinations: Auditory, Visual(voices to harm others and see people) Delusions: (continual voices)  Mental Status Report Appearance/Hygiene: Unremarkable, In scrubs Eye Contact: Poor Motor Activity: Freedom of movement Speech: Logical/coherent Level of Consciousness: Sleeping, Drowsy, Sedated Mood: Depressed, Helpless, Sad Affect: Sad,  Depressed Thought Processes: Coherent, Relevant Judgement: Impaired Orientation: Appropriate for developmental age Obsessive Compulsive Thoughts/Behaviors: None  Cognitive Functioning Concentration: Fair Memory: Recent Intact Is patient IDD: No Is patient DD?: No Insight: Poor Impulse Control: Fair Appetite: Good Have you had any weight changes? : No Change Sleep: No Change Total Hours of Sleep: (8-10 hours) Vegetative Symptoms: Staying in bed  ADLScreening [facility] Assessment Services) Patient's cognitive ability adequate to safely complete daily activities?: Yes Patient able to express need for assistance with ADLs?: Yes Independently performs ADLs?: Yes (appropriate for developmental age)  Prior Inpatient Therapy Prior Inpatient Therapy: Yes Prior Therapy Dates: 07/2016 [facility] Reason for Treatment: SI  Prior Outpatient Therapy Prior Outpatient Therapy: Yes Prior Therapy Dates: Since 10/2016 Prior Therapy Facilty/Provider(s): [facility] Reason for Treatment: ACTT team Does patient have an ACCT team?: Yes Does patient have Intensive In-House Services?  : No Does patient have [facility] services? : No Does patient have P4CC services?: No  ADL Screening (condition at time of admission) Patient's cognitive ability adequate to safely complete daily activities?: Yes Patient able to express need for assistance with ADLs?: Yes Independently performs ADLs?: Yes (appropriate for developmental age)             Merchant navy officerAdvance Directives (For Healthcare) Does Patient Have a Medical Advance Directive?: No Would patient like information on creating a medical advance directive?: No - Patient declined          Disposition:  Disposition Initial Assessment Completed for this Encounter: Yes  [name], NP, recommends overnight hold  for observation and safety. Doctor and Leslie Andreaashell, RN, notified of disposition.   This service was provided  via telemedicine using a 2-way, interactive audio and video technology.  Names of all persons participating in this telemedicine service and their role in this encounter. Name: Ryan York Role: patient  Name: Tyrell Antonioashell McLean, RN Role: nurse  Name: Al CorpusLatisha Jerrine Urschel, [facility] Role: TTS Clinician  Name:        Burnetta SabinLatisha D Kuulei Kleier 05/18/2018 11:04 PM

## 2018-05-18 NOTE — ED Notes (Signed)
[facility] Expected date:  Expected time:  Means of arrival:  Comments: [place] from triage

## 2018-05-18 NOTE — ED Triage Notes (Signed)
Patient reports suicidal thoughts without a plan. patient states I feel violent and I have been hitting my head and arms on the wall. Patient states that he feels like hurting other people. " I hear voices and see people trying to touch me and disrespect my manhood." I feel like I want to snap [place] Outside of a hospital I am a ticking time bomb." Patient denies any alcohol or drug use.

## 2018-05-18 NOTE — BH Assessment (Signed)
[name], NP, recommends overnight hold for observation and safety. Doctor and Leslie Andreaashell, RN, notified of disposition.

## 2018-05-18 NOTE — ED Notes (Signed)
Patient's belongings are in the cabinets marked "Res A" and book bags are below desk with labels on all bags. Primary RN notified of where belongings were placed.

## 2018-05-18 NOTE — ED Notes (Signed)
Bed: [facility] Expected date:  Expected time:  Means of arrival:  Comments: 

## 2018-05-18 NOTE — ED Notes (Signed)
Patient reports he is a vegetarian.

## 2018-05-19 ENCOUNTER — Encounter (HOSPITAL_COMMUNITY): Payer: Self-pay | Admitting: Registered Nurse

## 2018-05-19 DIAGNOSIS — F250 Schizoaffective disorder, bipolar type: Secondary | ICD-10-CM

## 2018-05-19 NOTE — Consult Note (Addendum)
[facility] Psych ED Discharge  05/19/2018 11:05 AM Ryan York  MRN:  [id number] Principal Problem: Schizoaffective disorder, bipolar type [facility]) Discharge Diagnoses: Schizoaffective disorder, bipolar type Patient Active Problem List   Diagnosis Date Noted  . Asthma [J45.909] 08/05/2016  . Tobacco use disorder [F17.200] 04/04/2016  . Schizoaffective disorder, bipolar type (HCC) [F25.0] 04/20/2012    Subjective: Patient presents to [facility] with complaints suicidal/homicidal ideation and command auditory hallucinations telling him to hurt anyone.   Ryan York, 47 y.o., male patient seen face to face by this provider; chart reviewed and discussed with Dr. Sharma Covert and treatment team on 05/19/18.  On evaluation Ryan York reports that he came to the ED because he needed a break from being in [place]. He reports that "cell phone towers are killing him." He endorses AH that "talk trash" to him although he does not appear to be responding to internal stimuli.  He reports medication compliance. Patient denies suicidal/self-harm/homicidal ideation.  Total Time spent with patient: 30 minutes  Past Psychiatric History: Schizoaffective disorder  Past Medical History:  Past Medical History:  Diagnosis Date  . Asthma   . Bipolar disorder (HCC)   . Hypertension   . Schizophrenia [facility])     Past Surgical History:  Procedure Laterality Date  . KNEE SURGERY     Family History:  Family History  Problem Relation Age of Onset  . Alzheimer's disease Father    Family Psychiatric  History: See above.  Social History:  Social History   Substance and Sexual Activity  Alcohol Use No   Comment: denies alcohol use     Social History   Substance and Sexual Activity  Drug Use No   Comment: UDS not available at time of writing    Social History   Socioeconomic History  . Marital status: Single    Spouse name: Not on file  . Number of children: Not on file  . Years of education: Not  on file  . Highest education level: Not on file  Occupational History  . Not on file  Social Needs  . Financial resource strain: Not on file  . Food insecurity:    Worry: Not on file    Inability: Not on file  . Transportation needs:    Medical: Not on file    Non-medical: Not on file  Tobacco Use  . Smoking status: Current Some Day Smoker    Packs/day: 0.50    Years: 10.00    Pack years: 5.00    Types: Cigars  . Smokeless tobacco: Never Used  Substance and Sexual Activity  . Alcohol use: No    Comment: denies alcohol use  . Drug use: No    Comment: UDS not available at time of writing  . Sexual activity: Not on file  Lifestyle  . Physical activity:    Days per week: Not on file    Minutes per session: Not on file  . Stress: Not on file  Relationships  . Social connections:    Talks on phone: Not on file    Gets together: Not on file    Attends religious service: Not on file    Active member of club or organization: Not on file    Attends meetings of clubs or organizations: Not on file    Relationship status: Not on file  Other Topics Concern  . Not on file  Social History Narrative  . Not on file    Has this patient used  any form of tobacco in the last 30 days? (Cigarettes, Smokeless Tobacco, Cigars, and/or Pipes) A prescription for an FDA-approved tobacco cessation medication was offered at discharge and the patient refused  Current Medications: Current Facility-Administered Medications  Medication Dose Route Frequency Provider Last Rate Last Dose  . amantadine (SYMMETREL) capsule 100 mg  100 mg Oral BID Benjiman CorePickering, Nathan, MD   100 mg at 05/19/18 1051  . divalproex (DEPAKOTE ER) 24 hr tablet 500 mg  500 mg Oral q morning - 10a Benjiman CorePickering, Nathan, MD   500 mg at 05/19/18 1051  . haloperidol (HALDOL) tablet 15 mg  15 mg Oral Renee HarderQHS Pickering, Nathan, MD   15 mg at 05/18/18 2123  . loratadine (CLARITIN) tablet 10 mg  10 mg Oral Daily Benjiman CorePickering, Nathan, MD   10 mg at  05/19/18 1052  . metFORMIN (GLUCOPHAGE-XR) 24 hr tablet 500 mg  500 mg Oral BID WC Benjiman CorePickering, Nathan, MD   500 mg at 05/19/18 0932  . montelukast (SINGULAIR) tablet 10 mg  10 mg Oral QHS Benjiman CorePickering, Nathan, MD      . QUEtiapine (SEROQUEL) tablet 400 mg  400 mg Oral Renee HarderQHS Pickering, Nathan, MD   400 mg at 05/18/18 2123   Current Outpatient Medications  Medication Sig Dispense Refill  . amantadine (SYMMETREL) 100 MG capsule Take by mouth 2 (two) times daily.    . cetirizine (ZYRTEC) 10 MG tablet Take 10 mg by mouth daily.    . divalproex (DEPAKOTE ER) 500 MG 24 hr tablet Take 500 mg by mouth every morning.    . haloperidol (HALDOL) 5 MG tablet Take 15 mg by mouth at bedtime.    . metFORMIN (GLUCOPHAGE-XR) 500 MG 24 hr tablet Take 500 mg by mouth 2 (two) times daily.    . montelukast (SINGULAIR) 10 MG tablet Take 10 mg by mouth at bedtime.   0  . QUEtiapine (SEROQUEL) 400 MG tablet Take 400 mg by mouth at bedtime.     PTA Medications:  (Not in a hospital admission)  Musculoskeletal: Strength & Muscle Tone: within normal limits Gait & Station: normal Patient leans: N/A  Psychiatric Specialty Exam: Physical Exam  Nursing note and vitals reviewed. Constitutional: He is oriented to person, place, and time. He appears well-developed and well-nourished.  HENT:  Head: Normocephalic and atraumatic.  Neck: Normal range of motion.  Respiratory: Effort normal.  Musculoskeletal: Normal range of motion.  Neurological: He is alert and oriented to person, place, and time.  Skin: No rash noted.  Psychiatric: He has a normal mood and affect. His speech is normal and behavior is normal. Judgment and thought content normal. Cognition and memory are normal.    Review of Systems  Psychiatric/Behavioral: Negative for substance abuse.  All other systems reviewed and are negative.   Blood pressure 134/85, pulse 60, temperature 98.3 F (36.8 C), temperature source Oral, resp. rate 18, height 6\' 1"  (1.854  m), weight 99.8 kg, SpO2 95 %.Body mass index is 29.03 kg/m.  General Appearance: Casual  Eye Contact:  Good  Speech:  Clear and Coherent and Normal Rate  Volume:  Normal  Mood:  Appropriate  Affect:  Congruent  Thought Process:  Coherent, Goal Directed and Descriptions of Associations: Intact  Orientation:  Full (Time, Place, and Person)  Thought Content:  Logical  Suicidal Thoughts:  No  Homicidal Thoughts:  No  Memory:  Immediate;   Good Recent;   Good Remote;   Good  Judgement:  Intact  Insight:  Fair  Psychomotor Activity:  Normal  Concentration:  Concentration: Good and Attention Span: Good  Recall:  Good  Fund of Knowledge:  Fair  Language:  Good  Akathisia:  No  Handed:  Right  AIMS (if indicated):   N/A  Assets:  Communication Skills Desire for Improvement  ADL's:  Intact  Cognition:  WNL  Sleep:   N/A     Demographic Factors:  Male  Loss Factors: NA  Historical Factors: Impulsivity  Risk Reduction Factors:   NA  Continued Clinical Symptoms:  Previous Psychiatric Diagnoses and Treatments  Cognitive Features That Contribute To Risk:  None    Suicide Risk:  Minimal: No identifiable suicidal ideation.  Patients presenting with no risk factors but with morbid ruminations; may be classified as minimal risk based on the severity of the depressive symptoms    Plan Of Care/Follow-up recommendations:  Activity:  As tolerated Diet:  Heart healthy  Disposition: Psychiatrically cleared for discharge home. -Patient will follow up with outpatient provider for medication management.   Shuvon Rankin, NP 05/19/2018, 11:05 AM   Patient seen face-to-face for psychiatric evaluation, chart reviewed and case discussed with the physician extender and developed treatment plan. Reviewed the information documented and agree with the treatment plan.  Juanetta BeetsJacqueline Aprille Sawhney, DO 05/19/18 4:19 PM

## 2018-05-19 NOTE — BH Assessment (Signed)
[facility] Assessment Progress Note  Per Jacqueline Norman, DO, this pt does not require psychiatric hospitalization at this time.  Pt is to be discharged from [facility] with recommendation to continue treatment with the [facility].  This has been included in pt's discharge instructions.  Pt's nurse, Edie, has been notified.  Sheri Gatchel, MA Triage Specialist [phone]     

## 2018-05-19 NOTE — ED Notes (Signed)
Pt discharged safely with discharge instructions.  All belongings were returned to pt.  Pt stated "Ok ,  I'll just try again."   Pt would not elaborate on his comment.  Pt was in no distress.

## 2018-05-19 NOTE — Discharge Instructions (Signed)
For your behavioral health needs, you are advised to continue treatment with the [facility]: ° °     [facility] °     [address] °     [place], [place] [address] °     [phone] °

## 2018-09-10 ENCOUNTER — Other Ambulatory Visit: Payer: Self-pay

## 2018-09-10 ENCOUNTER — Emergency Department (HOSPITAL_COMMUNITY)
Admission: EM | Admit: 2018-09-10 | Discharge: 2018-09-11 | Disposition: A | Discharge status: Home / Self Care | Payer: Medicaid Other | Attending: Emergency Medicine | Admitting: Emergency Medicine

## 2018-09-10 ENCOUNTER — Encounter (HOSPITAL_COMMUNITY): Payer: Self-pay

## 2018-09-10 DIAGNOSIS — R443 Hallucinations, unspecified: Secondary | ICD-10-CM | POA: Diagnosis present

## 2018-09-10 DIAGNOSIS — J45909 Unspecified asthma, uncomplicated: Secondary | ICD-10-CM | POA: Insufficient documentation

## 2018-09-10 DIAGNOSIS — R0602 Shortness of breath: Secondary | ICD-10-CM | POA: Insufficient documentation

## 2018-09-10 DIAGNOSIS — I10 Essential (primary) hypertension: Secondary | ICD-10-CM | POA: Diagnosis not present

## 2018-09-10 DIAGNOSIS — F17210 Nicotine dependence, cigarettes, uncomplicated: Secondary | ICD-10-CM | POA: Diagnosis not present

## 2018-09-10 DIAGNOSIS — F250 Schizoaffective disorder, bipolar type: Secondary | ICD-10-CM | POA: Diagnosis present

## 2018-09-10 LAB — CBC
HEMATOCRIT: 44.7 % (ref 39.0–52.0)
Hemoglobin: 14.8 g/dL (ref 13.0–17.0)
MCH: 33.9 pg (ref 26.0–34.0)
MCHC: 33.1 g/dL (ref 30.0–36.0)
MCV: 102.5 fL — ABNORMAL HIGH (ref 80.0–100.0)
Platelets: 194 10*3/uL (ref 150–400)
RBC: 4.36 MIL/uL (ref 4.22–5.81)
RDW: 14.1 % (ref 11.5–15.5)
WBC: 8.1 10*3/uL (ref 4.0–10.5)
nRBC: 0.2 % (ref 0.0–0.2)

## 2018-09-10 MED ORDER — ALBUTEROL SULFATE HFA 108 (90 BASE) MCG/ACT IN AERS: 2.0000 | INHALATION_SPRAY | Freq: Once | RESPIRATORY_TRACT | Status: DC

## 2018-09-10 MED ORDER — AEROCHAMBER PLUS FLO-VU MEDIUM MISC: 1.0000 | Freq: Once | Status: DC

## 2018-09-10 NOTE — ED Notes (Signed)
Bed: WTR5 Expected date:  Expected time:  Means of arrival:  Comments: 

## 2018-09-10 NOTE — ED Provider Notes (Signed)
[facility]-EMERGENCY DEPT Provider Note   CSN: [id number] Arrival date & time: 09/10/18  2224     History   Chief Complaint Chief Complaint  Patient presents with  . Hallucinations    HPI Ryan York is a 47 y.o. male with history of hypertension, asthma, bipolar disorder, schizophrenia who presents with a two-month history of significantly worsening hallucinations.  Patient reports he has been dealing with hallucinations for 7 or 8 years, however they have been very bad lately.  He reports he cannot take it anymore.  He reports he is hearing voices 24/7.  He reports he has been feeling demons touches penis and in his bottom.  He denies any SI or HI.  He has been drinking to try and deal with problems, however it has not helped.  He does drink 2 pints of liquor in the past 2 weeks.  He denies any chest pain.  He reports he has had some intermittent shortness of breath related to his asthma lately and does not know how to use an inhaler.  He denies any shortness of breath or wheezing right now. He denies any abdominal pain, nausea, vomiting.  He denies any drug use.  He has been taking Depakote and Seroquel without relief, although he reported to the pharmacy tech that he is not taking anything.  HPI  Past Medical History:  Diagnosis Date  . Asthma   . Bipolar disorder (HCC)   . Hypertension   . Schizophrenia [facility](HCC)     Patient Active Problem List   Diagnosis Date Noted  . Asthma 08/05/2016  . Tobacco use disorder 04/04/2016  . Schizoaffective disorder, bipolar type (HCC) 04/20/2012    Past Surgical History:  Procedure Laterality Date  . KNEE SURGERY          Home Medications    Prior to Admission medications   Not on File    Family History Family History  Problem Relation Age of Onset  . Alzheimer's disease Father     Social History Social History   Tobacco Use  . Smoking status: Current Some Day Smoker    Packs/day: 0.50   Years: 10.00    Pack years: 5.00    Types: Cigars  . Smokeless tobacco: Never Used  Substance Use Topics  . Alcohol use: No    Comment: denies alcohol use  . Drug use: No    Comment: UDS not available at time of writing     Allergies   Hydroxyzine hcl; Lithium; Risperidone and related; and Vistaril [hydroxyzine]   Review of Systems Review of Systems  Constitutional: Negative for chills and fever.  HENT: Negative for facial swelling and sore throat.   Respiratory: Positive for shortness of breath (intermittent) and wheezing.   Cardiovascular: Negative for chest pain.  Gastrointestinal: Negative for abdominal pain, nausea and vomiting.  Genitourinary: Negative for dysuria.  Musculoskeletal: Negative for back pain.  Skin: Negative for rash and wound.  Neurological: Negative for headaches.  Psychiatric/Behavioral: The patient is not nervous/anxious.      Physical Exam Updated Vital Signs BP (!) 138/92 (BP Location: Right Arm)   Pulse 80   Temp 98.5 F (36.9 C) (Oral)   Resp 16   Ht 6\' 1"  (1.854 m)   Wt 97.5 kg   SpO2 96%   BMI 28.37 kg/m   Physical Exam Vitals signs and nursing note reviewed.  Constitutional:      General: He is not in acute distress.  Appearance: He is well-developed. He is not diaphoretic.  HENT:     Head: Normocephalic and atraumatic.     Mouth/Throat:     Pharynx: No oropharyngeal exudate.  Eyes:     General: No scleral icterus.       Right eye: No discharge.        Left eye: No discharge.     Conjunctiva/sclera: Conjunctivae normal.     Pupils: Pupils are equal, round, and reactive to light.  Neck:     Musculoskeletal: Normal range of motion and neck supple.     Thyroid: No thyromegaly.  Cardiovascular:     Rate and Rhythm: Normal rate and regular rhythm.     Heart sounds: Normal heart sounds. No murmur. No friction rub. No gallop.   Pulmonary:     Effort: Pulmonary effort is normal. No respiratory distress.     Breath sounds:  Normal breath sounds. No stridor. No wheezing or rales.  Abdominal:     General: Bowel sounds are normal. There is no distension.     Palpations: Abdomen is soft.     Tenderness: There is no abdominal tenderness. There is no guarding or rebound.  Lymphadenopathy:     Cervical: No cervical adenopathy.  Skin:    General: Skin is warm and dry.     Coloration: Skin is not pale.     Findings: No rash.  Neurological:     Mental Status: He is alert.     Coordination: Coordination normal.  Psychiatric:        Attention and Perception: He perceives auditory and visual hallucinations.        Speech: Speech normal.        Thought Content: Thought content does not include homicidal or suicidal ideation.      ED Treatments / Results  Labs (all labs ordered are listed, but only abnormal results are displayed) Labs Reviewed  COMPREHENSIVE METABOLIC PANEL - Abnormal; Notable for the following components:      Result Value   Calcium 8.8 (*)    All other components within normal limits  ACETAMINOPHEN LEVEL - Abnormal; Notable for the following components:   Acetaminophen (Tylenol), Serum <10 (*)    All other components within normal limits  CBC - Abnormal; Notable for the following components:   MCV 102.5 (*)    All other components within normal limits  VALPROIC ACID LEVEL - Abnormal; Notable for the following components:   Valproic Acid Lvl 37 (*)    All other components within normal limits  ETHANOL  SALICYLATE LEVEL  RAPID URINE DRUG SCREEN, HOSP PERFORMED    EKG None  Radiology No results found.  Procedures Procedures (including critical care time)  Medications Ordered in ED Medications  albuterol (PROVENTIL HFA;VENTOLIN HFA) 108 (90 Base) MCG/ACT inhaler 2 puff (2 puffs Inhalation Refused 09/11/18 0114)  AEROCHAMBER PLUS FLO-VU MEDIUM MISC 1 each (1 each Other Refused 09/11/18 0114)  ziprasidone (GEODON) injection 20 mg ( Intramuscular Refused 09/11/18 0510)  LORazepam  (ATIVAN) injection 2 mg ( Intramuscular Refused 09/11/18 0510)  traZODone (DESYREL) tablet 100 mg (100 mg Oral Given 09/11/18 0511)  sterile water (preservative free) injection (  Refused 09/11/18 0510)  QUEtiapine (SEROQUEL) tablet 100 mg (100 mg Oral Given 09/11/18 0510)     Initial Impression / Assessment and Plan / ED Course  I have reviewed the triage vital signs and the nursing notes.  Pertinent labs & imaging results that were available during my care of the  patient were reviewed by me and considered in my medical decision making (see chart for details).     Patient with worsening hallucinations.  He denies any SI and HI.  Patient reports history of asthma and does not have an inhaler.  He has had intermittent shortness of breath and wheezing recently.  None at present.  Lungs are clear to auscultation.  Albuterol inhaler and spacer ordered.  TTS recommends inpatient treatment.  Patient is medically cleared.  Disposition determined by TTS placement.  Final Clinical Impressions(s) / ED Diagnoses   Final diagnoses:  Hallucinations  Shortness of breath    ED Discharge Orders    None       Emi Holes, PA-C 09/11/18 0730    Derwood Kaplan, MD 09/11/18 [phone]

## 2018-09-10 NOTE — ED Triage Notes (Addendum)
Pt arrives voluntarily with [employer]. He is requesting that his medications be adjusted. He states that he is schizophrenic and has been hearing more voices lately. He states that the voices are trying to annoy him and get under his skin and mess with his manhood. Denies SI/HI.

## 2018-09-11 LAB — VALPROIC ACID LEVEL: Valproic Acid Lvl: 37 ug/mL — ABNORMAL LOW (ref 50.0–100.0)

## 2018-09-11 LAB — RAPID URINE DRUG SCREEN, HOSP PERFORMED
Amphetamines: NOT DETECTED
Barbiturates: NOT DETECTED
Benzodiazepines: NOT DETECTED
COCAINE: NOT DETECTED
Opiates: NOT DETECTED
Tetrahydrocannabinol: NOT DETECTED

## 2018-09-11 LAB — COMPREHENSIVE METABOLIC PANEL
ALT: 22 U/L (ref 0–44)
AST: 18 U/L (ref 15–41)
Albumin: 3.8 g/dL (ref 3.5–5.0)
Alkaline Phosphatase: 52 U/L (ref 38–126)
Anion gap: 8 (ref 5–15)
BUN: 15 mg/dL (ref 6–20)
CO2: 25 mmol/L (ref 22–32)
Calcium: 8.8 mg/dL — ABNORMAL LOW (ref 8.9–10.3)
Chloride: 107 mmol/L (ref 98–111)
Creatinine, Ser: 1.12 mg/dL (ref 0.61–1.24)
GFR calc Af Amer: >60 mL/min (ref >60)
GFR calc non Af Amer: >60 mL/min (ref >60)
Glucose, Bld: 96 mg/dL (ref 70–99)
Potassium: 4.1 mmol/L (ref 3.5–5.1)
Sodium: 140 mmol/L (ref 135–145)
Total Bilirubin: 0.7 mg/dL (ref 0.3–1.2)
Total Protein: 7.2 g/dL (ref 6.5–8.1)

## 2018-09-11 LAB — SALICYLATE LEVEL

## 2018-09-11 LAB — ETHANOL: Alcohol, Ethyl (B): <10 mg/dL (ref <10)

## 2018-09-11 LAB — ACETAMINOPHEN LEVEL: Acetaminophen (Tylenol), Serum: <10 ug/mL — ABNORMAL LOW (ref 10–30)

## 2018-09-11 MED ORDER — ZOLPIDEM TARTRATE 5 MG PO TABS
5.0000 mg | ORAL_TABLET | Freq: Every evening | ORAL | Status: DC | PRN
  Administered 2018-09-11: 5 mg via ORAL
  Filled 2018-09-11: qty 1

## 2018-09-11 MED ORDER — STERILE WATER FOR INJECTION IJ SOLN
INTRAMUSCULAR | Status: AC
  Filled 2018-09-11: qty 10

## 2018-09-11 MED ORDER — TRAZODONE HCL 100 MG PO TABS
100.0000 mg | ORAL_TABLET | Freq: Every evening | ORAL | Status: DC | PRN
  Administered 2018-09-11: 100 mg via ORAL
  Filled 2018-09-11: qty 1

## 2018-09-11 MED ORDER — LORAZEPAM 2 MG/ML IJ SOLN
INTRAMUSCULAR | Status: AC
  Filled 2018-09-11: qty 1

## 2018-09-11 MED ORDER — LORAZEPAM 2 MG/ML IJ SOLN: 2.0000 mg | Freq: Once | INTRAMUSCULAR | Status: DC

## 2018-09-11 MED ORDER — ZIPRASIDONE MESYLATE 20 MG IM SOLR
INTRAMUSCULAR | Status: AC
  Filled 2018-09-11: qty 20

## 2018-09-11 MED ORDER — QUETIAPINE FUMARATE 100 MG PO TABS
100.0000 mg | ORAL_TABLET | Freq: Every day | ORAL | Status: DC
  Administered 2018-09-11: 100 mg via ORAL
  Filled 2018-09-11: qty 1

## 2018-09-11 MED ORDER — ZIPRASIDONE MESYLATE 20 MG IM SOLR: 20.0000 mg | Freq: Once | INTRAMUSCULAR | Status: DC
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
## 2018-09-11 NOTE — ED Notes (Signed)
Patient to see peer support today per nurse practitioner prior to discharge.

## 2018-09-11 NOTE — ED Notes (Addendum)
Patient verbally aggressive toward staff because the Seroquel did not help him sleep.  Patient observed masturbating in his room earlier in the shift.

## 2018-09-11 NOTE — ED Notes (Signed)
Lab stated that they would add on the Valproic acid

## 2018-09-11 NOTE — ED Notes (Signed)
Pt refused vitals signs from nursing staff pts nurse Aram Beechamcynthia was notified

## 2018-09-11 NOTE — ED Notes (Signed)
[facility] Expected date:  Expected time:  Means of arrival:  Comments: [place]

## 2018-09-11 NOTE — [facility] Suicide Risk Assessment (Signed)
Suicide Risk Assessment  Discharge Assessment   [facility]   Principal Problem: Schizoaffective disorder, bipolar type [facility](HCC) Discharge Diagnoses: Principal Problem:   Schizoaffective disorder, bipolar type (HCC)   Total Time spent with patient: 45 minutes  Musculoskeletal: Strength & Muscle Tone: within normal limits Gait & Station: normal Patient leans: N/A  Psychiatric Specialty Exam:   Blood pressure (!) 138/92, pulse 80, temperature 98.5 F (36.9 C), temperature source Oral, resp. rate 16, height 6\' 1"  (1.854 m), weight 97.5 kg, SpO2 96 %.Body mass index is 28.37 kg/m.  General Appearance: Casual  Eye Contact::  Good  Speech:  Normal Rate409  Volume:  Normal  Mood:  Irritable  Affect:  Congruent  Thought Process:  Coherent and Descriptions of Associations: Intact  Orientation:  Full (Time, Place, and Person)  Thought Content:  WDL and Logical  Suicidal Thoughts:  No  Homicidal Thoughts:  No  Memory:  Immediate;   Good Recent;   Good Remote;   Good  Judgement:  Fair  Insight:  Fair  Psychomotor Activity:  Normal  Concentration:  Good  Recall:  Good  Fund of Knowledge:Fair  Language: Good  Akathisia:  No  Handed:  Right  AIMS (if indicated):     Assets:  Housing Leisure Time Physical Health Resilience Social Support  Sleep:     Cognition: WNL  ADL's:  Intact   Mental Status Per Nursing Assessment::   On Admission:   47 yo male who presented to ED with delusions of something being in his apartment.  No suicidal/homicidal ideations, hallucinations, or substance abuse.  His ACT team, [facility], was contacted.  Stable for discharge.  Demographic Factors:  Male  Loss Factors: NA  Historical Factors: NA  Risk Reduction Factors:   Sense of responsibility to family, Living with another person, especially a relative, Positive social support and Positive therapeutic relationship  Continued Clinical Symptoms:   Irritability  Cognitive Features That Contribute To Risk:  None    Suicide Risk:  Minimal: No identifiable suicidal ideation.  Patients presenting with no risk factors but with morbid ruminations; may be classified as minimal risk based on the severity of the depressive symptoms    Plan Of Care/Follow-up recommendations:  Activity:  as tolerated Diet:  heart healthy diet  LORD, JAMISON, NP 09/11/2018, 11:51 AM
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
## 2018-10-12 ENCOUNTER — Other Ambulatory Visit: Payer: Self-pay

## 2018-10-12 ENCOUNTER — Encounter (HOSPITAL_COMMUNITY): Payer: Self-pay | Admitting: *Deleted

## 2018-10-12 DIAGNOSIS — M62830 Muscle spasm of back: Secondary | ICD-10-CM | POA: Diagnosis not present

## 2018-10-12 DIAGNOSIS — J45909 Unspecified asthma, uncomplicated: Secondary | ICD-10-CM | POA: Insufficient documentation

## 2018-10-12 DIAGNOSIS — F17210 Nicotine dependence, cigarettes, uncomplicated: Secondary | ICD-10-CM | POA: Diagnosis not present

## 2018-10-12 DIAGNOSIS — I10 Essential (primary) hypertension: Secondary | ICD-10-CM | POA: Insufficient documentation

## 2018-10-12 DIAGNOSIS — M5489 Other dorsalgia: Secondary | ICD-10-CM | POA: Diagnosis present

## 2018-10-12 NOTE — ED Triage Notes (Signed)
Pt reports left sided back pain and left rib pain x couple of days.  Pt denies any trauma.  Pt did some crunches a few days ago but did not report doing anything more strenuous.  Hx Schizoaffective disorder.  Pt ambulatory in triage.

## 2018-10-13 ENCOUNTER — Emergency Department (HOSPITAL_COMMUNITY)
Admission: EM | Admit: 2018-10-13 | Discharge: 2018-10-13 | Disposition: A | Discharge status: Home / Self Care | Payer: Medicaid Other | Attending: Emergency Medicine | Admitting: Emergency Medicine

## 2018-10-13 DIAGNOSIS — M62830 Muscle spasm of back: Secondary | ICD-10-CM

## 2018-10-13 MED ORDER — IBUPROFEN 400 MG PO TABS: 400.0000 mg | ORAL_TABLET | Freq: Four times a day (QID) | ORAL | 0 refills | Status: DC | PRN

## 2018-10-13 MED ORDER — METHOCARBAMOL 500 MG PO TABS: 500.0000 mg | ORAL_TABLET | Freq: Two times a day (BID) | ORAL | 0 refills | Status: DC

## 2018-10-13 NOTE — ED Provider Notes (Signed)
Ryan York   CSN: [id number] Arrival date & time: 10/12/18  2219     History   Chief Complaint Chief Complaint  Patient presents with  . Back Pain    HPI Ryan York is a 48 y.o. male.  48 year old male presents with back pain that occurred after he was doing sit ups.  Pain is sharp and localized to his thoracic area does not radiate down his legs.  No loss of bowel or bladder function.  Pain better with remaining still no treatment used prior to arrival.     Past Medical History:  Diagnosis Date  . Asthma   . Bipolar disorder (HCC)   . Hypertension   . Schizophrenia [facility](HCC)     Patient Active Problem List   Diagnosis Date Noted  . Asthma 08/05/2016  . Tobacco use disorder 04/04/2016  . Schizoaffective disorder, bipolar type (HCC) 04/20/2012    Past Surgical History:  Procedure Laterality Date  . KNEE SURGERY          Home Medications    Prior to Admission medications   Not on File    Family History Family History  Problem Relation Age of Onset  . Alzheimer's disease Father     Social History Social History   Tobacco Use  . Smoking status: Current Some Day Smoker    Packs/day: 0.50    Years: 10.00    Pack years: 5.00    Types: Cigars  . Smokeless tobacco: Never Used  Substance Use Topics  . Alcohol use: No    Comment: denies alcohol use  . Drug use: No    Comment: UDS not available at time of writing     Allergies   Hydroxyzine hcl; Lithium; Risperidone and related; and Vistaril [hydroxyzine]   Review of Systems Review of Systems  All other systems reviewed and are negative.    Physical Exam Updated Vital Signs BP (!) 140/100 (BP Location: Left Arm)   Pulse 74   Temp 97.7 F (36.5 C) (Oral)   Resp 18   Ht 1.854 m (6\' 1" )   Wt 97.5 kg   SpO2 100%   BMI 28.37 kg/m   Physical Exam Vitals signs and nursing York reviewed.  Constitutional:      General: He is not in  acute distress.    Appearance: Normal appearance. He is well-developed. He is not toxic-appearing.  HENT:     Head: Normocephalic and atraumatic.  Eyes:     General: Lids are normal.     Conjunctiva/sclera: Conjunctivae normal.     Pupils: Pupils are equal, round, and reactive to light.  Neck:     Musculoskeletal: Normal range of motion and neck supple.     Thyroid: No thyroid mass.     Trachea: No tracheal deviation.  Cardiovascular:     Rate and Rhythm: Normal rate and regular rhythm.     Heart sounds: Normal heart sounds. No murmur. No gallop.   Pulmonary:     Effort: Pulmonary effort is normal. No respiratory distress.     Breath sounds: Normal breath sounds. No stridor. No decreased breath sounds, wheezing, rhonchi or rales.  Abdominal:     General: Bowel sounds are normal. There is no distension.     Palpations: Abdomen is soft.     Tenderness: There is no abdominal tenderness. There is no rebound.  Musculoskeletal: Normal range of motion.        General: No tenderness.  Back:  Skin:    General: Skin is warm and dry.     Findings: No abrasion or rash.  Neurological:     Mental Status: He is alert and oriented to person, place, and time.     GCS: GCS eye subscore is 4. GCS verbal subscore is 5. GCS motor subscore is 6.     Cranial Nerves: No cranial nerve deficit.     Sensory: No sensory deficit.  Psychiatric:        Speech: Speech normal.        Behavior: Behavior normal.      ED Treatments / Results  Labs (all labs ordered are listed, but only abnormal results are displayed) Labs Reviewed - No data to display  EKG None  Radiology No results found.  Procedures Procedures (including critical care time)  Medications Ordered in ED Medications - No data to display   Initial Impression / Assessment and Plan / ED Course  I have reviewed the triage vital signs and the nursing notes.  Pertinent labs & imaging results that were available during my care  of the patient were reviewed by me and considered in my medical decision making (see chart for details).     Patient to be treated for musculoskeletal strain.  Return precautions given  Final Clinical Impressions(s) / ED Diagnoses   Final diagnoses:  None    ED Discharge Orders    None       Lorre Nick, MD 10/13/18 [id number]

## 2018-10-13 NOTE — ED Notes (Signed)
Pt stated to registration staff that he would also like to be seen "for mental health" but did not elaborate.

## 2018-10-13 NOTE — ED Notes (Signed)
Patient refused vital signs 

## 2018-10-13 NOTE — ED Notes (Signed)
Bed: WTR6 Expected date:  Expected time:  Means of arrival:  Comments: 

## 2020-08-17 ENCOUNTER — Encounter (HOSPITAL_COMMUNITY): Payer: Self-pay

## 2020-08-17 ENCOUNTER — Emergency Department (HOSPITAL_COMMUNITY)
Admission: EM | Admit: 2020-08-17 | Discharge: 2020-08-19 | Disposition: A | Discharge status: Home / Self Care | Payer: Medicaid Other | Attending: Emergency Medicine | Admitting: Emergency Medicine

## 2020-08-17 ENCOUNTER — Other Ambulatory Visit: Payer: Self-pay

## 2020-08-17 DIAGNOSIS — R456 Violent behavior: Secondary | ICD-10-CM | POA: Diagnosis not present

## 2020-08-17 DIAGNOSIS — R259 Unspecified abnormal involuntary movements: Secondary | ICD-10-CM | POA: Diagnosis present

## 2020-08-17 DIAGNOSIS — F200 Paranoid schizophrenia: Secondary | ICD-10-CM | POA: Insufficient documentation

## 2020-08-17 DIAGNOSIS — Z87891 Personal history of nicotine dependence: Secondary | ICD-10-CM | POA: Insufficient documentation

## 2020-08-17 DIAGNOSIS — I10 Essential (primary) hypertension: Secondary | ICD-10-CM | POA: Insufficient documentation

## 2020-08-17 DIAGNOSIS — F250 Schizoaffective disorder, bipolar type: Secondary | ICD-10-CM | POA: Diagnosis present

## 2020-08-17 DIAGNOSIS — J45909 Unspecified asthma, uncomplicated: Secondary | ICD-10-CM | POA: Diagnosis not present

## 2020-08-17 DIAGNOSIS — R4689 Other symptoms and signs involving appearance and behavior: Secondary | ICD-10-CM

## 2020-08-17 DIAGNOSIS — Z20822 Contact with and (suspected) exposure to covid-19: Secondary | ICD-10-CM | POA: Diagnosis not present

## 2020-08-17 LAB — COMPREHENSIVE METABOLIC PANEL
ALT: 30 U/L (ref 0–44)
AST: 27 U/L (ref 15–41)
Albumin: 4.3 g/dL (ref 3.5–5.0)
Alkaline Phosphatase: 69 U/L (ref 38–126)
Anion gap: 9 (ref 5–15)
BUN: 19 mg/dL (ref 6–20)
CO2: 26 mmol/L (ref 22–32)
Calcium: 8.8 mg/dL — ABNORMAL LOW (ref 8.9–10.3)
Chloride: 103 mmol/L (ref 98–111)
Creatinine, Ser: 1.18 mg/dL (ref 0.61–1.24)
GFR, Estimated: >60 mL/min (ref >60)
Glucose, Bld: 94 mg/dL (ref 70–99)
Potassium: 4.8 mmol/L (ref 3.5–5.1)
Sodium: 138 mmol/L (ref 135–145)
Total Bilirubin: 0.6 mg/dL (ref 0.3–1.2)
Total Protein: 7.8 g/dL (ref 6.5–8.1)

## 2020-08-17 LAB — CBC
HCT: 46.9 % (ref 39.0–52.0)
Hemoglobin: 15.4 g/dL (ref 13.0–17.0)
MCH: 30.7 pg (ref 26.0–34.0)
MCHC: 32.8 g/dL (ref 30.0–36.0)
MCV: 93.4 fL (ref 80.0–100.0)
Platelets: 163 10*3/uL (ref 150–400)
RBC: 5.02 MIL/uL (ref 4.22–5.81)
RDW: 14.4 % (ref 11.5–15.5)
WBC: 4.7 10*3/uL (ref 4.0–10.5)
nRBC: 0 % (ref 0.0–0.2)

## 2020-08-17 LAB — ACETAMINOPHEN LEVEL: Acetaminophen (Tylenol), Serum: <10 ug/mL — ABNORMAL LOW (ref 10–30)

## 2020-08-17 LAB — RAPID URINE DRUG SCREEN, HOSP PERFORMED
Amphetamines: NOT DETECTED
Barbiturates: NOT DETECTED
Benzodiazepines: NOT DETECTED
Cocaine: NOT DETECTED
Opiates: NOT DETECTED
Tetrahydrocannabinol: NOT DETECTED

## 2020-08-17 LAB — RESP PANEL BY RT-PCR (FLU A&B, COVID) ARPGX2
Influenza A by PCR: NEGATIVE
Influenza B by PCR: NEGATIVE
SARS Coronavirus 2 by RT PCR: NEGATIVE

## 2020-08-17 LAB — SALICYLATE LEVEL: Salicylate Lvl: 17.3 mg/dL (ref 7.0–30.0)

## 2020-08-17 LAB — ETHANOL: Alcohol, Ethyl (B): <10 mg/dL (ref <10)

## 2020-08-17 MED ORDER — LORAZEPAM 1 MG PO TABS: 1.0000 mg | ORAL_TABLET | ORAL | Status: DC | PRN

## 2020-08-17 MED ORDER — ZOLPIDEM TARTRATE 5 MG PO TABS: 5.0000 mg | ORAL_TABLET | Freq: Every evening | ORAL | Status: DC | PRN

## 2020-08-17 MED ORDER — OLANZAPINE 10 MG PO TBDP: 10.0000 mg | ORAL_TABLET | Freq: Three times a day (TID) | ORAL | Status: DC | PRN

## 2020-08-17 MED ORDER — STERILE WATER FOR INJECTION IJ SOLN
INTRAMUSCULAR | Status: AC
  Administered 2020-08-17: 1 mL
  Filled 2020-08-17: qty 10

## 2020-08-17 MED ORDER — ZIPRASIDONE MESYLATE 20 MG IM SOLR
20.0000 mg | INTRAMUSCULAR | Status: AC | PRN
  Administered 2020-08-17: 20 mg via INTRAMUSCULAR
  Filled 2020-08-17: qty 20

## 2020-08-17 MED ORDER — ACETAMINOPHEN 325 MG PO TABS
650.0000 mg | ORAL_TABLET | ORAL | Status: DC | PRN
  Administered 2020-08-18 (×2): 650 mg via ORAL
  Filled 2020-08-17 (×2): qty 2
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
## 2020-08-17 NOTE — ED Notes (Signed)
Pt came in [name]'d by brother, first exam done by Dr. Rhunette Croft.

## 2020-08-17 NOTE — ED Notes (Signed)
Patient has a urine culture in the main lab 

## 2020-08-17 NOTE — ED Notes (Signed)
Patient has 1 bag in triage cabinet. Contains shoes, shirt, sweater, his keys, phone and wallet that contains money.

## 2020-08-17 NOTE — ED Notes (Signed)
Pt resting at this time. Vitals will be completed once pt is awake and allowing [place] to do them.

## 2020-08-17 NOTE — BH Assessment (Signed)
Contacted nursing and requested TTS machine to be placed in patient's room.
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
## 2020-08-17 NOTE — ED Provider Notes (Signed)
[facility]-EMERGENCY DEPT Provider Note   CSN: [id number] Arrival date & time: 08/17/20  1325     History Chief Complaint  Patient presents with  . [name]  . Homicidal    Ryan York is a 49 y.o. male.  HPI    49 year old male comes in from home, brought in by [employer] with involuntary commitment.  According to the [name] paperwork that was filed by his brother, patient has history of paranoid schizophrenia and he has not been taking his medications.  Patient is hearing voices and has unplugged to live by, sprayed paint everywhere to calm him down and has threatened to kill family members.  Patient denies these allegations.  He reports that his mother is old and is making him things.  He does indicate that he is hearing voices, but that they are not out of control.  He is not taking any medications at this time since the voices are under control.  Past Medical History:  Diagnosis Date  . Asthma   . Bipolar disorder (HCC)   . Hypertension   . Schizophrenia [facility])     Patient Active Problem List   Diagnosis Date Noted  . Asthma 08/05/2016  . Tobacco use disorder 04/04/2016  . Schizoaffective disorder, bipolar type (HCC) 04/20/2012    Past Surgical History:  Procedure Laterality Date  . KNEE SURGERY         Family History  Problem Relation Age of Onset  . Alzheimer's disease Father     Social History   Tobacco Use  . Smoking status: Former Smoker    Packs/day: 0.50    Years: 10.00    Pack years: 5.00    Types: Cigars  . Smokeless tobacco: Never Used  Vaping Use  . Vaping Use: Never used  Substance Use Topics  . Alcohol use: No    Comment: denies alcohol use  . Drug use: No    Comment: UDS not available at time of writing    Home Medications Prior to Admission medications   Medication Sig Start Date End Date Taking? Authorizing Provider  ibuprofen (ADVIL,MOTRIN) 400 MG tablet Take 1 tablet (400 mg total) by mouth every 6 (six)  hours as needed. 10/13/18   Lorre Nick, MD  methocarbamol (ROBAXIN) 500 MG tablet Take 1 tablet (500 mg total) by mouth 2 (two) times daily. 10/13/18   Lorre Nick, MD    Allergies    Hydroxyzine hcl, Lithium, Risperidone and related, and Vistaril [hydroxyzine]  Review of Systems   Review of Systems  Constitutional: Positive for activity change.  Respiratory: Negative for shortness of breath.   Cardiovascular: Negative for chest pain.  Neurological: Negative for headaches.  Psychiatric/Behavioral: Positive for agitation and hallucinations.  All other systems reviewed and are negative.   Physical Exam Updated Vital Signs BP (!) 148/100 (BP Location: Left Arm)   Pulse 74   Temp 97.9 F (36.6 C) (Oral)   Resp 16   Ht 6\' 1"  (1.854 m)   Wt 104.3 kg   SpO2 98%   BMI 30.34 kg/m   Physical Exam Vitals and nursing note reviewed.  Constitutional:      Appearance: He is well-developed.  HENT:     Head: Normocephalic and atraumatic.  Eyes:     Conjunctiva/sclera: Conjunctivae normal.     Pupils: Pupils are equal, round, and reactive to light.  Cardiovascular:     Rate and Rhythm: Normal rate and regular rhythm.  Pulmonary:  Effort: Pulmonary effort is normal.     Breath sounds: Normal breath sounds.  Abdominal:     General: Bowel sounds are normal. There is no distension.     Palpations: Abdomen is soft. There is no mass.     Tenderness: There is no abdominal tenderness. There is no guarding or rebound.  Musculoskeletal:        General: No deformity.     Cervical back: Normal range of motion and neck supple.  Skin:    General: Skin is warm.  Neurological:     Mental Status: He is alert and oriented to person, place, and time.     Cranial Nerves: No cranial nerve deficit.     Sensory: No sensory deficit.  Psychiatric:        Mood and Affect: Mood normal.        Behavior: Behavior normal.     Comments: Abnormal thought content     ED Results / Procedures /  Treatments   Labs (all labs ordered are listed, but only abnormal results are displayed) Labs Reviewed  COMPREHENSIVE METABOLIC PANEL - Abnormal; Notable for the following components:      Result Value   Calcium 8.8 (*)    All other components within normal limits  ACETAMINOPHEN LEVEL - Abnormal; Notable for the following components:   Acetaminophen (Tylenol), Serum <10 (*)    All other components within normal limits  RESP PANEL BY RT-PCR (FLU A&B, COVID) ARPGX2  ETHANOL  SALICYLATE LEVEL  CBC  RAPID URINE DRUG SCREEN, HOSP PERFORMED    EKG EKG Interpretation  Date/Time:  Friday August 17 2020 15:24:32 EST Ventricular Rate:  84 PR Interval:    QRS Duration: 106 QT Interval:  341 QTC Calculation: 403 R Axis:   -57 Text Interpretation: Sinus rhythm LAD, consider left anterior fascicular block RSR' in V1 or V2, right VCD or RVH No acute changes No significant change since last tracing Confirmed by Derwood Kaplan [phone]) on 08/17/2020 3:46:01 PM   Radiology No results found.  Procedures Procedures (including critical care time)  Medications Ordered in ED Medications  acetaminophen (TYLENOL) tablet 650 mg (has no administration in time range)  OLANZapine zydis (ZYPREXA) disintegrating tablet 10 mg (has no administration in time range)    And  LORazepam (ATIVAN) tablet 1 mg (has no administration in time range)    And  ziprasidone (GEODON) injection 20 mg (has no administration in time range)  zolpidem (AMBIEN) tablet 5 mg (has no administration in time range)    ED Course  I have reviewed the triage vital signs and the nursing notes.  Pertinent labs & imaging results that were available during my care of the patient were reviewed by me and considered in my medical decision making (see chart for details).    MDM Rules/Calculators/A&P                          49 year old comes in a chief complaint of abnormal behavior.  He appears to be calm during our  assessment.  He does indicate that he has been hearing voices and not taking medications.  His judgment is a little off based on that history.   Based on the allegations by family, we will carry on with the [name] and call psych.  Patient is medically cleared for psych evaluation.  Final Clinical Impression(s) / ED Diagnoses Final diagnoses:  Aggressive behavior  Paranoid schizophrenia (HCC)    Rx /  DC Orders ED Discharge Orders    None       Derwood Kaplan, MD 08/17/20 1556
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
## 2020-08-18 MED ORDER — DIPHENHYDRAMINE HCL 50 MG/ML IJ SOLN: 50.0000 mg | Freq: Three times a day (TID) | INTRAMUSCULAR | Status: DC | PRN

## 2020-08-18 MED ORDER — LORAZEPAM 2 MG/ML IJ SOLN: 1.0000 mg | Freq: Three times a day (TID) | INTRAMUSCULAR | Status: DC | PRN

## 2020-08-18 MED ORDER — HALOPERIDOL LACTATE 5 MG/ML IJ SOLN: 10.0000 mg | Freq: Three times a day (TID) | INTRAMUSCULAR | Status: DC | PRN

## 2020-08-18 MED ORDER — ZIPRASIDONE MESYLATE 20 MG IM SOLR
20.0000 mg | Freq: Once | INTRAMUSCULAR | Status: AC
  Administered 2020-08-18: 20 mg via INTRAMUSCULAR
  Filled 2020-08-18: qty 20

## 2020-08-18 MED ORDER — ZOLPIDEM TARTRATE 5 MG PO TABS
5.0000 mg | ORAL_TABLET | Freq: Every evening | ORAL | Status: DC | PRN
  Administered 2020-08-18: 5 mg via ORAL
  Filled 2020-08-18: qty 1

## 2020-08-18 NOTE — ED Notes (Signed)
Pt.s dinner tray arrived. Pt awoken from nap, sitting up and eating their dinner. Will continue to monitor pt.
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
## 2020-08-18 NOTE — Consult Note (Signed)
Attempted to see patient via telepsych.  Informed patient is agitated and unable to participate in assessment at this time. Based on chart review, he continues to meet criteria for inpatient admission and SW has been notified.

## 2020-08-18 NOTE — ED Notes (Signed)
Lunch has arrived. Pt awoken from nap, pt sat up and ate burger but went back to sleep after finished with some of his lunch. Will continue to monitor pt.

## 2020-08-18 NOTE — Progress Notes (Signed)
Per Ophelia Shoulder NP, pt continues to meet inpatient criteria. He is in review at [facility]. Bed may not be available today per [facility]. CSW continuing to assess.

## 2020-08-18 NOTE — ED Notes (Signed)
Pt walked to the bathroom. Pt asking when he would be talking to the doctor. Pt reminded that the TTS machine would be talking to him later today due to it being a weekend day. Pt back into their room, Will continue to monitor pt.
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
## 2020-08-18 NOTE — ED Notes (Signed)
Pt walked to the door and begin asking this Clinical research associate for her ethnicity. When asked the pt why it was important, pt stated "to know where your energy comes from". This writer attempted to change subject about pt wearing mask, pt was reluctant to wear mask but still wore it. Asked to go to the bathroom. Walked without problem, pt back in room. Will continue to monitor pt.
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
## 2020-08-18 NOTE — ED Notes (Signed)
Pt asked for a snack. Snack given to pt. Will continue to monitor pt.

## 2020-08-18 NOTE — ED Notes (Signed)
Pt in bed, no s/s of distress. Respirations even and unlabored.

## 2020-08-18 NOTE — ED Notes (Signed)
Pt very anxious and making threats of killing people. MD notified. Security called and at bedside when RN gave meds ordered by MD. Ophelia Charter at bedside also. Respirations even and unlabored.

## 2020-08-18 NOTE — ED Notes (Signed)
Pt walked out of room, attempted to go to the bathroom. Bathroom were full, pt walked back into room. Pt reminded that they are not allowed to walk out in the hallway without anyone with him. Pt verbally stated they understood. Will continue to monitor pt.

## 2020-08-18 NOTE — ED Notes (Signed)
Pt sitting in chair in room asking when he is going home. Respiration even and unlabored.

## 2020-08-18 NOTE — ED Notes (Signed)
Patient is awake, alert, oriented and ambulatory. Provided patient with cheese and crackers per his request.

## 2020-08-18 NOTE — ED Notes (Signed)
Pt.s breakfast tray has arrived, pt sitting up and eating his breakfast. Will continue to monitor pt.

## 2020-08-19 DIAGNOSIS — F250 Schizoaffective disorder, bipolar type: Secondary | ICD-10-CM

## 2020-08-19 MED ORDER — DIVALPROEX SODIUM 500 MG PO DR TAB
500.0000 mg | DELAYED_RELEASE_TABLET | Freq: Two times a day (BID) | ORAL | Status: DC
  Filled 2020-08-19: qty 1

## 2020-08-19 MED ORDER — LORAZEPAM 2 MG/ML IJ SOLN: 1.0000 mg | Freq: Three times a day (TID) | INTRAMUSCULAR | Status: DC | PRN

## 2020-08-19 MED ORDER — HALOPERIDOL LACTATE 5 MG/ML IJ SOLN: 10.0000 mg | Freq: Three times a day (TID) | INTRAMUSCULAR | Status: DC | PRN

## 2020-08-19 MED ORDER — TRAZODONE HCL 100 MG PO TABS: 100.0000 mg | ORAL_TABLET | Freq: Every day | ORAL | Status: DC

## 2020-08-19 MED ORDER — DIPHENHYDRAMINE HCL 50 MG/ML IJ SOLN: 50.0000 mg | Freq: Three times a day (TID) | INTRAMUSCULAR | Status: DC | PRN

## 2020-08-19 NOTE — ED Notes (Addendum)
MD working on D/c and Resending the [name] paperwork. RN attempting to contact someone to come pick up patient. RN had an open discussion that his mother is not comfortable with him returning home at this time. Patient reports he has a car and will go stay in a hotel. Patient attempted to call his Interventional Team and was told to call the crisis line to see if someone could pick him up. This RN called the crisis line for patient to get In touch with someone to pick him up
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
## 2020-08-19 NOTE — ED Notes (Signed)
Patient's brother reports he is on the way to pick him up. States he will be here in about 30 minutes.

## 2020-08-19 NOTE — ED Notes (Signed)
Patient is becoming agitated as he wants to walk around the unit. The main ER is not a secure psych unit and this is not allowed per protocol. Patient is escalating. RN and NT attempting to deescalate patient at this time but patient becoming increasingly agitated at not having freedom of movement and feeling overwhelmed. Patient cycling and becoming louder and more anxious. ED security at bedside

## 2020-08-19 NOTE — ED Notes (Signed)
Lunch tray at bedside. ?

## 2020-08-19 NOTE — ED Notes (Signed)
Patient provided with sprite, patient is alert, calm and cooperative.

## 2020-08-19 NOTE — Progress Notes (Signed)
TOC CM/CSW spoke with Trevor/[facility].  Beryle Beams will begin to seek housing for pt, due to family not accepting him back in the home.  Beryle Beams will also follow up with pt in regards to med. management.   CSW will also provide pt with [facility] shelter resources.    CSW will continue to follow for dc needs.  Celestino Ackerman Tarpley-Carter, MSW, LCSW-A Pronouns:  She, Her, Hers                  [facility] ED Transitions of CareClinical Social Worker [email or web address] [phone]

## 2020-08-19 NOTE — ED Notes (Signed)
Pt is sleeping

## 2020-08-19 NOTE — Progress Notes (Signed)
TOC CM/CSW attempted to contact [facility] to set up follow-up for pt for med. Management.  CSW is currently awaiting a return phone call from [employer] [facility].  CSW will continue to follow for dc needs.  Montrel Donahoe Tarpley-Carter, MSW, LCSW-A Pronouns:  She, Her, Hers                  [facility] ED Transitions of CareClinical Social Worker [email or web address] [phone]

## 2020-08-19 NOTE — ED Notes (Signed)
ED security talked with patient and compromise was met to go back to the secure area to walk around with security present. Patient agreeable to plan of care at this time.

## 2020-08-19 NOTE — ED Notes (Addendum)
Pt has been leaving room and coming up to the nurse's station. Writer told pt he cannot be walking in the hallways and must stay in room. Pt said he understands but just keeps forgetting. Writer also introduced herself and let pt know breakfast would arrive soon.

## 2020-08-19 NOTE — ED Notes (Signed)
TTS at bedside. 

## 2020-12-02 ENCOUNTER — Ambulatory Visit (HOSPITAL_COMMUNITY)
Admission: EM | Admit: 2020-12-02 | Discharge: 2020-12-02 | Disposition: A | Discharge status: Other Acute Inpatient Hospital | Payer: Medicaid Other | Attending: Student | Admitting: Student

## 2020-12-02 ENCOUNTER — Emergency Department (HOSPITAL_COMMUNITY)
Admission: EM | Admit: 2020-12-02 | Discharge: 2020-12-06 | Disposition: A | Discharge status: Home / Self Care | Payer: Medicaid Other | Attending: Emergency Medicine | Admitting: Emergency Medicine

## 2020-12-02 ENCOUNTER — Encounter (HOSPITAL_COMMUNITY): Payer: Self-pay | Admitting: Emergency Medicine

## 2020-12-02 ENCOUNTER — Other Ambulatory Visit: Payer: Self-pay

## 2020-12-02 DIAGNOSIS — Z046 Encounter for general psychiatric examination, requested by authority: Secondary | ICD-10-CM

## 2020-12-02 DIAGNOSIS — R259 Unspecified abnormal involuntary movements: Secondary | ICD-10-CM | POA: Insufficient documentation

## 2020-12-02 DIAGNOSIS — Z9114 Patient's other noncompliance with medication regimen: Secondary | ICD-10-CM | POA: Insufficient documentation

## 2020-12-02 DIAGNOSIS — F250 Schizoaffective disorder, bipolar type: Secondary | ICD-10-CM | POA: Diagnosis present

## 2020-12-02 DIAGNOSIS — I10 Essential (primary) hypertension: Secondary | ICD-10-CM | POA: Insufficient documentation

## 2020-12-02 DIAGNOSIS — Z20822 Contact with and (suspected) exposure to covid-19: Secondary | ICD-10-CM | POA: Diagnosis not present

## 2020-12-02 DIAGNOSIS — Z87891 Personal history of nicotine dependence: Secondary | ICD-10-CM | POA: Diagnosis not present

## 2020-12-02 DIAGNOSIS — J45909 Unspecified asthma, uncomplicated: Secondary | ICD-10-CM | POA: Insufficient documentation

## 2020-12-02 LAB — CBC
HCT: 42.7 % (ref 39.0–52.0)
Hemoglobin: 14.2 g/dL (ref 13.0–17.0)
MCH: 31.1 pg (ref 26.0–34.0)
MCHC: 33.3 g/dL (ref 30.0–36.0)
MCV: 93.4 fL (ref 80.0–100.0)
Platelets: 167 10*3/uL (ref 150–400)
RBC: 4.57 MIL/uL (ref 4.22–5.81)
RDW: 13.7 % (ref 11.5–15.5)
WBC: 4.8 10*3/uL (ref 4.0–10.5)
nRBC: 0 % (ref 0.0–0.2)

## 2020-12-02 LAB — COMPREHENSIVE METABOLIC PANEL
ALT: 22 U/L (ref 0–44)
AST: 25 U/L (ref 15–41)
Albumin: 4 g/dL (ref 3.5–5.0)
Alkaline Phosphatase: 64 U/L (ref 38–126)
Anion gap: 10 (ref 5–15)
BUN: 19 mg/dL (ref 6–20)
CO2: 25 mmol/L (ref 22–32)
Calcium: 9.2 mg/dL (ref 8.9–10.3)
Chloride: 103 mmol/L (ref 98–111)
Creatinine, Ser: 0.95 mg/dL (ref 0.61–1.24)
GFR, Estimated: >60 mL/min (ref >60)
Glucose, Bld: 101 mg/dL — ABNORMAL HIGH (ref 70–99)
Potassium: 4.3 mmol/L (ref 3.5–5.1)
Sodium: 138 mmol/L (ref 135–145)
Total Bilirubin: 0.5 mg/dL (ref 0.3–1.2)
Total Protein: 7.4 g/dL (ref 6.5–8.1)

## 2020-12-02 LAB — RESP PANEL BY RT-PCR (FLU A&B, COVID) ARPGX2
Influenza A by PCR: NEGATIVE
Influenza B by PCR: NEGATIVE
SARS Coronavirus 2 by RT PCR: NEGATIVE

## 2020-12-02 LAB — SALICYLATE LEVEL: Salicylate Lvl: <7 mg/dL — ABNORMAL LOW (ref 7.0–30.0)

## 2020-12-02 LAB — RAPID URINE DRUG SCREEN, HOSP PERFORMED
Amphetamines: NOT DETECTED
Barbiturates: NOT DETECTED
Benzodiazepines: NOT DETECTED
Cocaine: NOT DETECTED
Opiates: NOT DETECTED
Tetrahydrocannabinol: NOT DETECTED

## 2020-12-02 LAB — VALPROIC ACID LEVEL: Valproic Acid Lvl: <10 ug/mL — ABNORMAL LOW (ref 50.0–100.0)

## 2020-12-02 LAB — ACETAMINOPHEN LEVEL: Acetaminophen (Tylenol), Serum: <10 ug/mL — ABNORMAL LOW (ref 10–30)

## 2020-12-02 LAB — ETHANOL: Alcohol, Ethyl (B): <10 mg/dL (ref <10)

## 2020-12-02 MED ORDER — ACETAMINOPHEN 325 MG PO TABS
650.0000 mg | ORAL_TABLET | ORAL | Status: DC | PRN
  Administered 2020-12-02 – 2020-12-03 (×4): 650 mg via ORAL
  Filled 2020-12-02 (×4): qty 2

## 2020-12-02 MED ORDER — STERILE WATER FOR INJECTION IJ SOLN
INTRAMUSCULAR | Status: AC
  Filled 2020-12-02: qty 10

## 2020-12-02 MED ORDER — ZIPRASIDONE MESYLATE 20 MG IM SOLR
20.0000 mg | Freq: Once | INTRAMUSCULAR | Status: AC
  Administered 2020-12-02: 20 mg via INTRAMUSCULAR
  Filled 2020-12-02: qty 20

## 2020-12-02 MED ORDER — METFORMIN HCL ER 500 MG PO TB24
500.0000 mg | ORAL_TABLET | Freq: Two times a day (BID) | ORAL | Status: DC
  Administered 2020-12-06: 500 mg via ORAL
  Filled 2020-12-02 (×13): qty 1

## 2020-12-02 MED ORDER — NICOTINE 21 MG/24HR TD PT24
21.0000 mg | MEDICATED_PATCH | Freq: Every day | TRANSDERMAL | Status: DC
  Filled 2020-12-02 (×3): qty 1

## 2020-12-02 MED ORDER — TRAZODONE HCL 50 MG PO TABS: 100.0000 mg | ORAL_TABLET | Freq: Every day | ORAL | Status: DC

## 2020-12-02 MED ORDER — FLUPHENAZINE HCL 5 MG PO TABS
10.0000 mg | ORAL_TABLET | Freq: Every morning | ORAL | Status: DC
  Administered 2020-12-05 – 2020-12-06 (×2): 10 mg via ORAL
  Filled 2020-12-02: qty 1
  Filled 2020-12-02 (×2): qty 2
  Filled 2020-12-02: qty 1
  Filled 2020-12-02: qty 2
  Filled 2020-12-02 (×2): qty 1
  Filled 2020-12-02: qty 2
  Filled 2020-12-02 (×2): qty 1

## 2020-12-02 MED ORDER — MELATONIN 3 MG PO TABS
3.0000 mg | ORAL_TABLET | Freq: Every day | ORAL | Status: DC
  Filled 2020-12-02 (×3): qty 1

## 2020-12-02 MED ORDER — DIVALPROEX SODIUM 250 MG PO DR TAB
500.0000 mg | DELAYED_RELEASE_TABLET | Freq: Two times a day (BID) | ORAL | Status: DC
  Administered 2020-12-04 – 2020-12-06 (×3): 500 mg via ORAL
  Filled 2020-12-02 (×5): qty 2

## 2020-12-02 MED ORDER — TRAZODONE HCL 100 MG PO TABS
100.0000 mg | ORAL_TABLET | Freq: Every day | ORAL | Status: DC
Start: 2020-12-02 — End: 2020-12-06
  Administered 2020-12-02 – 2020-12-04 (×2): 100 mg via ORAL
  Filled 2020-12-02: qty 1
  Filled 2020-12-02: qty 2
  Filled 2020-12-02 (×2): qty 1

## 2020-12-02 NOTE — BH Assessment (Signed)
Received an update from [facility]) that [facility] will not available bed for him today. Patient remains under review at several alternative hospitals. Nursing Lanora Manis, RN), provided updates.

## 2020-12-02 NOTE — ED Triage Notes (Signed)
Pt transported by [employer] from [facility] d/t reports of aggressive behavior. Pt arrives with [employer], [name] papers in hand, reports by brother of aggressive behavior, not taking medications and danger to himself.  PT rambling, cursing.
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
## 2020-12-02 NOTE — ED Notes (Signed)
Patient belongings in locker 29 

## 2020-12-02 NOTE — ED Notes (Signed)
Lunch Tray Ordered @ 1004. 

## 2020-12-02 NOTE — ED Notes (Signed)
Explained the need for an ekg and pt agreed to have one.

## 2020-12-02 NOTE — ED Notes (Signed)
Dinner Tray Ordered @ 1654. 

## 2020-12-02 NOTE — BH Assessment (Addendum)
Disposition Counselor/LCSW followed up with [facility] Debbe Bales, RN) to request that patient is reviewed for bed placement at [facility]. Also, re-faxed referrals to the multiple facilities for consideration of bed placement.   [facility]      [facility] Details     [facility] Details     [facility] Details     [facility] Details     [facility] Details     [facility] Details     [facility] Details     [facility] Details     [facility] Details     [facility] Details     [facility] Details     [facility] Details     [facility] Details     [facility] Details     [facility] Details     [facility] Details     [facility] Details     [facility] Details     [facility] Details     [facility]

## 2020-12-02 NOTE — ED Provider Notes (Cosign Needed Addendum)
Behavioral Health Urgent Care Medical Screening Exam  Patient Name: Ryan York MRN: [id number] Date of Evaluation: 12/02/20 Chief Complaint:  [name], Hallucinations  Diagnosis:  Final diagnoses:  Schizoaffective disorder, bipolar type (HCC)    Disposition: Based on patient's history, patient's presentation, and my assessment, believe that the patient is a threat to himself and others at this time and I recommend inpatient psychiatric treatment for the patient. Upon communicating my recommendation for inpatient psychiatric treatment to the patient and telling the patient that the patient would need to stay in [facility] continuous observation/assessment while awaiting placement at an appropriate inpatient psychiatric treatment facility, the patient began yelling, cursing, refusing to cooperate with [facility] stuff, stay at the [facility] or go to an inpatient psychiatric treatment facility.  Patient also verbally aggressive towards [facility] staff.  Due to patient exhibiting aggressive behavior at the [facility], patient is not appropriate to stay on the adult [facility] continuous observation/assessment unit at this time while awaiting placement for inpatient psychiatric treatment. Due to patient's aggressive behavior, will transfer the patient to [facility] ED for safety monitoring, while the patient is awaiting placement at an appropriate inpatient psychiatric treatment facility. Provider report given to Dr. Blinda Leatherwood [facility] ED) and Dr. Blinda Leatherwood has agreed to accept the patient. [facility] nursing report given to [facility] ED charge nurse.  EMTALA form completed.  Notified Dr. Blinda Leatherwood that inpatient psychiatric treatment is recommended for the patient at this time.  Also notified Dr. Blinda Leatherwood that a TTS consult should be placed by the EDP that assumes care of the patient in the ED in order for the patient to be followed by psychiatry while the patient is in the ED awaiting placement for an appropriate inpatient psychiatric treatment  facility.  Dr. Charlott Holler understanding and agreement of the overall plan. First exam and recommendation for patient's [name] to be completed by an EDP in the ED within 24 hours of the patient being [name]'d (Patient [name]'d at 10:44 PM on 12/01/20).  History of Present illness: Ryan York is a 50 y.o. male with a with a history of schizoaffective disorder, bipolar type and schizophrenia who presents under [name] to the behavioral health urgent care via law enforcement.  Per [name] paperwork, patient was [name]'d at 10:44 PM on 12/01/20 by his brother (petitioner: Ryan York ([address] [place], [place] [address])). 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 Respondent is suffering from hallucinations and believes aliens are doing horrible things. Respondent has become hostile and threatening to family and is a danger to himself."   Per chart review, patient has been evaluated by [facility] psychiatry service on numerous occasions, with most recent evaluation being on August 19, 2020 while the patient was at [facility] long emergency department under [name] due to patient making homicidal threats, experiencing auditory hallucinations, and not taking his medications at that time.  Chart review also shows that the patient has a history of being verbally and physically aggressive towards others.  Patient was noted to be threatening towards ED staff and requiring medications for agitation while he was in the emergency department at that time in November 2021.   When patient was asked why he was brought to the [facility] by police, he states that he thinks he might be here because "my brother asked me a question and he got sensitive".  When I asked the patient what the question was about, the patient  states "my mom passed away and I told my brother that I feel free since she passed away".  Patient also states that on the evening  of December 01, 2020, he threw a bottle against the wall after becoming agitated.  Patient also states that he is at the [facility] now because he states that the [facility] " tried to put a curse on me and my family, but I put a curse on them".  He states that he shut the [facility] down 40 years ago and that he is currently at the [facility] because [facility] staff need to "apologize to me".  Patient states that [facility] just opened back up again for business and that the same people have been working at the [facility] for the past 40 years.  Patient also states that "there are people in here moving heroin and drugs.  I pray for those people".  Patient denies SI, past suicide attempts, or history of intentionally cutting or burning himself.  Patient denies HI.  Patient endorses AVH.  Patient states that he sees "people using telepathy and cell phones to team up on people".  Patient also states that he hears these people talking to him in a disrespectful way and saying disrespectful things to him.  When patient is asked what these people say to him specifically, patient states "they tried to disrespect my manhood, but that's where the peanut butter comes in".  Patient also states that "I don't mess with people who eat meat " and "black skinned people can't handle the truth". Patient reports that he is sleeping well.  He denies anhedonia, feelings of guilt/hopelessness/worthlessness, or energy changes.  When patient is asked about concentration changes, he denies concentration changes and states that "I am just waiting on my hair to grow".  Patient denies any appetite or weight changes.  Patient denies being easily distracted.  Patient denies engaging in behaviors of indiscretion such as driving under the influence or engaging in excessive shopping sprees.  Patient denies grandiose thoughts, but when asked about thoughts of grandiosity, patient laughs.  Patient endorses flight of ideas.  Patient denies experiencing sleepless nights due to feeling a lack of  need for sleep.  Patient states that he has had schizophrenia "since the day I was born" and he also states that "no medications help for that shit".  Patient also states that he has a history of bipolar disorder and he states that "my bipolar is under control because I don't eat meat". Per chart review, it appears that the patient has a history of taking fluphenazine, trazodone, and Depakote.  Patient states that the only medication he is currently taking is trazodone for sleep and he states that he has not taken any other psychotropic medications for a while.  Patient states that he is not seeing a psychiatrist or therapist now.  He states that he was receiving psychiatric/medication management care and [facility] for the past 5 years, but he states that he discontinued his care with [facility] about 3 weeks ago because "my last mental health team was too redneckish".  He states that he is looking he states that he is looking to find a new psychiatrist and therapist.  When patient is asked about history of inpatient psychiatric treatment, he states that he has received "too much" inpatient psychiatric treatment and he states that he has received inpatient psychiatric treatment "from 1974 to right now everywhere".  per chart review, patient was admitted to the behavioral health  unit at [facility] on August 05, 2016 for hallucinations and schizoaffective disorder, bipolar type and was discharged on August 06, 2016.  Chart review also shows that patient was [facility] to [facility] on April 19, 2012 for schizoaffective disorder bipolar type and was discharged on April 27, 2012. Patient denies any alcohol, tobacco, vape, or additional drug/substance use.  Patient states that he [place] in [place] with his brother.  He denies any access to guns or weapons.  Patient states that he is unemployed and disabled.  He denies having any sources of social  support.  On exam, patient is sitting in a chair appearing agitated. He is verbally combative at times. He appears to be paranoid and delusional.  His thought content appears to be tangential.  He repeatedly states "I'm ready to get out of here".  When patient is asked about his mood, patient does not provide an answer regarding his mood.  Patient's affect appears inappropriate, agitated, and labile.  Patient is alert and oriented x4, but is uncooperative at times throughout the assessment when asked certain questions.  Patient glances at the walls of the exam room during the assessment at times and appears to be responding to external stimuli.  Psychiatric Specialty Exam  Presentation  General Appearance:Bizarre; Disheveled  Eye Contact:Fleeting  Speech:Clear and Coherent; Normal Rate (Tangential)  Speech Volume:-- (Increased at times when patient becomes verbally aggressive)  Handedness:No data recorded  Mood and Affect  Mood:-- (Patient does not provide answer when asked about mood)  Affect:Inappropriate; Labile (Agitated)   Thought Process  Thought Processes:Disorganized  Descriptions of Associations:Tangential  Orientation:Full (Time, Place and Person)  Thought Content:Paranoid Ideation; Scattered; Tangential; Delusions  Hallucinations:Visual; Auditory  Ideas of Reference:Paranoia; Delusions  Suicidal Thoughts:No  Homicidal Thoughts:No   Sensorium  Memory:Immediate Fair; Recent Fair; Remote Fair  Judgment:Poor  Insight:Lacking   Executive Functions  Concentration:Fair  Attention Span:Fair  Recall:Fair  Fund of Knowledge:Fair  Language:Fair   Psychomotor Activity  Psychomotor Activity:Normal   Assets  Assets:Communication Skills; Financial Resources/Insurance; Housing; Leisure Time; Physical Health   Sleep  Sleep:Good  Number of hours: No data recorded   Physical Exam: Physical Exam Vitals reviewed.  Constitutional:      General: He is  not in acute distress.    Appearance: He is not ill-appearing, toxic-appearing or diaphoretic.  HENT:     Head: Normocephalic and atraumatic.     Right Ear: External ear normal.     Left Ear: External ear normal.  Cardiovascular:     Rate and Rhythm: Normal rate.  Pulmonary:     Effort: Pulmonary effort is normal. No respiratory distress.  Musculoskeletal:        General: Normal range of motion.     Cervical back: Normal range of motion.  Neurological:     Mental Status: He is alert and oriented to person, place, and time.  Psychiatric:        Attention and Perception: He perceives auditory and visual hallucinations.        Speech: Speech is tangential.        Behavior: Behavior is agitated and combative. Behavior is not slowed or withdrawn.        Thought Content: Thought content is paranoid and delusional. Thought content does not include homicidal or suicidal ideation.     Comments: When patient is asked about his mood, patient does not provide an answer regarding his mood.  Patient's affect appears inappropriate, agitated and labile. He is verbally combative at  times. Judgement poor and insight lacking. Patient endorses AVH.  Patient states that he sees "people using telepathy and cell phones to team up on people".  Patient also states that he hears these people talking to him in a disrespectful way and saying disrespectful things to him.  When patient is asked what these people say to him specifically, patient states "they tried to disrespect my manhood, but that's where the peanut butter comes in".     Review of Systems  Constitutional: Negative for chills, diaphoresis, fever, malaise/fatigue and weight loss.  HENT: Negative for congestion.   Respiratory: Negative for cough and shortness of breath.   Cardiovascular: Negative for chest pain and palpitations.  Gastrointestinal: Negative for abdominal pain, constipation, diarrhea, nausea and vomiting.  Musculoskeletal: Negative for  joint pain and myalgias.  Neurological: Negative for dizziness and headaches.  Psychiatric/Behavioral: Positive for hallucinations. Negative for depression, memory loss, substance abuse and suicidal ideas. The patient does not have insomnia.   All other systems reviewed and are negative.    Vitals: Blood pressure (!) 154/112, pulse 80, temperature 97.7 F (36.5 C), temperature source Oral, resp. rate 19, SpO2 100 %. There is no height or weight on file to calculate BMI.  Musculoskeletal: Strength & Muscle Tone: within normal limits Gait & Station: normal Patient leans: N/A   [facility] MSE Discharge Disposition for Follow up and Recommendations: Based on my evaluation I certify that psychiatric inpatient services furnished can reasonably be expected to improve the patient's condition which I recommend transfer to an appropriate accepting facility.   Patient is a 50 year old male with a history of schizoaffective disorder, bipolar type who presents to the behavioral health urgent care under [name].  Patient states that he has not taken majority of his psychotropic medications in a while.  Patient is endorsing AVH on exam.  Patient appears to be paranoid, delusional, and psychotic at this time.  Patient's schizoaffective disorder appears to be rapidly deteriorating at this time. Based on patient's history, patient's presentation, and my assessment, believe that the patient is a threat to himself and others at this time and I recommend inpatient psychiatric treatment for the patient. Upon communicating my recommendation for inpatient psychiatric treatment to the patient and telling the patient that the patient would need to stay in [facility] continuous observation/assessment while awaiting placement at an appropriate inpatient psychiatric treatment facility, the patient began yelling, cursing, refusing to cooperate with [facility] stuff, stay at the [facility] or go to an inpatient psychiatric treatment facility.  Patient  also verbally aggressive towards [facility] staff.  Due to patient exhibiting aggressive behavior at the [facility], patient is not appropriate to stay on the adult [facility] continuous observation/assessment unit at this time while awaiting placement for inpatient psychiatric treatment. Due to patient's aggressive behavior, will transfer the patient to [facility] ED for safety monitoring, while the patient is awaiting placement at an appropriate inpatient psychiatric treatment facility. Provider report given to Dr. Blinda Leatherwood [facility] ED) and Dr. Blinda Leatherwood has agreed to accept the patient. [facility] nursing report given to [facility] ED charge nurse.  EMTALA form completed.  Notified Dr. Blinda Leatherwood that inpatient psychiatric treatment is recommended for the patient at this time.  Also notified Dr. Blinda Leatherwood that a TTS consult should be placed by the EDP that assumes care of the patient in the ED in order for the patient to be followed by psychiatry while the patient is in the ED awaiting placement for an appropriate inpatient psychiatric treatment facility.  Dr.  Pollina verbalizes understanding and agreement of the overall plan. First exam and recommendation for patient's [name] to be completed by an EDP in the ED within 24 hours of the patient being [name]'d (Patient [name]'d at 10:44 PM on 12/01/20). Dr. Blinda Leatherwood states that the first exam and recommendation will be completed in the ED.    Jaclyn Shaggy, PA-C 12/02/2020, 2:58 AM

## 2020-12-02 NOTE — ED Notes (Signed)
As per PA Melbourne Abts, pt accepted to [facility] as per Dr Blinda Leatherwood.  Pt is [name], pending [employer] transfer.

## 2020-12-02 NOTE — ED Notes (Signed)
Lab called to add-on valproic acid level

## 2020-12-02 NOTE — ED Triage Notes (Signed)
Presents to [facility] escorted by [employer], pt is [name] by brother, noncompliant with meds, hostile and threatening towards family.  Believes aliens are doing horrible things.

## 2020-12-02 NOTE — ED Notes (Signed)
Report to [facility] Charge Nurse Shanda Bumps, transfer via [employer].

## 2020-12-02 NOTE — BH Assessment (Addendum)
Pt continues to meet inpt criteria. Disposition Counselor/LCSW followed up with [facility] Debbe Bales, RN) to request that patient is reviewed for bed placement at [facility]. Awaiting a response from [facility] regarding bed availability. Patient continues to remain under review at several alternative facilities as noted below:  [facility]   [facility] Details  [facility] Details  [facility] Details  [facility] Details  [facility] Details  [facility] Details  [name] Details  [facility] Details  [facility] Details  [facility] Details  [facility] Details  [facility] Details  [facility] Details  [facility] Details  [facility] Details  [facility] Details  [facility] Details  [facility] Details  [facility] Details  [facility]

## 2020-12-02 NOTE — BH Assessment (Signed)
Melbourne Abts, PA-C recommends inpatient psychiatric treatment. Binnie Rail, [facility] at [facility], says an appropriate bed is not currently available. Other facilities will be contacted for placement.   Pamalee Leyden, [facility], [facility] Triage Specialist [phone]

## 2020-12-02 NOTE — BH Assessment (Addendum)
Comprehensive Clinical Assessment (CCA) Note  12/02/2020 Ryan York [id number]  Chief Complaint:  Chief Complaint  Patient presents with  . [name]    Aggressive Behavior   Visit Diagnosis:  F60.1 Schizoid personality disorder    Disposition: Margorie John PA-C, recommends psychiatric evaluation, was transferred to [facility] due to agitation.Ryan York is 50 years old male patient who presents involuntarily to [facility] via Event organiser and unaccompanied.  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  Respondent is suffering from hallucinations and believe aliens are doing horrible things.  Respondent has become hostile and threatening to family and is danger to himself".  Pt reports a history of schizophrenia  "since the day I was born'. Pt states that his bipolar is under control  "because, I don't eat meat'  Pt reports that "I am a celebrity".  Pt reports that he sleeps well, usually during the day.  Pt reports his appetite is fine.  Pt denies manic symptoms.  Pt denies suicide ideation and previous suicide attempt.  Pt denies intentional self injurious behaviors.  Pt denies homicidal ideation.  Pt admitted to experiencing racing thoughts.  Pt denies paranoia, also exhibit agitation to personnel "you are asking me the same questions".  Pt denies drinking alcohol and any other substance use.  Pt did not identified any primary stressor.  Pt reports that he lives with his brother and receives a disability check.  Pt denies any history of mental illness and substance use.  Pt denies any history of abuse or trauma.  Pt reports that he has a pending court date in April.  Pt says his is not currently receiving weekly outpatient therapy however he met with a therapist three weeks ago, unable to report therapist name.  Pt reports that he is currently prescribed by a psychiatrist trazodone medication and is not currently  taking his medication.  Pt reports one previous inpatient psychiatric hospitalization in November 2017 at [facility].  Pt is dressed disshelved, alert, oriented x 2 with garble speech and restless motor behavior .  Eye contact is fleeting.  Pt mood is irritable and affect is anxious.  Thought process is illusions.  Pt  insight is flashes of insight and judgement is poor.  There is indication Pt is currently responding to internal stimuli or experiencing delusional thought content.  Pt was irritable throughout assessment.  The patient demonstrates the following risk factors for suicide: Chronic risk factors for suicide include: psychiatric schizoaffective  disorder Acute risk factors for suicide include: family or marital conflict. Protective factors for this patient include: coping skills. Considering these factors, the overall suicide risk at this point appears to be.  Patient appropriate for outpatient follow up.  [facility] from 12/02/2020 in [place] [facility] from 05/18/2018 in [place] [facility] ED from 05/10/2018 in [place] [facility]  C-SSRS RISK CATEGORY No Risk Moderate Risk No Risk               Disposition: Margorie John PA-C, recommends psychiatric evaluation, had to be transferred to [facility] due to agitation.Marland York    CCA Screening, Triage and Referral (STR)  Patient Reported Information How did you hear about [place]? No data recorded Referral name: No data recorded Referral phone number: No data recorded  Whom do you see for routine medical problems? -- (Pt unable to recall provider)  Practice/Facility Name: No data recorded Practice/Facility Phone Number: No data recorded Name of Contact: No data recorded Contact Number: No data recorded Contact Fax Number: No data recorded Prescriber Name: No data recorded Prescriber Address (if known): No data recorded  What Is the Reason for Your  Visit/Call Today? No data recorded How Long Has This Been Causing You Problems? 1 wk - 1 month  What Do You Feel Would Help You the Most Today? -- (UTA)   Have You Recently Been in Any Inpatient Treatment (Hospital/Detox/Crisis Center/28-Day Program)? No  Name/Location of Program/Hospital:No data recorded How Long Were You There? No data recorded When Were You Discharged? No data recorded  Have You Ever Received Services From [facility] Before? Yes  Who Do You See at [facility]? 08/17/20   Have You Recently Had Any Thoughts About Hurting Yourself? Yes  Are You Planning to Commit Suicide/Harm Yourself At This time? No   Have you Recently Had Thoughts About [place]? No  Explanation: No data recorded  Have You Used Any Alcohol or Drugs in the Past 24 Hours? No  How Long Ago Did You Use Drugs or Alcohol? No data recorded What Did You Use and How Much? No data recorded  Do You Currently Have a Therapist/Psychiatrist? No  Name of Therapist/Psychiatrist: No data recorded  Have You Been Recently Discharged From Any Office Practice or Programs? No  Explanation of Discharge From Practice/Program: No data recorded    CCA Screening Triage Referral Assessment Type of Contact: Face-to-Face  Is this Initial or Reassessment? No data recorded Date Telepsych consult ordered in [facility]:  No data recorded Time Telepsych consult ordered in [facility]:  No data recorded  Patient Reported Information Reviewed? Yes  Patient Left Without Being Seen? No data recorded Reason for Not Completing Assessment: No data recorded  Collateral Involvement: No collateral presents   Does Patient Have a [place]? No data recorded Name and Contact of Legal Guardian: -- (patient is his own legal guardian )  If Minor and Not Living with Parent(s), Who has Custody? UTA  Is CPS involved or ever been involved? Never  Is APS involved or ever been involved? --  (UTA)   Patient Determined To Be At Risk for Harm To Self or Others Based on Review of Patient Reported Information or Presenting Complaint? Yes, for Self-Harm  Method: No Plan  Availability of Means: No access or NA  Intent: Vague intent or NA  Notification Required: No need or identified person  Additional Information for Danger to Others Potential: Active psychosis  Additional Comments for Danger to Others Potential: Law Enforcement have been called out  Are There Guns or Other Weapons in Your Home? No  Types of Guns/Weapons: No data recorded Are These Weapons Safely Secured?                            No data recorded Who Could Verify You Are Able To Have These Secured: No data recorded Do You Have any Outstanding Charges, Pending Court Dates, Parole/Probation? Pt reports pending court date in April.  Contacted To Inform of Risk of Harm To Self or Others: Product/process development scientist of Assessment: [facility] Assessment Services   Does Patient Present under Involuntary Commitment? Yes  [name] Papers Initial File Date: 12/01/2020   [place] of Residence: [place]   Patient Currently Receiving the Following Services: Not Receiving Services   Determination of Need: No  data recorded  Options For Referral: Inpatient Hospitalization     CCA Biopsychosocial Intake/Chief Complaint:  Psychotic  Current Symptoms/Problems: auditory hallucinations   Patient Reported Schizophrenia/Schizoaffective Diagnosis in Past: Yes   Strengths: UTA  Preferences: UTA  Abilities: UTA   Type of Services Patient Feels are Needed: UTA   Initial Clinical Notes/Concerns: SI   Mental Health Symptoms Depression:  None   Duration of Depressive symptoms: No data recorded  Mania:  Racing thoughts; Recklessness; Increased Energy; Irritability; Change in energy/activity   Anxiety:   No data recorded  Psychosis:  Grossly disorganized or catatonic behavior; Hallucinations   Duration of  Psychotic symptoms: Less than six months   Trauma:  None   Obsessions:  None   Compulsions:  None   Inattention:  None   Hyperactivity/Impulsivity:  Difficulty waiting turn; Feeling of restlessness; Fidgets with hands/feet   Oppositional/Defiant Behaviors:  None   Emotional Irregularity:  None   Other Mood/Personality Symptoms:  No data recorded   Mental Status Exam Appearance and self-care  Stature:  Tall   Weight:  Average weight   Clothing:  Disheveled   Grooming:  Neglected   Cosmetic use:  None   Posture/gait:  Normal   Motor activity:  Agitated   Sensorium  Attention:  Confused   Concentration:  Focuses on irrelevancies; Scattered   Orientation:  Object; Person   Recall/memory:  Defective in Recent; Defective in Short-term; Defective in Remote   Affect and Mood  Affect:  Anxious   Mood:  Irritable   Relating  Eye contact:  Fleeting   Facial expression:  Tense   Attitude toward examiner:  Irritable   Thought and Language  Speech flow: Flight of Ideas   Thought content:  Illusions   Preoccupation:  Religion   Hallucinations:  Auditory   Organization:  No data recorded  [employer] of Knowledge:  No data recorded  Intelligence:  Needs investigation   Abstraction:  Abstract   Judgement:  Poor   Reality Testing:  Distorted   Insight:  Flashes of insight   Decision Making:  Confused   Social Functioning  Social Maturity:  Irresponsible   Social Judgement:  Impropriety   Stress  Stressors:  Family conflict; Legal   Coping Ability:  Deficient supports   Skill Deficits:  Self-control; Activities of daily living; Communication; Decision making; Intellect/education; Interpersonal; Responsibility; Self-care   Supports:  Support needed     Religion: Religion/Spirituality Are You A Religious Person?:  (UTA) How Might This Affect Treatment?: UTA  Leisure/Recreation: Leisure / Recreation Do You Have Hobbies?:   (UTA)  Exercise/Diet: Exercise/Diet Do You Exercise?:  (UTA) Have You Gained or Lost A Significant Amount of Weight in the Past Six Months?: No Do You Follow a Special Diet?: No Do You Have Any Trouble Sleeping?:  (UTA)   CCA Employment/Education Employment/Work Situation: Employment / Work Situation Employment situation: On disability Why is patient on disability: UTA How long has patient been on disability: UTA Patient's job has been impacted by current illness: No What is the longest time patient has a held a job?: UTA Where was the patient employed at that time?: UTA  Education: Education Is Patient Currently Attending School?: No Last Grade Completed: 12 (UTA) Name of High School: [employer] Did Teacher, adult education From [employer]?: Yes Did Physicist, medical?: No Did You Attend Graduate School?: No Did You Have Any Special Interests In School?: UTA Did You Have An Individualized Education Program (IIEP):  (  UTA) Did You Have Any Difficulty At School?:  (UTA) Patient's Education Has Been Impacted by Current Illness:  (UTA)   CCA Family/Childhood History Family and Relationship History: Family history Are you sexually active?:  (UTA) What is your sexual orientation?: UTA Has your sexual activity been affected by drugs, alcohol, medication, or emotional stress?: UTA Does patient have children?:  (UTA)  Childhood History:  Childhood History By whom was/is the patient raised?: Mother Additional childhood history information: Pt reports that his mother died, "I am glade she is dead, she kept me jail" Description of patient's relationship with caregiver when they were a child: negative Patient's description of current relationship with people who raised him/her: distance How were you disciplined when you got in trouble as a child/adolescent?: UTA Does patient have siblings?: Yes Number of Siblings: 1 Description of patient's current relationship with siblings:  UTA Did patient suffer any verbal/emotional/physical/sexual abuse as a child?:  (UTA) Did patient suffer from severe childhood neglect?:  (UTA) Was the patient ever a victim of a crime or a disaster?:  (UTA) Witnessed domestic violence?:  (UTA) Has patient been affected by domestic violence as an adult?:  Special educational needs teacher)  Child/Adolescent Assessment:     CCA Substance Use Alcohol/Drug Use: Alcohol / Drug Use Pain Medications: See MRA Prescriptions: See MRA Over the Counter: See MRA History of alcohol / drug use?: No history of alcohol / drug abuse (Pt reports no history of substance use.)                         ASAM's:  Six Dimensions of Multidimensional Assessment  Dimension 1:  Acute Intoxication and/or Withdrawal Potential:      Dimension 2:  Biomedical Conditions and Complications:      Dimension 3:  Emotional, Behavioral, or Cognitive Conditions and Complications:     Dimension 4:  Readiness to Change:     Dimension 5:  Relapse, Continued use, or Continued Problem Potential:     Dimension 6:  Recovery/Living Environment:     ASAM Severity Score:    ASAM Recommended Level of Treatment:     Substance use Disorder (SUD)    Recommendations for Services/Supports/Treatments:    DSM5 Diagnoses: Patient Active Problem List   Diagnosis Date Noted  . Asthma 08/05/2016  . Tobacco use disorder 04/04/2016  . Schizoaffective disorder, bipolar type ([place]) 04/20/2012     Referrals to Alternative Service(s): Referred to Alternative Service(s):   Place:   Date:   Time:    Referred to Alternative Service(s):   Place:   Date:   Time:    Referred to Alternative Service(s):   Place:   Date:   Time:    Referred to Alternative Service(s):   Place:   Date:   Time:     Leonides Schanz, Counselor

## 2020-12-02 NOTE — ED Provider Notes (Signed)
Emergency Medicine Observation Re-evaluation Note  Ryan York is a 50 y.o. male, seen on rounds today.  Pt initially presented to the ED for complaints of [name] Currently, the patient is agitated, screaming, cursing, hostile towards staff.  Physical Exam  BP (!) 165/97 (BP Location: Right Arm)   Pulse 64   Temp 97.8 F (36.6 C) (Oral)   Resp 17   SpO2 100%  Physical Exam General: Agitated Lungs: Speaks in full sentences without difficulty Psych: Agitated, screaming, hostile towards staff  ED Course / MDM  EKG:    I have reviewed the labs performed to date as well as medications administered while in observation.  Recent changes in the last 24 hours include increased agitation requiring chemical sedation.  Plan  Current plan is for inpatient  Patient combative, agitated, screaming and cursing.  Requiring chemical sedation, IM Geodon ordered Patient is under full [name] at this time.   Ryan York A, PA-C 12/02/20 1404    Ryan Laine, MD 12/02/20 [phone]

## 2020-12-02 NOTE — ED Notes (Addendum)
Pt has escalated to a point of being dangerous to him self and others. He is verbally threatening staff and using in appropriate language. Radioed for security. Notified EDP of situation.

## 2020-12-02 NOTE — Discharge Instructions (Addendum)
Patient to be transferred to [facility] ED due to patient exhibiting aggressive behavior.

## 2020-12-02 NOTE — BHH Counselor (Signed)
Pt remains at ED under [name] due to hallucination and paranoia.  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  Respondent is suffering from hallucinations and believe aliens are doing horrible things.  Respondent has become hostile and threatening to family and is danger to himself".  Pt reports a history of schizophrenia  "since the day I was born'. Pt states that his bipolar is under control  "because, I don't eat meat'  Pt reports that "I am a celebrity".  Pt reports that he sleeps well, usually during the day.  Pt reports his appetite is fine.  Pt denies manic symptoms.  Pt denies suicide ideation and previous suicide attempt.  Pt denies intentional self injurious behaviors.  Pt denies homicidal ideation.  Pt admitted to experiencing racing thoughts.  Pt denies paranoia, also exhibit agitation to personnel "you are asking me the same questions".  Pt denies drinking alcohol and any other substance use."  Pt was reassessed this AM.  He was sitting upright, state that he came to the hospital because he got into an argument with his brother (with whom he lives).  Asked about hallucination, Pt stated, ''I don't hallucinate, I have telepathy.''  Pt denied suicidal ideation, homicidal ideation, and self-injurious behavior.  Recommend inpatient due to ongoing hallucination and delusion.

## 2020-12-02 NOTE — ED Notes (Signed)
Changed diet order and called for new lunch tray. Updated patient.

## 2020-12-02 NOTE — ED Notes (Signed)
Patient denies pain and is resting comfortably.  

## 2020-12-02 NOTE — ED Notes (Signed)
Pt threw lunch tray and food out side of his room. Pt continued to verbally abuse staff including security and LEO.

## 2020-12-03 MED ORDER — ZIPRASIDONE MESYLATE 20 MG IM SOLR
20.0000 mg | Freq: Two times a day (BID) | INTRAMUSCULAR | Status: DC | PRN
  Administered 2020-12-04 – 2020-12-05 (×2): 20 mg via INTRAMUSCULAR
  Filled 2020-12-03 (×3): qty 20

## 2020-12-03 MED ORDER — LORAZEPAM 1 MG PO TABS
1.0000 mg | ORAL_TABLET | Freq: Four times a day (QID) | ORAL | Status: DC | PRN
  Administered 2020-12-04: 1 mg via ORAL
  Filled 2020-12-03: qty 1

## 2020-12-03 MED ORDER — STERILE WATER FOR INJECTION IJ SOLN
INTRAMUSCULAR | Status: AC
  Filled 2020-12-03: qty 10

## 2020-12-03 NOTE — ED Notes (Signed)
Patient was given [place]  Peanut Butter and Ginger Ale.

## 2020-12-03 NOTE — ED Notes (Signed)
Pt refusing to take any medications other than trazodone, states he doesn't take anything and doesn't needs anything   Pt continues to pace in room and come to door to take to staff.  Staff able to redirect pt at this time  Calm and cooperative

## 2020-12-03 NOTE — ED Notes (Signed)
Dinner Tray Ordered @ 1700. 

## 2020-12-03 NOTE — ED Notes (Addendum)
Pt agitated standing in door way shouting at staff.  Pt states "you cant keep my here, I will pee on the floor and throw my lunch tray on the ground if you try to keep me here" States he is seeing hundreds of glasses watching him and hears voices in his head  Request tylenol, refusing PRN anxiety/physch medications. States head and blood stream hurt from all the radiations and poison on medications

## 2020-12-03 NOTE — ED Notes (Signed)
Patient states "I'm a grown man and I take meds when I get ready!" Patient states he does not want to take medication because he is not ready to sleep;-Monique,RN

## 2020-12-03 NOTE — BH Assessment (Signed)
Disposition: Pt has not been accepted to [facility] or other facility as of yet. No appropriate beds at [facility], CSW to follow up on 3/8, patient has not yet surpassed 24 hours. Received notice from Medical Director of [facility] (Dr. Nelly Rout), that patient is ok to be faxed out due to no appropriate beds at [facility] for today. Disposition Counselor re-faxed out to multiple facilities and under review for bed placement

## 2020-12-03 NOTE — ED Notes (Signed)
Lunch Tray Ordered @ 1023. 

## 2020-12-03 NOTE — ED Notes (Signed)
Patient back in room at this time. Currently calm and cooperative.

## 2020-12-03 NOTE — ED Provider Notes (Signed)
Emergency Medicine Observation Re-evaluation Note  Ryan York is a 50 y.o. male, seen on rounds today.  Pt initially presented to the ED for complaints of [name] Currently, the patient is waiting for inpatient treatment.  Patient states he is ready to get out of here.  No complaints this morning.  Physical Exam  BP 123/81 (BP Location: Right Arm)   Pulse 69   Temp 97.8 F (36.6 C) (Oral)   Resp 18   SpO2 94%  Physical Exam General: Alert, awake, walking around in his room Cardiac: Regular rate Lungs: Breathing easily no stridor Psych: Calm mood, cooperative, not agitated or angry  ED Course / MDM  EKG:    I have reviewed the labs performed to date as well as medications administered while in observation.  Recent changes in the last 24 hours include patient not wanting to take all of his medications.  Has been taking his trazodone..  Plan  Current plan is for inpatient psychiatric treatment. Patient is under full [name] at this time.   Linwood Dibbles, MD 12/03/20 [phone]

## 2020-12-03 NOTE — ED Notes (Signed)
Patient in restroom washing up independently at this time. Patient declined offer to use shower, reports he'd rather use sink.  Provided with clean scrubs.

## 2020-12-03 NOTE — ED Provider Notes (Signed)
I was asked by nursing to complete first examination paperwork.  I agree with of holding the petition and have filled out the form.   Mancel Bale, MD 12/03/20 [phone]
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
## 2020-12-04 NOTE — ED Provider Notes (Addendum)
50 year old male under an [name].  Current plan is for inpatient psychiatric treatment.  He has been refusing his medications that psychiatry has ordered.  Continues to be agitated.  I feel the patient needs these medications to help stabilize his psychiatric condition.  I recommend that the patient be forced to take the medications.  Per psychiatry NP Gerilyn Pilgrim he will need a second physician to evaluate and agree with forced medication.   Terrilee Files, MD 12/04/20 Carlis Stable    Terrilee Files, MD 12/04/20 [phone]  Psychiatry is recommending Prolixin 5 mg IM if patient refusing oral Prolixin.    Terrilee Files, MD 12/04/20 2016

## 2020-12-04 NOTE — Progress Notes (Signed)
Patient information has been sent to [facility] via secure chat to review for potential admission. Patient meets inpatient criteria per Melbourne Abts, PA.   Situation ongoing, CSW will continue to monitor progress.    Signed:  Corky Crafts, MSW, [place], LCASA 12/04/2020 10:34 AM

## 2020-12-04 NOTE — ED Notes (Signed)
Lunch Tray Ordered @ 1015. 

## 2020-12-04 NOTE — ED Notes (Signed)
Meds have been wasted with Charge, Charity fundraiser.

## 2020-12-04 NOTE — ED Notes (Signed)
This RN scanned medications and  in a medicine cup for pt to take. This RN went over the medications with pt and pt refused to take meds. Pt said that the only medication he would take was Depakote. Pt took Depakote.
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
## 2020-12-04 NOTE — ED Notes (Signed)
Tele psych machine to bedside for pt assessment

## 2020-12-04 NOTE — ED Notes (Signed)
Pt keeps walking out of room being rude towards staff, yelling at other patients.

## 2020-12-04 NOTE — ED Notes (Signed)
Pt awake, refusing all morning medications, speech pressured, loud , cussing; Sitter present. Will continue monitor.

## 2020-12-04 NOTE — ED Provider Notes (Signed)
  Patient refusing to take medications.  Continues to be intermittently agitated with threats of violence.  Made contact with Marciano Sequin, psych NP. She states we will need additional physician support and input to force patient to take his medications.  Dr. Charm Barges will continue to manage this at the end of my shift.       Concepcion Living 12/04/20 2005    Terrilee Files, MD 12/05/20 [phone]

## 2020-12-04 NOTE — ED Provider Notes (Signed)
Emergency Medicine Observation Re-evaluation Note  Ryan York is a 50 y.o. male, seen on rounds today.  Pt initially presented to the ED for complaints of [name] Currently, the patient is resting in bed.  Physical Exam  BP 122/71 (BP Location: Left Arm)   Pulse 68   Temp 98 F (36.7 C)   Resp 16   SpO2 93%  Physical Exam General: No acute distress Cardiac: Regular rhythm Lungs: No increased work of breathing Psych: Calm and cooperative currently  ED Course / MDM  EKG:    I have reviewed the labs performed to date as well as medications administered while in observation.  Recent changes in the last 24 hours include intermittent irritability.  Plan  Current plan is for inpatient psychiatric treatment.  Per [name] counselor, Ryan York, note on December 03, 2020, no appropriate beds are available at [name].  Patient is under full [name] at this time.   Plan for reassessment by psych NP today.  3:37 PM psych NP reevaluated patient: "Disposition: Continue to recommend inpatient treatment and Prolixin, Depakote, PRN medications for agitation. CSW updated for bed placement. ED staff updated."     Ryan Pancoast, PA-C 12/04/20 1537    Ryan Barrette, MD 12/04/20 [phone]

## 2020-12-05 MED ORDER — LORAZEPAM 2 MG/ML IJ SOLN: 2.0000 mg | Freq: Four times a day (QID) | INTRAMUSCULAR | Status: DC | PRN

## 2020-12-05 NOTE — ED Provider Notes (Signed)
Emergency Medicine Observation Re-evaluation Note  GEROGE GILLIAM is a 50 y.o. male, seen on rounds today.  Pt initially presented to the ED for complaints of [name] Currently, the patient is sleeping, awakens easily.  Physical Exam  BP 129/88 (BP Location: Right Arm)   Pulse 71   Temp 97.6 F (36.4 C) (Axillary)   Resp 18   SpO2 98%  Physical Exam General: In no distress Cardiac: No tachycardia Lungs: No increased work of breathing Psych: Diminished agitation compared to yesterday, now with increased medication compliance as well.  ED Course / MDM  EKG:    I have reviewed the labs performed to date as well as medications administered while in observation.  Recent changes in the last 24 hours include no notable changes.  Plan  Current plan is for assistance with our behavioral health colleagues to facilitate medication compliance for agitation.. Patient is under full [name] at this time.   Gerhard Munch, MD 12/05/20 1026
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
## 2020-12-05 NOTE — Progress Notes (Signed)
Pt. Continues to meet criteria for inpatient treatment per Marciano Sequin, NP. Referred out to the following hospitals:   Destination  Service Provider Address Phone [facility]  [address] [place] [address] [phone]  [facility]  [address] [place], [place] [address] [phone]  [facility]  [address] [place] [address] [phone]  [facility]  [address] [place], [place] [address] [phone]  [facility]  [address], [place] [address] [phone]  [facility]  [address] [place], [place] [address] [phone]  [facility]  [address] [place] [address] [phone]  [facility]  [address], [place] [address] [phone]  [facility]  [address] [place] [address] [phone]  [facility]  [address] [place] [address] [phone]  [facility]  [address] [place] [address] [phone]  [facility]  [address] [place] [address] [phone]  [facility]  [address] [place] [address] [phone]  [facility]  [address] [place] [address] [phone]  [facility]  [address] [place], [place] [address] [phone]  [facility]  [address] [place] [address] [phone]  [facility]  [address], [place] [address] [phone]  [facility]  [address] [place] [address] [phone]  [facility]  [address] [place] [address] [phone]  [facility]  [address] [place] [address] [phone]  [facility]  [address] [place] [address] [phone]      Disposition CSW will continue to follow for placement.  Signed:  Corky Crafts, MSW, LCSWA, LCASA 12/05/2020 8:25 AM

## 2020-12-05 NOTE — Progress Notes (Signed)
Per [facility], no appropriate beds available at this time. CSW will continue to monitor bed availability at [facility]. Patient remains referred out; last referral sent 3/9 @ 0824. Situation ongoing, CSW will continue to monitor.   Signed:  Corky Crafts, MSW, [place], LCASA 12/05/2020 2:44 PM

## 2020-12-06 DIAGNOSIS — F250 Schizoaffective disorder, bipolar type: Secondary | ICD-10-CM

## 2020-12-06 MED ORDER — TRAZODONE HCL 100 MG PO TABS: 100.0000 mg | ORAL_TABLET | Freq: Every day | ORAL | 0 refills | Status: AC

## 2020-12-06 MED ORDER — FLUPHENAZINE HCL 10 MG PO TABS: 10.0000 mg | ORAL_TABLET | Freq: Every morning | ORAL | 0 refills | Status: AC

## 2020-12-06 MED ORDER — DIVALPROEX SODIUM 500 MG PO DR TAB: 500.0000 mg | DELAYED_RELEASE_TABLET | Freq: Two times a day (BID) | ORAL | 0 refills | Status: AC

## 2020-12-06 NOTE — ED Notes (Signed)
Pt signed paperwork for receiving all his paperwork. Pt is safe for discharge and states he will not harm himself or anyone else. Pt received d/c instructions and understands them.

## 2020-12-06 NOTE — Consult Note (Addendum)
Telepsych Consultation   Location of Patient: [facility] Location of Provider: [facility]  Patient Identification: Ryan York MRN:  [id number] Principal Diagnosis: Schizoaffective disorder, bipolar type ([place]) Diagnosis:  Principal Problem:   Schizoaffective disorder, bipolar type ([place])   Total Time spent with patient: 20 minutes  HPI:  Reassessment: Patient seen via telepsych. Chart reviewed. Ryan York is a 50 year old male with history of schizoaffective disorder who presented under [name] from his brother on 12/02/20 for report of psychosis with aggressive behaviors. He was initially resistant to medications but has been med compliant since 12/04/20.  On my assessment today, patient is significantly improved. He is calm, cooperative, and polite with normal rate and volume of speech. His speech is more organized. He reports good mood, appears euthymic, and states he has been "chilling." Nursing reports no behavioral outbursts today. He is still mildly bizarre, states he feels better after unplugging the hospital bed in his room because of electric zaps, "plus maybe the Depakote worked." He reports good sleep overnight. He denies any AVH and shows no signs of responding to internal stimuli. He admits to being aggressive with his brother prior to admission but denies any thoughts of hurting his brother or others at this time. He states he has spoken on the phone with his brother today, who states that he can come home. He strongly denies any SI/HI. I have encouraged patient's med compliance for continued stability and to avoid future hospital visits, and patient states agreement. He states intent to follow up at [facility], where he has already received services. He is requesting discharge. He provides verbal consent to contact his brother Ryan York [phone]- I attempted to contact with HIPAA compliant voicemail left.  Per TTS assessment 12/02/20: Ryan York is 50 years old  male patient who presents involuntarily to [facility] via [facility] and unaccompanied. 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 Respondent is suffering from hallucinations and believe aliens are doing horrible things. Respondent has become hostile and threatening to family and is danger to himself". Pt reports a history of schizophrenia "since the day I was born'. Pt states that his bipolar is under control "because, I don't eat meat' Pt reports that "I am a celebrity". Pt reports that he sleeps well, usually during the day. Pt reports his appetite is fine. Pt denies manic symptoms. Pt denies suicide ideation and previous suicide attempt. Pt denies intentional self injurious behaviors. Pt denies homicidal ideation. Pt admitted to experiencing racing thoughts. Pt denies paranoia, also exhibit agitation to personnel "you are asking me the same questions". Pt denies drinking alcohol and any other substance use.  Pt did not identified any primary stressor. Pt reports that he lives with his brother and receives a disability check. Pt denies any history of mental illness and substance use. Pt denies any history of abuse or trauma. Pt reports that he has a pending court date in April.  Pt says his is not currently receiving weekly outpatient therapy however he met with a therapist three weeks ago, unable to report therapist name. Pt reports that he is currently prescribed by a psychiatrist trazodone medication and is not currently taking his medication. Pt reports one previous inpatient psychiatric hospitalization in November 2017 at [facility].  Pt is dressed disshelved, alert, oriented x 2 with garble speech and restless motor behavior . Eye contact is fleeting. Pt mood is irritable and affect is anxious. Thought process  is illusions. Pt insight is flashes of insight and judgement is poor. There is indication Pt is  currently responding to internal stimuli or experiencing delusional thought content. Pt was irritable throughout assessment.  Disposition: Patient has stabilized in the ED with psychotropic medications. He shows no evidence of acute risk of harm to self or others and is psych cleared for discharge. Depakote, Prolixin, and trazodone prescriptions sent to [place]. Patient to continue outpatient services at [facility]. ED staff updated.  Past Psychiatric History: See above  Risk to Self:   Risk to Others:   Prior Inpatient Therapy:   Prior Outpatient Therapy:    Past Medical History:  Past Medical History:  Diagnosis Date  . Asthma   . Bipolar disorder ([place])   . Hypertension   . Schizophrenia [facility])     Past Surgical History:  Procedure Laterality Date  . KNEE SURGERY     Family History:  Family History  Problem Relation Age of Onset  . Alzheimer's disease Father    Family Psychiatric  History: See above Social History:  Social History   Substance and Sexual Activity  Alcohol Use No   Comment: denies alcohol use     Social History   Substance and Sexual Activity  Drug Use No   Comment: UDS not available at time of writing    Social History   Socioeconomic History  . Marital status: Single    Spouse name: Not on file  . Number of children: Not on file  . Years of education: Not on file  . Highest education level: Not on file  Occupational History  . Not on file  Tobacco Use  . Smoking status: Former Smoker    Packs/day: 0.50    Years: 10.00    Pack years: 5.00    Types: Cigars  . Smokeless tobacco: Never Used  Vaping Use  . Vaping Use: Never used  Substance and Sexual Activity  . Alcohol use: No    Comment: denies alcohol use  . Drug use: No    Comment: UDS not available at time of writing  . Sexual activity: Not on file  Other Topics Concern  . Not on file  Social History Narrative  . Not on file   Social Determinants of Health   Financial  Resource Strain: Not on file  Food Insecurity: Not on file  Transportation Needs: Not on file  Physical Activity: Not on file  Stress: Not on file  Social Connections: Not on file   Additional Social History:    Allergies:   Allergies  Allergen Reactions  . Hydroxyzine Hcl Other (See Comments)    "Sexual difficulty"- Note: "Vistaril" has been in [place] since 2014 as "nausea and vomiting"/allergic reaction and "Hydroxyzine" has been in since 2019 with an intolerance of "sexual difficulty"  . Lithium Itching  . Risperidone And Related Other (See Comments)    "It shrinks my penis."   . Vistaril [Hydroxyzine] Nausea And Vomiting and Other (See Comments)    "Vistaril" has been in [facility] since 2014 as "nausea and vomiting"/allergic reaction and "Hydroxyzine" has been in since 2019 with an intolerance of "sexual difficulty"    Labs: No results found for this or any previous visit (from the past 48 hour(s)).  Medications:  Current Facility-Administered Medications  Medication Dose Route Frequency Provider Last Rate Last Admin  . acetaminophen (TYLENOL) tablet 650 mg  650 mg Oral Q4H PRN Antonietta Breach, PA-C   650 mg at 12/03/20 2322  .  divalproex (DEPAKOTE) DR tablet 500 mg  500 mg Oral BID Merlyn Lot E, NP   500 mg at 12/06/20 0909  . fluPHENAZine (PROLIXIN) tablet 10 mg  10 mg Oral q AM Merlyn Lot E, NP   10 mg at 12/06/20 0837  . LORazepam (ATIVAN) injection 2 mg  2 mg Intramuscular Q6H PRN Carmin Muskrat, MD      . melatonin tablet 3 mg  3 mg Oral QHS Humes, Kelly, PA-C      . metFORMIN (GLUCOPHAGE-XR) 24 hr tablet 500 mg  500 mg Oral BID WC Antonietta Breach, PA-C   500 mg at 12/06/20 1194  . nicotine (NICODERM CQ - dosed in mg/24 hours) patch 21 mg  21 mg Transdermal Daily Antonietta Breach, PA-C      . traZODone (DESYREL) tablet 100 mg  100 mg Oral QHS Little, Wenda Overland, MD   100 mg at 12/04/20 2236  . ziprasidone (GEODON) injection 20 mg  20 mg Intramuscular Q12H PRN Dorie Rank, MD    20 mg at 12/05/20 1628   Current Outpatient Medications  Medication Sig Dispense Refill  . Alpha-D-Galactosidase (BEANO ULTRA 800) TABS Take 1-2 tablets by mouth See admin instructions. Take 1-2 tablets by mouth before the first bite, or immediately after meals (up to 30 minutes after first bite)    . diphenhydrAMINE HCl (ZZZQUIL) 50 MG/30ML LIQD Take 15-30 mLs by mouth at bedtime as needed (for sleep).    . trazodone (DESYREL) 300 MG tablet Take 100-300 mg by mouth at bedtime.    . Amantadine HCl 100 MG tablet Take 100 mg by mouth 2 (two) times daily. (Patient not taking: Reported on 12/02/2020)    . cetirizine (ZYRTEC) 10 MG tablet Take 10 mg by mouth daily. (Patient not taking: No sig reported)    . clonazePAM (KLONOPIN) 2 MG tablet Take 2 mg by mouth 2 (two) times daily. (Patient not taking: No sig reported)    . diclofenac Sodium (VOLTAREN) 1 % GEL Apply 4 g topically 4 (four) times daily as needed for pain. (Patient not taking: Reported on 12/02/2020)    . divalproex (DEPAKOTE) 500 MG DR tablet Take 500 mg by mouth 2 (two) times daily. (Patient not taking: No sig reported)    . fluPHENAZine (PROLIXIN) 10 MG tablet Take 10-30 mg by mouth See admin instructions. Take 10 mg by mouth once a day and 30 mg at bedtime (Patient not taking: No sig reported)    . ibuprofen (ADVIL,MOTRIN) 400 MG tablet Take 1 tablet (400 mg total) by mouth every 6 (six) hours as needed. (Patient not taking: No sig reported) 30 tablet 0  . metFORMIN (GLUCOPHAGE-XR) 500 MG 24 hr tablet Take 500 mg by mouth in the morning and at bedtime. (Patient not taking: Reported on 12/02/2020)    . methocarbamol (ROBAXIN) 500 MG tablet Take 1 tablet (500 mg total) by mouth 2 (two) times daily. (Patient not taking: Reported on 12/02/2020) 20 tablet 0  . montelukast (SINGULAIR) 10 MG tablet Take 10 mg by mouth daily. (Patient not taking: Reported on 12/02/2020)      Psychiatric Specialty Exam: Physical Exam  Review of Systems  Blood pressure  112/83, pulse 63, temperature (!) 97.4 F (36.3 C), temperature source Oral, resp. rate 15, SpO2 98 %.There is no height or weight on file to calculate BMI.  General Appearance: Casual  Eye Contact:  Good  Speech:  Normal Rate  Volume:  Normal  Mood:  Euthymic  Affect:  Appropriate  and Congruent  Thought Process:  Coherent and Goal Directed  Orientation:  Full (Time, Place, and Person)  Thought Content:  Logical  Suicidal Thoughts:  No  Homicidal Thoughts:  No  Memory:  Immediate;   Fair Recent;   Fair Remote;   Fair  Judgement:  Intact  Insight:  Fair  Psychomotor Activity:  Normal  Concentration:  Concentration: Good and Attention Span: Good  Recall:  [employer] of Knowledge:  Fair  Language:  Good  Akathisia:  No  Handed:  Right  AIMS (if indicated):     Assets:  Communication Skills Desire for Improvement Financial Resources/Insurance Housing Leisure Time Resilience Social Support  ADL's:  Intact  Cognition:  WNL  Sleep:       Disposition: Patient has stabilized in the ED with psychotropic medications. He shows no evidence of acute risk of harm to self or others and is psych cleared for discharge. Depakote, Prolixin, and trazodone prescriptions sent to [place]. Patient to continue outpatient services at [facility]. ED staff updated.  This service was provided via telemedicine using a 2-way, interactive audio and video technology with the identified patient and this provider.  Connye Burkitt, NP 12/06/2020 12:03 PM

## 2020-12-06 NOTE — ED Notes (Signed)
Pt in bed resting  

## 2020-12-06 NOTE — ED Notes (Signed)
Pt received breakfast tray 

## 2020-12-06 NOTE — Discharge Instructions (Addendum)
The psychiatry team evaluated you today and felt you are safe for discharge home.  Please rest and stay hydrated and take your home medicines.  Please follow-up with your outpatient psychiatry team.  Please rest.  If any symptoms change or worsen, please return to the nearest emergency department.

## 2020-12-06 NOTE — ED Provider Notes (Signed)
12:38 PM Psychiatry team just reached out that patient is now psychiatrically cleared for discharge home.  We will go reassess the patient and initiate [name] rescinding.  Anticipate discharge with PCP and outpatient psychiatry and follow-up.  2:13 PM Was just informed that [name] has been rescinded.  We will get his discharge information ready to go for outpatient follow-up.  He reports he is feeling well and agrees with plan of care.  Patient will be discharged.   Clinical Impression: 1. Noncompliance with medication regimen   2. Involuntary commitment     Disposition: Discharge  Condition: Good  I have discussed the results, Dx and Tx plan with the pt(& family if present). He/she/they expressed understanding and agree(s) with the plan. Discharge instructions discussed at great length. Strict return precautions discussed and pt &/or family have verbalized understanding of the instructions. No further questions at time of discharge.    Current Discharge Medication List      Follow Up: Vesta Mixer [address]  [address] [place] [address] [phone]     [facility] EMERGENCY DEPARTMENT [address] [id number] mc [place] [address] [phone]       Payal Stanforth, Canary Brim, MD 12/06/20 [phone]

## 2020-12-06 NOTE — ED Notes (Signed)
MD signed paperwork  to rescind [name] papers. This RN faxed to clerk of court.

## 2021-01-02 ENCOUNTER — Emergency Department (HOSPITAL_COMMUNITY)
Admission: EM | Admit: 2021-01-02 | Discharge: 2021-01-05 | Disposition: A | Discharge status: Other Acute Inpatient Hospital | Payer: Medicaid Other | Attending: Emergency Medicine | Admitting: Emergency Medicine

## 2021-01-02 ENCOUNTER — Encounter (HOSPITAL_COMMUNITY): Payer: Self-pay | Admitting: Emergency Medicine

## 2021-01-02 DIAGNOSIS — Z20822 Contact with and (suspected) exposure to covid-19: Secondary | ICD-10-CM | POA: Insufficient documentation

## 2021-01-02 DIAGNOSIS — R4689 Other symptoms and signs involving appearance and behavior: Secondary | ICD-10-CM

## 2021-01-02 DIAGNOSIS — I10 Essential (primary) hypertension: Secondary | ICD-10-CM | POA: Insufficient documentation

## 2021-01-02 DIAGNOSIS — Z87891 Personal history of nicotine dependence: Secondary | ICD-10-CM | POA: Insufficient documentation

## 2021-01-02 DIAGNOSIS — J45909 Unspecified asthma, uncomplicated: Secondary | ICD-10-CM | POA: Diagnosis not present

## 2021-01-02 DIAGNOSIS — R456 Violent behavior: Secondary | ICD-10-CM | POA: Diagnosis not present

## 2021-01-02 DIAGNOSIS — Z046 Encounter for general psychiatric examination, requested by authority: Secondary | ICD-10-CM | POA: Diagnosis present

## 2021-01-02 DIAGNOSIS — F250 Schizoaffective disorder, bipolar type: Secondary | ICD-10-CM | POA: Diagnosis not present

## 2021-01-02 MED ORDER — STERILE WATER FOR INJECTION IJ SOLN
INTRAMUSCULAR | Status: AC
  Filled 2021-01-02: qty 10

## 2021-01-02 MED ORDER — DIPHENHYDRAMINE HCL 50 MG/ML IJ SOLN
25.0000 mg | Freq: Once | INTRAMUSCULAR | Status: AC
  Administered 2021-01-02: 25 mg via INTRAMUSCULAR

## 2021-01-02 MED ORDER — LORAZEPAM 2 MG/ML IJ SOLN
2.0000 mg | Freq: Once | INTRAMUSCULAR | Status: AC
  Administered 2021-01-02: 2 mg via INTRAMUSCULAR
  Filled 2021-01-02: qty 1

## 2021-01-02 MED ORDER — ZIPRASIDONE MESYLATE 20 MG IM SOLR
20.0000 mg | Freq: Once | INTRAMUSCULAR | Status: AC
  Administered 2021-01-02: 20 mg via INTRAMUSCULAR
  Filled 2021-01-02: qty 20

## 2021-01-02 MED ORDER — DIPHENHYDRAMINE HCL 50 MG/ML IJ SOLN
25.0000 mg | Freq: Once | INTRAMUSCULAR | Status: DC
  Filled 2021-01-02: qty 1

## 2021-01-03 ENCOUNTER — Encounter (HOSPITAL_COMMUNITY): Payer: Self-pay | Admitting: Student

## 2021-01-03 DIAGNOSIS — F250 Schizoaffective disorder, bipolar type: Secondary | ICD-10-CM | POA: Diagnosis not present

## 2021-01-03 DIAGNOSIS — F201 Disorganized schizophrenia: Secondary | ICD-10-CM | POA: Insufficient documentation

## 2021-01-03 LAB — CBC
HCT: 38.4 % — ABNORMAL LOW (ref 39.0–52.0)
Hemoglobin: 12.7 g/dL — ABNORMAL LOW (ref 13.0–17.0)
MCH: 30.8 pg (ref 26.0–34.0)
MCHC: 33.1 g/dL (ref 30.0–36.0)
MCV: 93.2 fL (ref 80.0–100.0)
Platelets: 145 10*3/uL — ABNORMAL LOW (ref 150–400)
RBC: 4.12 MIL/uL — ABNORMAL LOW (ref 4.22–5.81)
RDW: 13.6 % (ref 11.5–15.5)
WBC: 5.2 10*3/uL (ref 4.0–10.5)
nRBC: 0 % (ref 0.0–0.2)

## 2021-01-03 LAB — COMPREHENSIVE METABOLIC PANEL
ALT: 23 U/L (ref 0–44)
AST: 22 U/L (ref 15–41)
Albumin: 3.5 g/dL (ref 3.5–5.0)
Alkaline Phosphatase: 52 U/L (ref 38–126)
Anion gap: 5 (ref 5–15)
BUN: 20 mg/dL (ref 6–20)
CO2: 29 mmol/L (ref 22–32)
Calcium: 8.4 mg/dL — ABNORMAL LOW (ref 8.9–10.3)
Chloride: 106 mmol/L (ref 98–111)
Creatinine, Ser: 1.02 mg/dL (ref 0.61–1.24)
GFR, Estimated: >60 mL/min (ref >60)
Glucose, Bld: 100 mg/dL — ABNORMAL HIGH (ref 70–99)
Potassium: 3.4 mmol/L — ABNORMAL LOW (ref 3.5–5.1)
Sodium: 140 mmol/L (ref 135–145)
Total Bilirubin: 0.5 mg/dL (ref 0.3–1.2)
Total Protein: 6.3 g/dL — ABNORMAL LOW (ref 6.5–8.1)

## 2021-01-03 LAB — RESP PANEL BY RT-PCR (FLU A&B, COVID) ARPGX2
Influenza A by PCR: NEGATIVE
Influenza B by PCR: NEGATIVE
SARS Coronavirus 2 by RT PCR: NEGATIVE

## 2021-01-03 LAB — ETHANOL: Alcohol, Ethyl (B): <10 mg/dL (ref <10)

## 2021-01-03 LAB — ACETAMINOPHEN LEVEL: Acetaminophen (Tylenol), Serum: <10 ug/mL — ABNORMAL LOW (ref 10–30)

## 2021-01-03 LAB — RAPID URINE DRUG SCREEN, HOSP PERFORMED
Amphetamines: NOT DETECTED
Barbiturates: NOT DETECTED
Benzodiazepines: NOT DETECTED
Cocaine: NOT DETECTED
Opiates: NOT DETECTED
Tetrahydrocannabinol: NOT DETECTED

## 2021-01-03 LAB — SALICYLATE LEVEL: Salicylate Lvl: <7 mg/dL — ABNORMAL LOW (ref 7.0–30.0)

## 2021-01-03 MED ORDER — ONDANSETRON HCL 4 MG PO TABS: 4.0000 mg | ORAL_TABLET | Freq: Three times a day (TID) | ORAL | Status: DC | PRN

## 2021-01-03 MED ORDER — POTASSIUM CHLORIDE CRYS ER 20 MEQ PO TBCR: 40.0000 meq | EXTENDED_RELEASE_TABLET | Freq: Once | ORAL | Status: DC

## 2021-01-03 MED ORDER — ALUM & MAG HYDROXIDE-SIMETH 200-200-20 MG/5ML PO SUSP: 30.0000 mL | Freq: Four times a day (QID) | ORAL | Status: DC | PRN

## 2021-01-03 MED ORDER — ZIPRASIDONE MESYLATE 20 MG IM SOLR: 20.0000 mg | Freq: Four times a day (QID) | INTRAMUSCULAR | Status: DC | PRN

## 2021-01-03 MED ORDER — ACETAMINOPHEN 325 MG PO TABS
650.0000 mg | ORAL_TABLET | ORAL | Status: DC | PRN
  Filled 2021-01-03: qty 2

## 2021-01-03 MED ORDER — DIVALPROEX SODIUM 250 MG PO DR TAB
500.0000 mg | DELAYED_RELEASE_TABLET | Freq: Two times a day (BID) | ORAL | Status: DC
  Administered 2021-01-03 – 2021-01-05 (×5): 500 mg via ORAL
  Filled 2021-01-03 (×5): qty 2

## 2021-01-03 NOTE — ED Notes (Signed)
TTS in progress 

## 2021-01-03 NOTE — BH Assessment (Signed)
Comprehensive Clinical Assessment (CCA) Note  01/03/2021 Ryan York [id number]   Disposition:  Per Berneice Heinrichina Tate, NP, Recommends inpatient treatment.  The patient demonstrates the following risk factors for suicide: Chronic risk factors for suicide include: long standing history of mental illness, minimal support, essentially homeless. Acute risk factors for suicide include: Patient has unresolved grief stemming from his mother's death, lack of resources and support, problematic relationship with his brother.. Protective factors for this patient include: Patient denies current SI/HI/Psych, he is engaged with a provider and states that he wants to continue to live.  Considering these factors, the overall suicide risk at this point appears to be low. Patient is appropriate for outpatient follow up.  AIMS   Flowsheet Row [facility]  AIMS Total Score 0    AUDIT   Flowsheet Row [facility]Dis[facility]) [facility] Admission (Discharged) [facility]  Alcohol Use Disorder Identification Test Final Score (AUDIT) 1 0    PHQ2-9   Flow[facility]  PHQ-2 Total Score 2  PHQ-9 Total Score 10    Flowsheet Row ED [facility]02/2020 [facility] ED from [facility]18[facility]2018 [facility] from 05/10/2018 in [facility]  C-SSRS RISK CATEGORY Error: Q3, 4, or 5 should not be populated when Q2 is No Moderate Risk No Risk      [name] by his brother for non-compliance with taking his medication for his schizophrenia and being aggressive with violent behaviors. Brother states that patient has been hearing voices and seeing people who are not there and seeing demons.   Patient denies that he is a danger to himself or otthers and he is denying any current psychosis. However, he did make a statement that "the voices will stop once my hair grows together."  He states that he and his brother had an argument  with  each other last night and he states that his brother no longer wants him living in the same house with him. Patient states that this is not the first time that his brother has called the police on him.  He states that his mother died recently and he states that he and his brotehr are living in her home.  Patient states that his brother was gone for thirty years and is now returning and trying to control everything.  Patient states that he was living in an indeprendent living program and states that he lost his housing and had to return to his mother's home and he states that things are just not working out.  Patient states that he needs assistance with housing.  Patient denies current SI/HI/Psychosis.  Patient is alert and oriented.  He is irritated and agitated with his brother, but does not appear to be a danger to himself or others.  His judgment, insight and impulse control are most always impaired due to his mental illness.  His thoughts are tangential.  He does not currently appear to be responding to any internal stimuli.  Chief Complaint:  Chief Complaint  Patient presents with  . Psychiatric Evaluation   Visit Diagnosis: F20.1 Schizophrenia   CCA Screening, Triage and Referral (STR)  Patient Reported Information How did you hear about us? Legal System  Referral name: [facility]  [employer] on [name] petitioned by his brother  Referral phone number: No data recorded  Whom do you see for routine medical problems? Primary Care  Practice/Facility Name: Fleet Contras, MD  Practice/Facility Phone Number: No data recorded Name of Contact: No data recorded Contact Number: No data recorded Contact Fax Number: No data recorded Prescriber  Name: No data recorded Prescriber Address (if known): No data recorded  What Is the Reason for Your Visit/Call Today? Patient has been diagnosed with schizophrenia.  He was petitioned by his brother for non-compliance with taking his medication, argumentative and aggressive behavior and brother states that patient is hallucinating  How Long Has This Been Causing You Problems? 1-6 months  What Do You Feel Would Help You the Most Today? Treatment for Depression or other mood problem   Have You Recently Been in Any Inpatient Treatment (Hospital/Detox/Crisis Center/28-Day Program)? Yes  Name/Location of Program/Hospital:Patient was just seen at the [facility] last month  How Long Were You There? overnight  When Were You Discharged? 12/04/2020   Have You Ever Received Services From [employer] [facility] [facility] Before? Yes  Who Do You See at [facility]? Patient has been seen in the ED and [facility] and he has been admitted to [facility] in the past   Have You Recently Had Any Thoughts About Hurting Yourself? No  Are You Planning to Commit Suicide/Harm Yourself At This time? No   Have you Recently Had Thoughts About Hurting Someone Karolee Ohs? No  Explanation: No data recorded  Have You Used Any Alcohol or Drugs in the Past 24 Hours? No  How Long Ago Did You Use Drugs or Alcohol? No data recorded What Did You Use and How Much? No data recorded  Do You Currently Have a Therapist/Psychiatrist? Yes  Name of Therapist/Psychiatrist: Patient states that he is seen at [facility] for medication management   Have You Been Recently Discharged From Any Office Practice or Programs? No  Explanation of Discharge From Practice/Program: No data recorded    CCA Screening Triage Referral Assessment Type of Contact: Tele-Assessment  Is this Initial or Reassessment? Initial Assessment  Date Telepsych consult ordered in [facility]:  01/03/2021  Time Telepsych consult ordered in [facility]:  0139   Patient Reported Information Reviewed?  Yes  Patient Left Without Being Seen? No data recorded Reason for Not Completing Assessment: No data recorded  Collateral Involvement: No collateral presents   Does Patient Have a Court Appointed Legal Guardian? No data recorded Name and Contact of Legal Guardian: -- (patient is his own legal guardian )  If Minor and Not Living with Parent(s), Who has Custody? UTA  Is CPS involved or ever been involved? Never  Is APS involved or ever been involved? Never   Patient Determined To Be At Risk for Harm To Self or Others Based on Review of Patient Reported Information or Presenting Complaint? No  Method: No Plan  Availability of Means: No access or NA  Intent: Vague intent or NA  Notification Required: No need or identified person  Additional Information for Danger to Others Potential: Active psychosis  Additional Comments for Danger to Others Potential: Law Enforcement have been called out  Are There Guns or Other Weapons in Your Home? No  Types of Guns/Weapons: No data recorded Are These Weapons Safely Secured?                            No data recorded Who Could Verify You Are Able To  Have These Secured: No data recorded Do You Have any Outstanding Charges, Pending Court Dates, Parole/Probation? Pt reports pending court date in April.  Contacted To Inform of Risk of Harm To Self or Others: Other: Comment (none reported)   Location of Assessment: [facility]   Does Patient Present under Involuntary Commitment? Yes  [name] Papers Initial File Date: 01/02/2021   [place] of Residence: [place]   Patient Currently Receiving the Following Services: Medication Management (patient states that he needs assistance with housing)   Determination of Need: Emergent (2 hours)   Options For Referral: Inpatient Hospitalization     CCA Biopsychosocial Intake/Chief Complaint:  Patient was brought to the [facility] on [name] petitioned by his brother for non-compliance with taking his  medication for his schizophrenia and being aggressive with violent behaviors. Brother states that patient has been hearing voices and seeing people who are not there and seeing demons.  Patient denies that he is a danger to himself or otthers and he is denying any current psychosis.  He states that he and his brother had an argument  with  each other last night and he states that his brother no longer wants him living in the same house with him. Patient states that this is not the first time that his brother has called the police on him.  He states that his mother died recently and he states that he and his brotehr are living in her home.  Patient states that his brother was gone for thirty years and is now returning and trying to control everything.  Patient states that he was living in an indeprendent living program and states that he lost his housing and had to return to his mother's home and he states that things are just not working out.  Patient states that he needs assistance with housing.  Patient denies current SI/HI/Psychosis.  Current Symptoms/Problems: Patient is cooperative, but agitated with his brother   Patient Reported Schizophrenia/Schizoaffective Diagnosis in Past: Yes   Strengths: UTA  Preferences: Patient has no preferences that require accommodation  Abilities: UTA   Type of Services Patient Feels are Needed: Patient states that he needs assistance with housing   Initial Clinical Notes/Concerns: SI   Mental Health Symptoms Depression:  None   Duration of Depressive symptoms: No data recorded  Mania:  Racing thoughts; Recklessness; Increased Energy; Irritability; Change in energy/activity   Anxiety:   None   Psychosis:  None   Duration of Psychotic symptoms: Less than six months   Trauma:  None   Obsessions:  None   Compulsions:  None   Inattention:  None   Hyperactivity/Impulsivity:  Difficulty waiting turn; Feeling of restlessness; Fidgets with  hands/feet   Oppositional/Defiant Behaviors:  None   Emotional Irregularity:  None   Other Mood/Personality Symptoms:  No data recorded   Mental Status Exam Appearance and self-care  Stature:  Tall   Weight:  Average weight   Clothing:  Disheveled   Grooming:  Neglected   Cosmetic use:  None   Posture/gait:  Normal   Motor activity:  Agitated   Sensorium  Attention:  Distractible   Concentration:  Focuses on irrelevancies; Scattered   Orientation:  Object; Person   Recall/memory:  Defective in Recent; Defective in Short-term; Defective in Remote   Affect and Mood  Affect:  Anxious   Mood:  Irritable   Relating  Eye contact:  Fleeting   Facial expression:  Tense   Attitude toward examiner:  Cooperative  Thought and Language  Speech flow: Flight of Ideas   Thought content:  Illusions   Preoccupation:  Religion   Hallucinations:  None   Organization:  No data recorded  [employer] of Knowledge:  Fair   Intelligence:  Average   Abstraction:  Abstract   Judgement:  Poor   Reality Testing:  Distorted   Insight:  Flashes of insight   Decision Making:  Confused   Social Functioning  Social Maturity:  Irresponsible   Social Judgement:  Impropriety   Stress  Stressors:  Family conflict; Legal   Coping Ability:  Deficient supports   Skill Deficits:  Self-control; Activities of daily living; Communication; Decision making; Intellect/education; Interpersonal; Responsibility; Self-care   Supports:  Support needed     Religion: Religion/Spirituality How Might This Affect Treatment?: UTA  Leisure/Recreation: Leisure / Recreation Do You Have Hobbies?: No  Exercise/Diet: Exercise/Diet Do You Exercise?: No Have You Gained or Lost A Significant Amount of Weight in the Past Six Months?: No Do You Follow a Special Diet?: No Do You Have Any Trouble Sleeping?: No   CCA Employment/Education Employment/Work  Situation: Employment / Work Situation Employment situation: On disability How long has patient been on disability: UTA Patient's job has been impacted by current illness: No What is the longest time patient has a held a job?: UTA Where was the patient employed at that time?: UTA Has patient ever been in the [employer]?: No  Education: Education Is Patient Currently Attending School?: No Last Grade Completed: 12 Name of High School: [employer] Did Garment/textile technologist From [employer]?: Yes Did Theme park manager?: No Did You Have Any Special Interests In School?: UTA Did You Have An Individualized Education Program (IIEP): No Did You Have Any Difficulty At School?: No Patient's Education Has Been Impacted by Current Illness: No   CCA Family/Childhood History Family and Relationship History: Family history Marital status: Single Are you sexually active?: No What is your sexual orientation?: Heterosexual Has your sexual activity been affected by drugs, alcohol, medication, or emotional stress?: UTA Does patient have children?: No  Childhood History:  Childhood History By whom was/is the patient raised?: Mother Additional childhood history information: Pt reports that his mother died, "I am glad she is dead, she kept me jail" Description of patient's relationship with caregiver when they were a child: negative How were you disciplined when you got in trouble as a child/adolescent?: UTA Does patient have siblings?: Yes Number of Siblings: 1 Description of patient's current relationship with siblings: patient states that he and his brother do not get along Did patient suffer any verbal/emotional/physical/sexual abuse as a child?: No Did patient suffer from severe childhood neglect?: No Has patient ever been sexually abused/assaulted/raped as an adolescent or adult?: No Was the patient ever a victim of a crime or a disaster?: No Witnessed domestic violence?: No Has patient been  affected by domestic violence as an adult?: No  Child/Adolescent Assessment:     CCA Substance Use Alcohol/Drug Use: Alcohol / Drug Use Pain Medications: See MAR Prescriptions: See MAR Over the Counter: See MAR History of alcohol / drug use?: No history of alcohol / drug abuse Longest period of sobriety (when/how long): unknown                          ASAM's:  Six Dimensions of Multidimensional Assessment  Dimension 1:  Acute Intoxication and/or Withdrawal Potential:      Dimension 2:  Biomedical Conditions and Complications:      Dimension 3:  Emotional, Behavioral, or Cognitive Conditions and Complications:     Dimension 4:  Readiness to Change:     Dimension 5:  Relapse, Continued use, or Continued Problem Potential:     Dimension 6:  Recovery/Living Environment:     ASAM Severity Score:    ASAM Recommended Level of Treatment:     Substance use Disorder (SUD)    Recommendations for Services/Supports/Treatments:    DSM5 Diagnoses: Patient Active Problem List   Diagnosis Date Noted  . Disorganized schizophrenia (HCC)   . Asthma 08/05/2016  . Tobacco use disorder 04/04/2016  . Schizoaffective disorder, bipolar type (HCC) 04/20/2012       Referrals to Alternative Service(s): Referred to Alternative Service(s):   Place:   Date:   Time:    Referred to Alternative Service(s):   Place:   Date:   Time:    Referred to Alternative Service(s):   Place:   Date:   Time:    Referred to Alternative Service(s):   Place:   Date:   Time:     Anouk Critzer J Anasia Agro, LCAS

## 2021-01-03 NOTE — ED Notes (Signed)
Pt alert in room, lying on bed,  saying "come here, doggie!"

## 2021-01-03 NOTE — ED Notes (Signed)
Pt stating he is cold and asking for the heat to be turned up. Staff adjusted thermostat, patient refusing to believe staff, stating "Myna Hidalgo will get you for this. How is this rehabilitating, freezing someone?" And repeating "Karma"   Got warm blanket for pt.   RN aware, will continue to monitor.

## 2021-01-03 NOTE — ED Notes (Signed)
Patient is still in his street clothes 2pt. Restraints , security in to wand patient. Currently patient sleeping on  Stretcher.

## 2021-01-03 NOTE — ED Notes (Signed)
Belongings inventoried and placed in locker 4  

## 2021-01-03 NOTE — ED Notes (Signed)
Patient much calmer, wrist restraints removed for patient to eat breakfast , aware as long and he isn't threaten to staff restraints can remain off. Patient agreeable at present.

## 2021-01-03 NOTE — ED Notes (Signed)
Patient given snack. Pt calm and cooperative.

## 2021-01-03 NOTE — ED Notes (Signed)
Pt agitated, yelling at staff; security at bedside

## 2021-01-03 NOTE — BH Assessment (Signed)
Patient's brother/ [name] petitioner presents to [facility] requesting information. This counselor explained we cannot discuss his care unless we had consent but could provide him with disposition information since he is petitioner. This counselor informed him patient is recommended for in patient. He expressed gratitude and left his phone number if we needed to reach him for more information.   [name] petitioner/brother: Felipa Eth [phone]

## 2021-01-03 NOTE — ED Notes (Signed)
Appetite good at breakfast, remains out of restraints at present.

## 2021-01-03 NOTE — ED Notes (Signed)
Pt resting quietly at this time

## 2021-01-03 NOTE — Consult Note (Signed)
Telepsych Consultation   Reason for Consult: Psychiatry provider reassessment Referring Physician: Dr. Jeraldine Loots Location of Patient: [facility] emergency department Location of Provider: Behavioral Health TTS Department  Patient Identification: Ryan York MRN:  [id number] Principal Diagnosis: Schizoaffective disorder, bipolar type [facility]) Diagnosis:  Principal Problem:   Schizoaffective disorder, bipolar type (HCC)   Total Time spent with patient: 30 minutes  Subjective:   Ryan York is a 50 y.o. male patient.  He states "my brother gets mad and because the police on me, we disagree about issues involving the house."  Patient reports his mother recently passed away and his brother is the executor of his mother's estate.  He currently resides with brother but states he would "like a new place to stay."  HPI:   Ryan York presented to emergency department under involuntary commitment.  Petitioner reports that patient has been acting out aggressively, not taking his medications and not sleeping.  Petitioner also reports patient has been "speaking voices and demons."  Patient reassessed by nurse practitioner.  He reports he only takes his sleep medication.  States "I am not bipolar I am schizophrenic and it does not act out unless I do not get sleep, I only take my sleep medication."  He reports he takes trazodone daily.  He is not currently followed by outpatient psychiatry, reports he was followed by [facility] in the past but recently "let them go, I was homeless for 3 months and they did not find me a place to stay."  Patient endorses auditory hallucinations, no command hallucinations reported.  He states "there is nothing for the voices, the only thing that [place] can [place]."  Patient noted to have tangential conversation, anxious when discussing disposition.  He reports he has been treated for diagnosis of schizophrenia for 30  years and has tried many medications.  He states he will not take any medications that make him feel sleepy or tired.  Hailey noted to look around the room when discussing hallucinations.  Concerned that patient could be responding to internal stimuli.  He does endorse auditory hallucinations.  Patient denies suicidal and homicidal ideations.  He denies any history of suicide attempts, denies any self-harm behaviors.  Patient appears to have paranoia surrounding his brother.  States "I get treated like an out of towner but I was born here, I just want to be happy."  Ryan York resides in [place] with his brother, Felipa Eth.  He denies access to weapons.  He is currently not employed.  He denies alcohol and substance use.  He reports average sleep and appetite.  Patient offered support and encouragement.  Past Psychiatric History: Schizoaffective disorder, bipolar type, disorganized schizophrenia  Risk to Self:  Denies Risk to Others:  Denies Prior Inpatient Therapy:   [facility] 2017 Prior Outpatient Therapy:   Currently denies outpatient follow-up  Past Medical History:  Past Medical History:  Diagnosis Date  . Asthma   . Bipolar disorder (HCC)   . Hypertension   . Schizophrenia [facility])     Past Surgical History:  Procedure Laterality Date  . KNEE SURGERY     Family History:  Family History  Problem Relation Age of Onset  . Alzheimer's disease Father    Family Psychiatric  History: None reported Social History:  Social History   Substance and Sexual Activity  Alcohol Use No   Comment: denies alcohol use     Social  History   Substance and Sexual Activity  Drug Use No   Comment: UDS not available at time of writing    Social History   Socioeconomic History  . Marital status: Single    Spouse name: Not on file  . Number of children: Not on file  . Years of education: Not on file  . Highest education level: Not on file  Occupational  History  . Not on file  Tobacco Use  . Smoking status: Former Smoker    Packs/day: 0.50    Years: 10.00    Pack years: 5.00    Types: Cigars  . Smokeless tobacco: Never Used  Vaping Use  . Vaping Use: Never used  Substance and Sexual Activity  . Alcohol use: No    Comment: denies alcohol use  . Drug use: No    Comment: UDS not available at time of writing  . Sexual activity: Not on file  Other Topics Concern  . Not on file  Social History Narrative  . Not on file   Social Determinants of Health   Financial Resource Strain: Not on file  Food Insecurity: Not on file  Transportation Needs: Not on file  Physical Activity: Not on file  Stress: Not on file  Social Connections: Not on file   Additional Social History:    Allergies:   Allergies  Allergen Reactions  . Hydroxyzine Hcl Other (See Comments)    "Sexual difficulty"- Note: "Vistaril" has been in Epic since 2014 as "nausea and vomiting"/allergic reaction and "Hydroxyzine" has been in since 2019 with an intolerance of "sexual difficulty"  . Lithium Itching  . Risperidone And Related Other (See Comments)    "It shrinks my penis."   . Vistaril [Hydroxyzine] Nausea And Vomiting and Other (See Comments)    "Vistaril" has been in Epic since 2014 as "nausea and vomiting"/allergic reaction and "Hydroxyzine" has been in since 2019 with an intolerance of "sexual difficulty"    Labs:  Results for orders placed or performed during the hospital encounter of 01/02/21 (from the past 48 hour(s))  Comprehensive metabolic panel     Status: Abnormal   Collection Time: 01/02/21 10:54 PM  Result Value Ref Range   Sodium 140 135 - 145 mmol/L   Potassium 3.4 (L) 3.5 - 5.1 mmol/L   Chloride 106 98 - 111 mmol/L   CO2 29 22 - 32 mmol/L   Glucose, Bld 100 (H) 70 - 99 mg/dL    Comment: Glucose reference range applies only to samples taken after fasting for at least 8 hours.   BUN 20 6 - 20 mg/dL   Creatinine, Ser 1.61 0.61 - 1.24 mg/dL    Calcium 8.4 (L) 8.9 - 10.3 mg/dL   Total Protein 6.3 (L) 6.5 - 8.1 g/dL   Albumin 3.5 3.5 - 5.0 g/dL   AST 22 15 - 41 U/L   ALT 23 0 - 44 U/L   Alkaline Phosphatase 52 38 - 126 U/L   Total Bilirubin 0.5 0.3 - 1.2 mg/dL   GFR, Estimated >09 >60 mL/min    Comment: (NOTE) Calculated using the CKD-EPI Creatinine Equation (2021)    Anion gap 5 5 - 15    Comment: Performed at [facility], [address], [place], [place] [address]  CBC     Status: Abnormal   Collection Time: 01/02/21 10:54 PM  Result Value Ref Range   WBC 5.2 4.0 - 10.5 K/uL   RBC 4.12 (L) 4.22 - 5.81  MIL/uL   Hemoglobin 12.7 (L) 13.0 - 17.0 g/dL   HCT 60.6 (L) 30.1 - 60.1 %   MCV 93.2 80.0 - 100.0 fL   MCH 30.8 26.0 - 34.0 pg   MCHC 33.1 30.0 - 36.0 g/dL   RDW 09.3 23.5 - 57.3 %   Platelets 145 (L) 150 - 400 K/uL    Comment: REPEATED TO VERIFY   nRBC 0.0 0.0 - 0.2 %    Comment: Performed at [facility], [address] [place], [place] [address]  Acetaminophen level     Status: Abnormal   Collection Time: 01/02/21 10:55 PM  Result Value Ref Range   Acetaminophen (Tylenol), Serum <10 (L) 10 - 30 ug/mL    Comment: (NOTE) Therapeutic concentrations vary significantly. A range of 10-30 ug/mL  may be an effective concentration for many patients. However, some  are best treated at concentrations outside of this range. Acetaminophen concentrations >150 ug/mL at 4 hours after ingestion  and >50 ug/mL at 12 hours after ingestion are often associated with  toxic reactions.  Performed at [facility], [address], [place], [place] [address]   Salicylate level     Status: Abnormal   Collection Time: 01/02/21 10:55 PM  Result Value Ref Range   Salicylate Lvl <7.0 (L) 7.0 - 30.0 mg/dL    Comment: Performed at [facility], [address] [place], [place] [address]  Ethanol     Status: None   Collection Time: 01/02/21 10:55 PM  Result Value Ref Range   Alcohol, Ethyl (B) <10 <10 mg/dL     Comment: (NOTE) Lowest detectable limit for serum alcohol is 10 mg/dL.  For medical purposes only. Performed at [facility], [address] [place], [place] [address]   Resp Panel by RT-PCR (Flu A&B, Covid) Nasopharyngeal Swab     Status: None   Collection Time: 01/02/21 10:55 PM   Specimen: Nasopharyngeal Swab; Nasopharyngeal(NP) swabs in vial transport medium  Result Value Ref Range   SARS Coronavirus 2 by RT PCR NEGATIVE NEGATIVE    Comment: (NOTE) SARS-CoV-2 target nucleic acids are NOT DETECTED.  The SARS-CoV-2 RNA is generally detectable in upper respiratory specimens during the acute phase of infection. The lowest concentration of SARS-CoV-2 viral copies this assay can detect is 138 copies/mL. A negative result does not preclude SARS-Cov-2 infection and should not be used as the sole basis for treatment or other patient management decisions. A negative result may occur with  improper specimen collection/handling, submission of specimen other than nasopharyngeal swab, presence of viral mutation(s) within the areas targeted by this assay, and inadequate number of viral copies(<138 copies/mL). A negative result must be combined with clinical observations, patient history, and epidemiological information. The expected result is Negative.  Fact Sheet for Patients:  BloggerCourse.com  Fact Sheet for Healthcare Providers:  SeriousBroker.it  This test is no t yet approved or cleared by the [place] FDA and  has been authorized for detection and/or diagnosis of SARS-CoV-2 by FDA under an Emergency Use Authorization (EUA). This EUA will remain  in effect (meaning this test can be used) for the duration of the COVID-19 declaration under Section 564(b)(1) of the Act, 21 U.S.C.section 360bbb-3(b)(1), unless the authorization is terminated  or revoked sooner.       Influenza A by PCR NEGATIVE NEGATIVE   Influenza  B by PCR NEGATIVE NEGATIVE    Comment: (NOTE) The Xpert Xpress SARS-CoV-2/FLU/RSV plus assay is intended  as an aid in the diagnosis of influenza from Nasopharyngeal swab specimens and should not be used as a sole basis for treatment. Nasal washings and aspirates are unacceptable for Xpert Xpress SARS-CoV-2/FLU/RSV testing.  Fact Sheet for Patients: BloggerCourse.comhttps://www.fda.gov/media/152166/download  Fact Sheet for Healthcare Providers: SeriousBroker.ithttps://www.fda.gov/media/152162/download  This test is not yet approved or cleared by the [place] FDA and has been authorized for detection and/or diagnosis of SARS-CoV-2 by FDA under an Emergency Use Authorization (EUA). This EUA will remain in effect (meaning this test can be used) for the duration of the COVID-19 declaration under Section 564(b)(1) of the Act, 21 U.S.C. section 360bbb-3(b)(1), unless the authorization is terminated or revoked.  Performed at [facility], [address] [place], [place] [address]   Urine rapid drug screen (hosp performed)     Status: None   Collection Time: 01/03/21 10:16 AM  Result Value Ref Range   Opiates NONE DETECTED NONE DETECTED   Cocaine NONE DETECTED NONE DETECTED   Benzodiazepines NONE DETECTED NONE DETECTED   Amphetamines NONE DETECTED NONE DETECTED   Tetrahydrocannabinol NONE DETECTED NONE DETECTED   Barbiturates NONE DETECTED NONE DETECTED    Comment: (NOTE) DRUG SCREEN FOR MEDICAL PURPOSES ONLY.  IF CONFIRMATION IS NEEDED FOR ANY PURPOSE, NOTIFY LAB WITHIN 5 DAYS.  LOWEST DETECTABLE LIMITS FOR URINE DRUG SCREEN Drug Class                     Cutoff (ng/mL) Amphetamine and metabolites    1000 Barbiturate and metabolites    200 Benzodiazepine                 200 Tricyclics and metabolites     300 Opiates and metabolites        300 Cocaine and metabolites        300 THC                            50 Performed at [facility], [address] [place], [place] [address]      Medications:  Current Facility-Administered Medications  Medication Dose Route Frequency Provider Last Rate Last Admin  . acetaminophen (TYLENOL) tablet 650 mg  650 mg Oral Q4H PRN Palumbo, April, MD      . alum & mag hydroxide-simeth (MAALOX/MYLANTA) 200-200-20 MG/5ML suspension 30 mL  30 mL Oral Q6H PRN Palumbo, April, MD      . divalproex (DEPAKOTE) DR tablet 500 mg  500 mg Oral BID Petrucelli, Samantha R, PA-C   500 mg at 01/03/21 0932  . ondansetron (ZOFRAN) tablet 4 mg  4 mg Oral Q8H PRN Palumbo, April, MD      . potassium chloride SA (KLOR-CON) CR tablet 40 mEq  40 mEq Oral Once Petrucelli, Samantha R, PA-C      . ziprasidone (GEODON) injection 20 mg  20 mg Intramuscular Q6H PRN Gerhard MunchLockwood, Robert, MD       Current Outpatient Medications  Medication Sig Dispense Refill  . divalproex (DEPAKOTE) 500 MG DR tablet Take 1 tablet (500 mg total) by mouth 2 (two) times daily. 60 tablet 0  . fluPHENAZine (PROLIXIN) 10 MG tablet Take 1 tablet (10 mg total) by mouth in the morning. 30 tablet 0  . ibuprofen (ADVIL,MOTRIN) 400 MG tablet Take 1 tablet (400 mg total) by mouth every 6 (six) hours as needed. 30 tablet 0  . methocarbamol (ROBAXIN) 500 MG tablet Take 1 tablet (500 mg total)  by mouth 2 (two) times daily. 20 tablet 0  . traZODone (DESYREL) 100 MG tablet Take 1 tablet (100 mg total) by mouth at bedtime. 30 tablet 0    Musculoskeletal: Strength & Muscle Tone: within normal limits Gait & Station: normal Patient leans: N/A  Psychiatric Specialty Exam: Physical Exam Vitals and nursing note reviewed.  Constitutional:      Appearance: He is well-developed.  HENT:     Head: Normocephalic.  Cardiovascular:     Rate and Rhythm: Normal rate.  Pulmonary:     Effort: Pulmonary effort is normal.  Neurological:     Mental Status: He is alert and oriented to person, place, and time.  Psychiatric:        Attention and Perception: Attention normal. He perceives auditory hallucinations.         Mood and Affect: Mood is anxious. Affect is labile.        Speech: Speech is tangential.        Behavior: Behavior is cooperative.        Thought Content: Thought content is paranoid and delusional.        Cognition and Memory: Cognition normal.        Judgment: Judgment is inappropriate.     Review of Systems  Constitutional: Negative.   HENT: Negative.   Eyes: Negative.   Respiratory: Negative.   Cardiovascular: Negative.   Gastrointestinal: Negative.   Genitourinary: Negative.   Musculoskeletal: Negative.   Skin: Negative.   Neurological: Negative.   Psychiatric/Behavioral: Positive for hallucinations. The patient is nervous/anxious.     Blood pressure (!) 140/95, pulse 77, temperature 97.7 F (36.5 C), temperature source Oral, resp. rate 18, SpO2 100 %.There is no height or weight on file to calculate BMI.  General Appearance: Casual  Eye Contact:  Good  Speech:  Clear and Coherent  Volume:  Normal  Mood:  Anxious  Affect:  Labile  Thought Process:  Coherent, Goal Directed and Descriptions of Associations: Tangential  Orientation:  Full (Time, Place, and Person)  Thought Content:  Paranoid Ideation  Suicidal Thoughts:  No  Homicidal Thoughts:  No  Memory:  Immediate;   Good Recent;   Good Remote;   Good  Judgement:  Fair  Insight:  Lacking  Psychomotor Activity:  Normal  Concentration:  Concentration: Good and Attention Span: Good  Recall:  Good  Fund of Knowledge:  Good  Language:  Good  Akathisia:  No  Handed:  Right  AIMS (if indicated):     Assets:  Communication Skills Desire for Improvement Financial Resources/Insurance Housing Intimacy Leisure Time Physical Health Resilience Social Support Talents/Skills  ADL's:  Intact  Cognition:  WNL  Sleep:        Treatment Plan Summary: Plan Patient reviewed with Dr. Bronwen Betters.  Inpatient psychiatric treatment recommended. QTc measures 415 on 01/02/2021 Medication Recommendations: - Fluphenazine  10mg  each morning, 30 mg QHS/mood - Trazodone 100mg  QHS/sleep -Depakote 500mg  BID/mood  Disposition: Recommend psychiatric Inpatient admission when medically cleared. Supportive therapy provided about ongoing stressors.  This service was provided via telemedicine using a 2-way, interactive audio and video technology.  Names of all persons participating in this telemedicine service and their role in this encounter. Name: Role: Patient  Name: Role: FNP  Name: Dr. Role: Psychiatry    Sharmon Leyden, FNP 01/03/2021 1:12 PM

## 2021-01-03 NOTE — ED Notes (Signed)
Patient refuses to keep pulse ox or b/p cuff on, took sheets off stretcher, just laying on mattress, however he is calm

## 2021-01-03 NOTE — ED Provider Notes (Signed)
12:22 PM Patient agitated, accusing staff of victory, in spite of staff being a variety of skin colors.  Patient noncooperative, but awake, alert, ambulatory.  As needed Geodon orders are in place.   Ryan Munch, MD 01/03/21 [phone]

## 2021-01-03 NOTE — ED Notes (Signed)
Pt asked to go brush his teeth, patient was in hallway loitering on way to bathroom. Asked patient to go in the bathroom and brush his teeth or go back to his room. Pt stated that writer "is racist, and only likes to boss me around." Patient then walked back to room to start walk to the bathroom over. Pt got back to the bathroom, Pt in the hallway stating "I know yall mad because I am jesus and he ain't white" Pt asked writer if from [place] stating "You must be [place]. Afraid of colored folk. You just aint been broke in yet. You won't last long around here" Patient back in room but continuing to be verbally aggressive towards staff. Security paged, RN aware. Will continue to monitor.

## 2021-01-03 NOTE — ED Notes (Signed)
Dinner tray at bedside

## 2021-01-03 NOTE — ED Provider Notes (Signed)
[facility] EMERGENCY DEPARTMENT Provider Note   CSN: [id number] Arrival date & time: 01/02/21  2254     History Chief Complaint  Patient presents with  . Psychiatric Evaluation    Ryan York is a 50 y.o. male with a history of schizophrenia, bipolar disorder, asthma, and hypertension who presents to the emergency department via [employer] under [name].  Per [name] paperwork patient has not been taking his medications, he has been very aggressive, he is not sleeping or attending to his personal hygiene, and he has been speaking a voices and demons.  Per [employer] patient has been violent, aggressive, swinging at them and threatening to spit on them.  Patient reports some hallucinations, he states that people are really there.  He does not wish to answer any more of my questions.  Level 5 caveat applies secondary to psychiatric condition.  HPI     Past Medical History:  Diagnosis Date  . Asthma   . Bipolar disorder (HCC)   . Hypertension   . Schizophrenia [facility])     Patient Active Problem List   Diagnosis Date Noted  . Asthma 08/05/2016  . Tobacco use disorder 04/04/2016  . Schizoaffective disorder, bipolar type (HCC) 04/20/2012    Past Surgical History:  Procedure Laterality Date  . KNEE SURGERY         Family History  Problem Relation Age of Onset  . Alzheimer's disease Father     Social History   Tobacco Use  . Smoking status: Former Smoker    Packs/day: 0.50    Years: 10.00    Pack years: 5.00    Types: Cigars  . Smokeless tobacco: Never Used  Vaping Use  . Vaping Use: Never used  Substance Use Topics  . Alcohol use: No    Comment: denies alcohol use  . Drug use: No    Comment: UDS not available at time of writing    Home Medications Prior to Admission medications   Medication Sig Start Date End Date Taking? Authorizing Provider  Alpha-D-Galactosidase (BEANO ULTRA 800) TABS Take 1-2 tablets by mouth See admin instructions. Take 1-2 tablets by  mouth before the first bite, or immediately after meals (up to 30 minutes after first bite)    [provider]  Amantadine HCl 100 MG tablet Take 100 mg by mouth 2 (two) times daily. Patient not taking: Reported on 12/02/2020    [provider]  cetirizine (ZYRTEC) 10 MG tablet Take 10 mg by mouth daily. Patient not taking: No sig reported    [provider]  clonazePAM (KLONOPIN) 2 MG tablet Take 2 mg by mouth 2 (two) times daily. Patient not taking: No sig reported    [provider]  diclofenac Sodium (VOLTAREN) 1 % GEL Apply 4 g topically 4 (four) times daily as needed for pain. Patient not taking: Reported on 12/02/2020 10/04/20   [provider]  diphenhydrAMINE HCl (ZZZQUIL) 50 MG/30ML LIQD Take 15-30 mLs by mouth at bedtime as needed (for sleep).    [provider]  divalproex (DEPAKOTE) 500 MG DR tablet Take 1 tablet (500 mg total) by mouth 2 (two) times daily. 12/06/20   Aldean Baker, NP  fluPHENAZine (PROLIXIN) 10 MG tablet Take 1 tablet (10 mg total) by mouth in the morning. 12/07/20   Aldean Baker, NP  ibuprofen (ADVIL,MOTRIN) 400 MG tablet Take 1 tablet (400 mg total) by mouth every 6 (six) hours as needed. Patient not taking: No sig reported  10/13/18   Lorre Nick, MD  metFORMIN (GLUCOPHAGE-XR) 500 MG 24 hr tablet Take 500 mg by mouth in the morning and at bedtime. Patient not taking: Reported on 12/02/2020    [provider]  methocarbamol (ROBAXIN) 500 MG tablet Take 1 tablet (500 mg total) by mouth 2 (two) times daily. Patient not taking: Reported on 12/02/2020 10/13/18   Lorre Nick, MD  montelukast (SINGULAIR) 10 MG tablet Take 10 mg by mouth daily. Patient not taking: Reported on 12/02/2020    [provider]  traZODone (DESYREL) 100 MG tablet Take 1 tablet (100 mg total) by mouth at bedtime. 12/06/20   Aldean Baker, NP    Allergies    Hydroxyzine hcl, Lithium, Risperidone and related, and Vistaril  [hydroxyzine]  Review of Systems   Review of Systems  Unable to perform ROS: Psychiatric disorder    Physical Exam Updated Vital Signs BP (!) 143/95   Pulse 95   Temp 98 F (36.7 C) (Oral)   Resp 16   SpO2 92%   Physical Exam Vitals and nursing note reviewed.  Constitutional:      General: He is not in acute distress.    Appearance: He is well-developed. He is not toxic-appearing.  HENT:     Head: Normocephalic and atraumatic.  Eyes:     General:        Right eye: No discharge.        Left eye: No discharge.     Conjunctiva/sclera: Conjunctivae normal.  Cardiovascular:     Rate and Rhythm: Normal rate and regular rhythm.  Pulmonary:     Effort: Pulmonary effort is normal. No respiratory distress.     Breath sounds: Normal breath sounds. No wheezing, rhonchi or rales.  Abdominal:     General: There is no distension.     Palpations: Abdomen is soft.     Tenderness: There is no abdominal tenderness.  Musculoskeletal:     Cervical back: Neck supple.  Skin:    General: Skin is warm and dry.     Findings: No rash.  Neurological:     Mental Status: He is alert.     Comments: Clear speech.   Psychiatric:        Speech: Speech is rapid and pressured.        Behavior: Behavior is agitated and aggressive.     ED Results / Procedures / Treatments   Labs (all labs ordered are listed, but only abnormal results are displayed) Labs Reviewed  RESP PANEL BY RT-PCR (FLU A&B, COVID) ARPGX2  COMPREHENSIVE METABOLIC PANEL  CBC  ACETAMINOPHEN LEVEL  SALICYLATE LEVEL  RAPID URINE DRUG SCREEN, HOSP PERFORMED  ETHANOL    EKG EKG Interpretation  Date/Time:  Wednesday January 02 2021 23:34:36 EDT Ventricular Rate:  96 PR Interval:  211 QRS Duration: 98 QT Interval:  328 QTC Calculation: 415 R Axis:   -53 Text Interpretation: Sinus rhythm Left axis deviation Confirmed by Nicanor Alcon, April ([id number]) on 01/03/2021 12:02:28 AM   Radiology No results  found.  Procedures .Critical Care Performed by: Cherly Anderson, PA-C Authorized by: Cherly Anderson, PA-C     CRITICAL CARE Performed by: Harvie Heck   Total critical care time: 30 minutes  Critical care time was exclusive of separately billable procedures and treating other patients.  Critical care was necessary to treat or prevent imminent or life-threatening deterioration.  Critical care was time spent personally by me on the following activities: development of treatment plan with  patient and/or surrogate as well as nursing, discussions with consultants, evaluation of patient's response to treatment, examination of patient, obtaining history from patient or surrogate, ordering and performing treatments and interventions, ordering and review of laboratory studies, ordering and review of radiographic studies, pulse oximetry and re-evaluation of patient's condition.   Medications Ordered in ED Medications  sterile water (preservative free) injection (has no administration in time range)  ziprasidone (GEODON) injection 20 mg (20 mg Intramuscular Given 01/02/21 2320)  LORazepam (ATIVAN) injection 2 mg (2 mg Intramuscular Given 01/02/21 2318)  diphenhydrAMINE (BENADRYL) injection 25 mg (25 mg Intramuscular Given 01/02/21 2328)    ED Course  I have reviewed the triage vital signs and the nursing notes.  Pertinent labs & imaging results that were available during my care of the patient were reviewed by me and considered in my medical decision making (see chart for details).    MDM Rules/Calculators/A&P                         Patient presents to the ED under [name] for aggressive behavior, not taking medications or attending to his personal hygiene, and hallucinations with discussion of demons.  On arrival he is agitated, aggressive, not cooperative.  He is attempting to harm police staff shortly prior to arrival and is currently in handcuffs.  When in front of the  patient he has tried spitting on staff.  He is also yelling obscenities.  Will give IM Geodon and IM Ativan with placement of restraints.  Following Geodon and Ativan patient with persistent agitation, IM Benadryl was given per attending.   Additional history obtained:  Additional history obtained from chart review & nursing note review.   Lab Tests:  I Ordered, reviewed, and interpreted labs, which included:  CBC, CMP, acetaminophen level, salicylate level, ethanol level, and Covid/flu testing: Mild anemia/thrombocytopenia as well as mild hypokalemia.  Will order oral potassium.  ED Course:  Patient medically cleared.  Consult placed to TTS.  Disposition per behavioral health.  Findings and plan of care discussed with supervising physician Dr. Nicanor Alcon who has evaluated patient & is in agreement.   Portions of this note were generated with Scientist, clinical (histocompatibility and immunogenetics). Dictation errors may occur despite best attempts at proofreading.  Final Clinical Impression(s) / ED Diagnoses Final diagnoses:  Aggressive behavior    Rx / DC Orders ED Discharge Orders    None       Cherly Anderson, PA-C 01/03/21 0654    Palumbo, April, MD 01/03/21 [phone]

## 2021-01-03 NOTE — ED Notes (Signed)
Pt up walking around room.

## 2021-01-03 NOTE — ED Triage Notes (Signed)
Pt BIB [employer] from brother's house.  PT was [name]'d by brother for psychotic behavior including reports of auditory and visual hallucinations, verbal abuse and aggressive behavior.  Pt had rubbed peanut butter in his hair earlier in the day stating to police that it made him stronger.

## 2021-01-04 MED ORDER — TRAZODONE HCL 100 MG PO TABS
100.0000 mg | ORAL_TABLET | Freq: Every evening | ORAL | Status: DC | PRN
  Administered 2021-01-04 (×2): 100 mg via ORAL
  Filled 2021-01-04 (×2): qty 1

## 2021-01-04 MED ORDER — FLUPHENAZINE HCL 5 MG PO TABS
10.0000 mg | ORAL_TABLET | Freq: Every morning | ORAL | Status: DC
  Administered 2021-01-04 – 2021-01-05 (×2): 10 mg via ORAL
  Filled 2021-01-04 (×2): qty 1
  Filled 2021-01-04 (×3): qty 2
  Filled 2021-01-04: qty 1

## 2021-01-04 NOTE — BHH Counselor (Signed)
TTS reassessment: Patient presents laying in bed with pressured speech. He is pleasant and cooperative during assessment. He states, "I'm trying to keep positive. My brother keeps calling the police out on me. He wants to start fights and when I bite back he wants to call the police." Patient continues to endorse AH, stating he is currently hearing voices. He also continues to appear paranoid as he speaks about concerns of the cell phone towers listening to him in the city. He also expressed concern that dye in his Depakote is hurting him and causing him ulcers. Patient gives verbal consent for TTS to speak with his brother, Harriett Sine at [phone].   This counselor contacted patient's brother (and [name] petitioner), Harriett Sine and updated him on current disposition. This counselor explained he is still recommended for in patient treatment and placement is being sought. Brother was very Adult nurse of update.  Per Dr. Lucianne Muss patient continues to meet in patient care criteria. [facility] reviewing. CSW also to seek placement at an outside facility.

## 2021-01-04 NOTE — ED Notes (Signed)
Face to Face assessment  By TTS this morning .

## 2021-01-04 NOTE — ED Provider Notes (Signed)
  Physical Exam  BP (!) 137/97 (BP Location: Right Arm)   Pulse 75   Temp 98 F (36.7 C) (Oral)   Resp 15   SpO2 100%   Physical Exam  ED Course/Procedures     Procedures  MDM  Patient without episodes at this time.  Reportedly was somewhat agitated yesterday.  Still pending placement.       Benjiman Core, MD 01/04/21 [phone]

## 2021-01-04 NOTE — Progress Notes (Signed)
Patient faxed out following report from Malva Limes, RN and [facility] no appropriate at [facility] at this time   Patient meets inpatient criteria per Doran Heater, NP.    Patient was referred to the following facilities:   Service Provider Address Phone Fax  [facility]  [address] [place] [address] [phone]  [facility]  [address] [place] [address] [phone]  [facility]  [address], [place] [address] [phone]  [facility]  [address], [place] [address] [phone]  [facility]  [address] [place] [address] [phone]  [facility]  [address] [place] [address] [phone]  [facility]  [address] [place], [place] [address] [phone]  [facility]  [address] [place], [place] [address] [phone]  [facility]  [phone] [place] [address] [phone]  [facility]  [address] [place] [address] [phone]  [facility]  [address] [place] [address] [phone]  [facility]  [address], [place] [address] [phone]  [facility]  [address], [place] [address] [phone]      CSW will continue to monitor for disposition.  Penni Homans, MSW, LCSW 01/04/2021 10:57 AM

## 2021-01-04 NOTE — ED Notes (Signed)
Provided pt with a comb.

## 2021-01-04 NOTE — Progress Notes (Signed)
CSW received call from [facility] there are NO beds currently available.  Ryan York, MSW, LCSW 01/04/2021 1:57 PM  

## 2021-01-04 NOTE — ED Notes (Signed)
Patient denies pain and is resting comfortably.  

## 2021-01-05 NOTE — Progress Notes (Signed)
CSW was contacted by Malachi Bonds, RN from [facility] in reference to requesting a copy of the patient's [name] paperwork being send to fax number [phone]. CSW contacted the patient's nurse and provided fax number to send a copy of the [name] paperwork to the fax number provided.   Crissie Reese, MSW, LCSW-A, LCAS-A Phone: [phone] Disposition/TOC

## 2021-01-05 NOTE — ED Provider Notes (Signed)
Emergency Medicine Observation Re-evaluation Note  Ryan York is a 50 y.o. male, seen on rounds today.  Pt initially presented to the ED for complaints of Psychiatric Evaluation Currently, the patient is sitting on bed, calmly.  Physical Exam  BP 113/88 (BP Location: Left Arm)   Pulse 67   Temp 98.2 F (36.8 C)   Resp 18   SpO2 94%  Physical Exam General: Calm Cardiac: Normal heart Lungs: Normal respiratory Psych: Not currently appearing to respond to internal stimuli  ED Course / MDM  EKG:EKG Interpretation  Date/Time:  Wednesday January 02 2021 23:34:36 EDT Ventricular Rate:  96 PR Interval:  211 QRS Duration: 98 QT Interval:  328 QTC Calculation: 415 R Axis:   -53 Text Interpretation: Sinus rhythm Left axis deviation Confirmed by Nicanor Alcon, April ([id number]) on 01/03/2021 12:02:28 AM Also confirmed by Nicanor Alcon, April ([id number]), editor [place]actus, LaVerne [phone])  on 01/04/2021 9:24:58 AM   I have reviewed the labs performed to date as well as medications administered while in observation.  Recent changes in the last 24 hours include accepted at an outlying psychiatric hospital for transfer to complete psychiatric care.  Plan  Current plan is for psychiatric admission. Patient is under full [name] at this time.   Mancel Bale, MD 01/05/21 [phone]

## 2021-01-05 NOTE — Progress Notes (Signed)
Per Malachi Bonds, RN, pt has been accepted to [facility] DDU unit. Accepting provider is Dr. Margarita Rana. Patient can arrive anytime. Number for report is [phone].  Crissie Reese, MSW, LCSW-A Phone: [phone] Disposition/TOC

## 2021-01-05 NOTE — Progress Notes (Signed)
Per Dr. Lucianne Muss, patient meets criteria for inpatient treatment. There are no available or appropriate beds at [facility] today. CSW faxed referrals to the following facilities for review:  [facility] [place]  [facility]  [place] (possible beds) [facility]  TTS will continue to seek bed placement.  Crissie Reese, MSW, LCSW-A, LCAS-A Phone: [phone] Disposition/TOC

## 2021-02-24 ENCOUNTER — Ambulatory Visit (HOSPITAL_COMMUNITY)
Admission: EM | Admit: 2021-02-24 | Discharge: 2021-02-24 | Disposition: A | Discharge status: Home / Self Care | Payer: Medicaid Other | Attending: Family | Admitting: Family

## 2021-02-24 ENCOUNTER — Other Ambulatory Visit: Payer: Self-pay

## 2021-02-24 DIAGNOSIS — F200 Paranoid schizophrenia: Secondary | ICD-10-CM | POA: Insufficient documentation

## 2021-02-24 DIAGNOSIS — Z592 Discord with neighbors, lodgers and landlord: Secondary | ICD-10-CM | POA: Insufficient documentation

## 2021-02-24 DIAGNOSIS — Z733 Stress, not elsewhere classified: Secondary | ICD-10-CM | POA: Diagnosis not present

## 2021-02-24 NOTE — Discharge Summary (Signed)
Ryan York to be D/C'd home per NP order. Discussed with the patient and all questions fully answered. An After Visit Summary was printed and given to the patient. Patient escorted out, and D/C home via private auto.  [name]  02/24/2021 6:36 PM

## 2021-02-24 NOTE — Discharge Instructions (Signed)
Take all medications as prescribed. Keep all follow-up appointments as scheduled.  Do not consume alcohol or use illegal drugs while on prescription medications. Report any adverse effects from your medications to your primary care provider promptly.  In the event of recurrent symptoms or worsening symptoms, call 911, a crisis hotline, or go to the nearest emergency department for evaluation.   

## 2021-02-24 NOTE — ED Provider Notes (Signed)
Behavioral Health Urgent Care Medical Screening Exam  Patient Name: Ryan York MRN: [id number] Date of Evaluation: 02/24/21 Chief Complaint: Chief Complaint/Presenting Problem: Pt states he is still hearing voices and having tactile hallucinations; he wants to go to "Assisted Living or [facility]" Diagnosis:  Final diagnoses:  Paranoid schizophrenia, chronic condition (HCC)    History of Present illness: Ryan York is a 50 y.o. male.  Presents to [facility] urgent care accompanied by [employer].  States he currently resides with his brother however they have not been on speaking terms for the past week.  States is very uncomfortable there and does not want to go back home.  Patient does report he continues to hear voices even though he is taking and tolerating medications well.  [facility].  He denied suicidal or homicidal ideations.  Denies visual hallucinations.   Reported voice are chronic in nature and denied that the voices are command in nature.   [name] is requesting to speak with "Trula Ore" who is on the behavioral health act team with [facility].  Offered additional outpatient resources for housing.  Patient to be discharged.  Support, encouragement and  reassurance was provided.  Psychiatric Specialty Exam  Presentation  General Appearance:Appropriate for Environment  Eye Contact:Good  Speech:Clear and Coherent  Speech Volume:Normal  Handedness:Right   Mood and Affect  Mood:Euthymic  Affect:Congruent   Thought Process  Thought Processes:Coherent  Descriptions of Associations:Intact  Orientation:Full (Time, Place and Person)  Thought Content:Logical  Diagnosis of Schizophrenia or Schizoaffective disorder in past: Yes  Duration of Psychotic Symptoms: Less than six months  Hallucinations:Auditory voice denied that voice are command in nature  Ideas of  Reference:None  Suicidal Thoughts:No  Homicidal Thoughts:No   Sensorium  Memory:Immediate Fair; Recent Fair; Remote Fair  Judgment:Fair  Insight:Fair   Executive Functions  Concentration:Fair  Attention Span:Fair  Recall:Fair  Fund of Knowledge:Good  Language:Fair   Psychomotor Activity  Psychomotor Activity:Normal   Assets  Assets:Housing; Intimacy; Social Support   Sleep  Sleep:Fair  Number of hours: No data recorded  Nutritional Assessment (For OBS and FBC admissions only) Has the patient had a weight loss or gain of 10 pounds or more in the last 3 months?: No Has the patient had a decrease in food intake/or appetite?: No Does the patient have dental problems?: No Does the patient have eating habits or behaviors that may be indicators of an eating disorder including binging or inducing vomiting?: No Has the patient recently lost weight without trying?: No    Physical Exam: Physical Exam Vitals reviewed.  Cardiovascular:     Rate and Rhythm: Normal rate.     Pulses: Normal pulses.     Heart sounds: Normal heart sounds.  Neurological:     Mental Status: He is alert.  Psychiatric:        Attention and Perception: Attention normal.        Mood and Affect: Mood is anxious.        Speech: Speech normal.        Behavior: Behavior normal. Behavior is cooperative.        Thought Content: Thought content normal.        Cognition and Memory: Cognition normal.        Judgment: Judgment normal.    Review of Systems  Psychiatric/Behavioral: Positive for hallucinations. Negative for depression and suicidal ideas. The patient is nervous/anxious.   All other systems  reviewed and are negative.  Blood pressure 129/81, pulse 72, temperature (!) 97.3 F (36.3 C), temperature source Temporal, resp. rate 16, SpO2 98 %. There is no height or weight on file to calculate BMI.  Musculoskeletal: Strength & Muscle Tone: within normal limits Gait & Station:  normal Patient leans: N/A   [facility] MSE Discharge Disposition for Follow up and Recommendations: Based on my evaluation the patient does not appear to have an emergency medical condition and can be discharged with resources and follow up care in outpatient services for Partial Hospitalization Program   Oneta Rack, NP 02/24/2021, 6:38 PM

## 2021-02-24 NOTE — BH Assessment (Signed)
Ryan York is a 50 yo male who presents voluntarily to [facility] for a walk-in assessment. Pt was given a ride to [facility] via [employer]. Pt states he is still hearing voices and having tactile hallucinations x about 20 years. He denies SI and HI.  Pt reports he and his brother have not been getting along for the past week and pt doesn't want to live with brother anymore. Pt states he wants to go to assisted living or [facility]. Pt states he had thoughts of harming his brother (because he exists) but no thoughts to kill him or anyone else. Pt states he is taking his medications but they could be better managing his sx [facility])   Comprehensive Clinical Assessment (CCA) Note  02/24/2021 Ryan York [id number]  Chief Complaint: No chief complaint on file.  Visit Diagnosis: Schizoaffective Disorder Disposition: Hillery Jacks, NP recommends psychiatric clearance  CCA Screening, Triage and Referral (STR)  Patient Reported Information How did you hear about [place]? Self  Referral name: Pt is voluntary- asked [employer] for a ride   Whom do you see for routine medical problems? Primary Care  Practice/Facility Name: Fleet Contras, MD   What Is the Reason for Your Visit/Call Today? Pt states he is still hearing voices and having tactile hallucinations; he wants to go to assisted living or [facility]  How Long Has This Been Causing You Problems? > than 6 months  What Do You Feel Would Help You the Most Today? Treatment for Depression or other mood problem; Housing Assistance   Have You Recently Been in Any Inpatient Treatment (Hospital/Detox/Crisis Center/28-Day Program)? Yes  Name/Location of Program/Hospital:Patient was just seen at the [facility] 12/2020  How Long Were You There? overnight  When Were You Discharged? 12/04/2020   Have You Ever Received Services From [employer] [facility] Before? Yes  Who Do You See at [facility]? Patient has been seen in the ED and [facility] and he has been admitted to [facility] in the past   Have You  Recently Had Any Thoughts About Hurting Yourself? No  Are You Planning to Commit Suicide/Harm Yourself At This time? No   Have you Recently Had Thoughts About Hurting Someone Karolee Ohs? Yes   Have You Used Any Alcohol or Drugs in the Past 24 Hours? No   Do You Currently Have a Therapist/Psychiatrist? Yes  Name of Therapist/Psychiatrist: [facility]   Have You Been Recently Discharged From Any Office Practice or Programs? No     CCA Screening Triage Referral Assessment Type of Contact: Face-to-Face  Is this Initial or Reassessment? Initial Assessment  Date Telepsych consult ordered in [facility]:  01/03/2021  Time Telepsych consult ordered in [facility]:  0139   Patient Reported Information Reviewed? Yes   Collateral Involvement: No collateral provided   Name and Contact of Legal Guardian: -- (patient is his own legal guardian )  If Minor and Not Living with Parent(s), Who has Custody? UTA  Is CPS involved or ever been involved? Never  Is APS involved or ever been involved? Never   Patient Determined To Be At Risk for Harm To Self or Others Based on Review of Patient Reported Information or Presenting Complaint? No  Method: No Plan  Availability of Means: No access or NA  Intent: Vague intent or NA  Notification Required: No need or identified person    Contacted To Inform of Risk of Harm To Self or Others: Other: Comment (none reported)   Location of Assessment: [facility]  Does Patient Present under Involuntary Commitment? No  [name] Papers Initial File Date: 01/02/2021   [place] of Residence: [place]   Patient Currently Receiving the Following Services: Medication Management   Determination of Need: Urgent (48 hours)   Options For Referral: Medication Management; Outpatient Therapy; BH Urgent Care     CCA Biopsychosocial Intake/Chief Complaint:  Pt states he is still hearing voices and having tactile hallucinations; he wants to  go to "Assisted Living or [facility]"  Current Symptoms/Problems: Auditory and tactile hallucinations   Patient Reported Schizophrenia/Schizoaffective Diagnosis in Past: Yes   Strengths: UTA  Preferences: Assisted Living or [facility] referral  Abilities: UTA   Type of Services Patient Feels are Needed: Patient states that he needs assistance with housing   Initial Clinical Notes/Concerns: Angry toward brother- thoughts to hit him but not kill him   Mental Health Symptoms Depression:  None   Duration of Depressive symptoms: No data recorded  Mania:  Racing thoughts; Recklessness; Increased Energy; Irritability; Change in energy/activity   Anxiety:   None   Psychosis:  None   Duration of Psychotic symptoms: Less than six months   Trauma:  None   Obsessions:  None   Compulsions:  None   Inattention:  None   Hyperactivity/Impulsivity:  Difficulty waiting turn; Feeling of restlessness; Fidgets with hands/feet   Oppositional/Defiant Behaviors:  None   Emotional Irregularity:  Intense/inappropriate anger   Other Mood/Personality Symptoms:  No data recorded   Mental Status Exam Appearance and self-care  Stature:  Tall   Weight:  Average weight   Clothing:  Disheveled   Grooming:  Neglected   Cosmetic use:  None   Posture/gait:  Normal   Motor activity:  Not Remarkable   Sensorium  Attention:  Normal   Concentration:  Normal   Orientation:  X5   Recall/memory:  -- (UTA)   Affect and Mood  Affect:  Negative; Appropriate; Anxious   Mood:  Irritable   Relating  Eye contact:  Fleeting; Normal   Facial expression:  Tense   Attitude toward examiner:  Cooperative   Thought and Language  Speech flow: Clear and Coherent   Thought content:  Appropriate to Mood and Circumstances   Preoccupation:  None   Hallucinations:  Auditory; Tactile   Organization:  No data recorded  [employer] of Knowledge:  Fair   Intelligence:  Average    Abstraction:  Functional   Judgement:  Fair   Dance movement psychotherapist:  Distorted   Insight:  Gaps   Decision Making:  Vacilates   Social Functioning  Social Maturity:  Irresponsible; Isolates   Social Judgement:  Impropriety   Stress  Stressors:  Family conflict; Legal   Coping Ability:  Deficient supports   Skill Deficits:  Self-control; Activities of daily living; Decision making; Interpersonal; Responsibility; Self-care   Supports:  Support needed     Leisure/Recreation: Leisure / Recreation Do You Have Hobbies?: No  Exercise/Diet: Exercise/Diet Do You Exercise?: No Have You Gained or Lost A Significant Amount of Weight in the Past Six Months?: No Do You Follow a Special Diet?: No Do You Have Any Trouble Sleeping?: No   CCA Employment/Education Employment/Work Situation: Employment / Work Situation Employment situation: On disability Why is patient on disability: UTA How long has patient been on disability: UTA Patient's job has been impacted by current illness: No What is the longest time patient has a held a job?: UTA Where was the patient employed at that time?: UTA Has patient ever  been in the [employer]?: No  Education: Education Is Patient Currently Attending School?: No Last Grade Completed: 12 Name of High School: [employer] Did You Graduate From [employer]?: Yes Did You Attend College?: No Did You Attend Graduate School?: No Did You Have Any Special Interests In School?: UTA Did You Have An Individualized Education Program (IIEP): No Did You Have Any Difficulty At School?: No Patient's Education Has Been Impacted by Current Illness: No   CCA Family/Childhood History Family and Relationship History: Family history Marital status: Single Are you sexually active?: No What is your sexual orientation?: Heterosexual Has your sexual activity been affected by drugs, alcohol, medication, or emotional stress?: UTA Does patient have children?:  No  Childhood History:  Childhood History By whom was/is the patient raised?: Mother Additional childhood history information: Pt reports that his mother died, "I am glad she is dead, she kept me jail" Description of patient's relationship with caregiver when they were a child: negative How were you disciplined when you got in trouble as a child/adolescent?: UTA Did patient suffer any verbal/emotional/physical/sexual abuse as a child?: No Has patient ever been sexually abused/assaulted/raped as an adolescent or adult?: No Witnessed domestic violence?: No Has patient been affected by domestic violence as an adult?: No   CCA Substance Use Alcohol/Drug Use: Alcohol / Drug Use Pain Medications: See MAR Prescriptions: See MAR- Pt states he is taking his medications but they could be better managing his sx (AH) Over the Counter: See MAR History of alcohol / drug use?: No history of alcohol / drug abuse Longest period of sobriety (when/how long): unknown      DSM5 Diagnoses: Patient Active Problem List   Diagnosis Date Noted  . Disorganized schizophrenia (HCC)   . Asthma 08/05/2016  . Tobacco use disorder 04/04/2016  . Schizoaffective disorder, bipolar type (HCC) 04/20/2012    Patient Centered Plan: Patient is on the following Treatment Plan(s):  Impulse Control   Jaime Grizzell Suzan Nailer, LCSW

## 2021-12-28 ENCOUNTER — Emergency Department (HOSPITAL_COMMUNITY)
Admission: EM | Admit: 2021-12-28 | Discharge: 2021-12-29 | Disposition: A | Discharge status: Home / Self Care | Payer: Medicaid Other | Attending: Emergency Medicine | Admitting: Emergency Medicine

## 2021-12-28 DIAGNOSIS — M545 Low back pain, unspecified: Secondary | ICD-10-CM | POA: Insufficient documentation

## 2021-12-29 ENCOUNTER — Other Ambulatory Visit: Payer: Self-pay

## 2021-12-29 ENCOUNTER — Encounter (HOSPITAL_COMMUNITY): Payer: Self-pay | Admitting: Emergency Medicine

## 2021-12-29 MED ORDER — METHOCARBAMOL 500 MG PO TABS
500.0000 mg | ORAL_TABLET | Freq: Once | ORAL | Status: AC
  Administered 2021-12-29: 500 mg via ORAL
  Filled 2021-12-29: qty 1

## 2021-12-29 MED ORDER — METHOCARBAMOL 500 MG PO TABS: 500.0000 mg | ORAL_TABLET | Freq: Two times a day (BID) | ORAL | 0 refills | Status: AC | PRN

## 2021-12-29 MED ORDER — OXYCODONE-ACETAMINOPHEN 5-325 MG PO TABS
1.0000 | ORAL_TABLET | Freq: Once | ORAL | Status: AC
  Administered 2021-12-29: 1 via ORAL
  Filled 2021-12-29: qty 1

## 2021-12-29 MED ORDER — METHOCARBAMOL 500 MG PO TABS: 500.0000 mg | ORAL_TABLET | Freq: Two times a day (BID) | ORAL | 0 refills | Status: DC | PRN

## 2021-12-29 NOTE — ED Provider Notes (Signed)
?[facility]-EMERGENCY DEPT ?Provider Note ? ? ?CSN: [id number] ?Arrival date & time: 12/28/21  2355 ? ?  ? ?History ? ?Chief Complaint  ?Patient presents with  ? Back Pain  ? ? ?Ryan York is a 51 y.o. male. ? ?HPI ?Patient is a 51 year old male with a history of schizoaffective disorder who presents to the emergency department due to low back pain.  Patient brought in by EMS for his symptoms.  States that they began 2 days ago while sitting on the couch.  States his symptoms have progressively worsened.  States that they worsen significantly with movement as well as ambulation.  Patient has not taken any OTC medications at home.  Denies any falls or trauma.  Reports a history of similar symptoms but not of this severity.  Denies any chest pain, shortness of breath, abdominal pain, fevers, chills, unintentional weight loss, night sweats, bowel/bladder incontinence, numbness, weakness, dysuria, urinary frequency. ?  ? ?Home Medications ?Prior to Admission medications   ?Medication Sig Start Date End Date Taking? Authorizing Provider  ?cetirizine (ZYRTEC) 10 MG tablet Take 10 mg by mouth daily as needed for allergies. 07/23/21  Yes [provider]  ?fluPHENAZine (PROLIXIN) 10 MG tablet Take 1 tablet (10 mg total) by mouth in the morning. 12/07/20  Yes Aldean Baker, NP  ?methocarbamol (ROBAXIN) 500 MG tablet Take 1 tablet (500 mg total) by mouth 2 (two) times daily as needed for muscle spasms. 12/29/21  Yes Placido Sou, PA-C  ?QUETIAPINE FUMARATE PO Take 1 tablet by mouth at bedtime.   Yes [provider]  ?divalproex (DEPAKOTE) 500 MG DR tablet Take 1 tablet (500 mg total) by mouth 2 (two) times daily. ?Patient not taking: Reported on 12/29/2021 12/06/20   Aldean Baker, NP  ?traZODone (DESYREL) 100 MG tablet Take 1 tablet (100 mg total) by mouth at bedtime. ?Patient not taking: Reported on 12/29/2021 12/06/20   Aldean Baker, NP  ?   ? ?Allergies    ?Hydroxyzine hcl,  Lithium, Risperidone and related, and Vistaril [hydroxyzine]   ? ?Review of Systems   ?Review of Systems  ?All other systems reviewed and are negative. ?Ten systems reviewed and are negative for acute change, except as noted in the HPI.   ?Physical Exam ?Updated Vital Signs ?BP (!) 143/102   Pulse 61   Temp 98.1 ?F (36.7 ?C) (Oral)   Resp 16   Ht 6' (1.829 m)   Wt 106.6 kg   SpO2 99%   BMI 31.87 kg/m?  ?Physical Exam ?Vitals and nursing note reviewed.  ?Constitutional:   ?   General: He is not in acute distress. ?   Appearance: Normal appearance. He is not ill-appearing, toxic-appearing or diaphoretic.  ?HENT:  ?   Head: Normocephalic and atraumatic.  ?   Right Ear: External ear normal.  ?   Left Ear: External ear normal.  ?   Nose: Nose normal.  ?   Mouth/Throat:  ?   Mouth: Mucous membranes are moist.  ?   Pharynx: Oropharynx is clear. No oropharyngeal exudate or posterior oropharyngeal erythema.  ?Eyes:  ?   Extraocular Movements: Extraocular movements intact.  ?Cardiovascular:  ?   Rate and Rhythm: Normal rate.  ?   Pulses: Normal pulses.  ?Pulmonary:  ?   Effort: Pulmonary effort is normal.  ?Abdominal:  ?   General: Abdomen is flat.  ?   Palpations: Abdomen is soft.  ?   Tenderness: There is no abdominal  tenderness.  ?   Comments: Abdomen is soft and nontender in all 4 quadrants.  ?Musculoskeletal:     ?   General: Normal range of motion.  ?   Cervical back: Normal range of motion and neck supple. No tenderness.  ?   Comments: No tenderness noted to the C, T, or L-spine.  No step-offs, crepitus, or deformities.  No palpable tenderness noted along the bilateral flanks or bilateral lumbar musculature.  Full range of motion of the bilateral hips, knees, and ankles.  Distal sensation intact in the bilateral lower extremities.  2+ DP pulses.  Strength is 5/5 with plantar/dorsiflexion of the feet.  ?Skin: ?   General: Skin is warm and dry.  ?Neurological:  ?   General: No focal deficit present.  ?   Mental  Status: He is alert and oriented to person, place, and time.  ?Psychiatric:     ?   Mood and Affect: Mood normal.     ?   Behavior: Behavior normal.  ? ? ?ED Results / Procedures / Treatments   ?Labs ?(all labs ordered are listed, but only abnormal results are displayed) ?Labs Reviewed - No data to display ? ?EKG ?None ? ?Radiology ?No results found. ? ?Procedures ?Procedures  ? ?Medications Ordered in ED ?Medications  ?methocarbamol (ROBAXIN) tablet 500 mg (has no administration in time range)  ?oxyCODONE-acetaminophen (PERCOCET/ROXICET) 5-325 MG per tablet 1 tablet (1 tablet Oral Given 12/29/21 0038)  ? ?ED Course/ Medical Decision Making/ A&P ?  ?                        ?Medical Decision Making ?Risk ?Prescription drug management. ? ?Pt is a 51 y.o. male who presents to the emergency department due to low back pain for the past 2 days.  States that he felt his back tighten up 2 nights ago and has symptoms have persisted.  They worsen when standing and ambulating.  His pain is nonradiating.  No numbness, weakness, fevers, unintentional weight loss, bowel/bladder incontinence. ? ?On my exam strength is 5/5 in the lower extremities.  Distal sensation intact.  Palpable pedal pulses.  Abdomen is soft and nontender.  No palpable tenderness noted along the midline spine or the bilateral lumbar musculature.  Patient's pain appears to be reproducible when moving from a sitting to a standing position.  States it is a "pulling sensation".  Appears to likely be muscular.  Denies any numbness, weakness, bowel/bladder incontinence.  Does not appear consistent with cauda equina.   ? ?Patient given a dose of Percocet and Robaxin and reports moderate improvement in his symptoms.  Appears stable for discharge at this time he is agreeable.  He has a ride home.  We discussed return precautions.  Will discharge on short course of Robaxin.  We discussed safety regarding this medication.  His questions were answered and he was amicable  at the time of discharge. ? ?Note: Portions of this report may have been transcribed using voice recognition software. Every effort was made to ensure accuracy; however, inadvertent computerized transcription errors may be present.  ? ?Final Clinical Impression(s) / ED Diagnoses ?Final diagnoses:  ?Acute low back pain without sciatica, unspecified back pain laterality  ? ?Rx / DC Orders ?ED Discharge Orders   ? ?      Ordered  ?  methocarbamol (ROBAXIN) 500 MG tablet  2 times daily PRN       ? 12/29/21 0200  ? ?  ?  ? ?  ? ? ?  ?  Placido SouJoldersma, Nizhoni Parlow, PA-C ?12/29/21 0209 ? ?  ?Geoffery Lyonselo, [place], MD ?12/29/21 0430 ? ?

## 2021-12-29 NOTE — Discharge Instructions (Addendum)
I am prescribing you a strong muscle relaxer called Robaxin.  You can take this up to 2 times a day for management of your pain.  This medication can be sedating so do not mix it with alcohol.  Do not drive a car after taking it. ? ?Please follow-up with your regular doctor regarding your symptoms as well as this visit today.  If you develop any new or worsening symptoms please come back to the emergency department. ?

## 2021-12-29 NOTE — ED Triage Notes (Signed)
?  Patient BIB EMS for lower back pain that has been going on for two days.  Patient states his lower back tightened up on Thursday night and he has only been able to lay on the couch and crawl around.  No OTC meds at home.  Patient states the pain does not shoot down legs, or go up his back.  Pain is stationary to lower back and hips.  No numbness, or tingling in extremities.  Patient states he has hurt his back before but never this bad.  Pain 10/10, sharp stabbing pain. ?

## 2022-03-13 ENCOUNTER — Other Ambulatory Visit: Payer: Self-pay

## 2022-03-13 ENCOUNTER — Emergency Department (HOSPITAL_COMMUNITY)
Admission: EM | Admit: 2022-03-13 | Discharge: 2022-03-13 | Disposition: A | Discharge status: Home / Self Care | Payer: Medicaid Other | Attending: Emergency Medicine | Admitting: Emergency Medicine

## 2022-03-13 ENCOUNTER — Encounter (HOSPITAL_COMMUNITY): Payer: Self-pay | Admitting: Emergency Medicine

## 2022-03-13 DIAGNOSIS — J45909 Unspecified asthma, uncomplicated: Secondary | ICD-10-CM | POA: Diagnosis not present

## 2022-03-13 DIAGNOSIS — I10 Essential (primary) hypertension: Secondary | ICD-10-CM | POA: Diagnosis not present

## 2022-03-13 DIAGNOSIS — F32A Depression, unspecified: Secondary | ICD-10-CM | POA: Diagnosis present

## 2022-03-13 DIAGNOSIS — R45851 Suicidal ideations: Secondary | ICD-10-CM | POA: Diagnosis not present

## 2022-03-13 DIAGNOSIS — F250 Schizoaffective disorder, bipolar type: Secondary | ICD-10-CM | POA: Diagnosis present

## 2022-03-13 DIAGNOSIS — F201 Disorganized schizophrenia: Secondary | ICD-10-CM | POA: Diagnosis present

## 2022-03-13 LAB — CBC
HCT: 41.8 % (ref 39.0–52.0)
Hemoglobin: 14.6 g/dL (ref 13.0–17.0)
MCH: 32.2 pg (ref 26.0–34.0)
MCHC: 34.9 g/dL (ref 30.0–36.0)
MCV: 92.1 fL (ref 80.0–100.0)
Platelets: 155 10*3/uL (ref 150–400)
RBC: 4.54 MIL/uL (ref 4.22–5.81)
RDW: 13 % (ref 11.5–15.5)
WBC: 6.8 10*3/uL (ref 4.0–10.5)
nRBC: 0 % (ref 0.0–0.2)

## 2022-03-13 LAB — COMPREHENSIVE METABOLIC PANEL
ALT: 21 U/L (ref 0–44)
AST: 26 U/L (ref 15–41)
Albumin: 4.4 g/dL (ref 3.5–5.0)
Alkaline Phosphatase: 71 U/L (ref 38–126)
Anion gap: 9 (ref 5–15)
BUN: 11 mg/dL (ref 6–20)
CO2: 22 mmol/L (ref 22–32)
Calcium: 9 mg/dL (ref 8.9–10.3)
Chloride: 109 mmol/L (ref 98–111)
Creatinine, Ser: 1.13 mg/dL (ref 0.61–1.24)
GFR, Estimated: >60 mL/min (ref >60)
Glucose, Bld: 124 mg/dL — ABNORMAL HIGH (ref 70–99)
Potassium: 3.4 mmol/L — ABNORMAL LOW (ref 3.5–5.1)
Sodium: 140 mmol/L (ref 135–145)
Total Bilirubin: 0.6 mg/dL (ref 0.3–1.2)
Total Protein: 8.5 g/dL — ABNORMAL HIGH (ref 6.5–8.1)

## 2022-03-13 LAB — SALICYLATE LEVEL: Salicylate Lvl: <7 mg/dL — ABNORMAL LOW (ref 7.0–30.0)

## 2022-03-13 LAB — ETHANOL: Alcohol, Ethyl (B): <10 mg/dL (ref <10)

## 2022-03-13 LAB — RAPID URINE DRUG SCREEN, HOSP PERFORMED
Amphetamines: NOT DETECTED
Barbiturates: NOT DETECTED
Benzodiazepines: NOT DETECTED
Cocaine: NOT DETECTED
Opiates: NOT DETECTED
Tetrahydrocannabinol: NOT DETECTED

## 2022-03-13 LAB — ACETAMINOPHEN LEVEL: Acetaminophen (Tylenol), Serum: <10 ug/mL — ABNORMAL LOW (ref 10–30)

## 2022-03-13 MED ORDER — TRAZODONE HCL 100 MG PO TABS: 100.0000 mg | ORAL_TABLET | Freq: Every day | ORAL | Status: DC

## 2022-03-13 MED ORDER — FLUPHENAZINE HCL 5 MG PO TABS
10.0000 mg | ORAL_TABLET | Freq: Every day | ORAL | Status: DC
  Administered 2022-03-13: 10 mg via ORAL
  Filled 2022-03-13: qty 2

## 2022-03-13 NOTE — ED Notes (Signed)
Belongings given to patient, patient to be discharged to wait for his brother who is about 15 minutes away. AVS provided to and discussed with patient. Pt verbalizes understanding of discharge instructions and denies any questions or concerns at this time. Pt has ride home. Pt ambulated out of department independently with steady gait.

## 2022-03-13 NOTE — BH Assessment (Signed)
Aggie NP to see and assess. No TTS CCA needed at this time per Dr. Kumar in 9 am meeting today.  

## 2022-03-13 NOTE — ED Notes (Signed)
Pt belongings placed in 2 white pt belongings bags along with a green backpack. Pt wanded by security

## 2022-03-13 NOTE — ED Triage Notes (Addendum)
  Patient BIB police for suicidal ideation.  Patient states he has thought about killing himself off and on for a few weeks.  Patient denies trying to harm himself with pills or weapons, but did try to choke himself.  Patient states he has been having "blacking out" spots in his memory.  No pain at this time. Denies any drug/alcohol use.

## 2022-03-13 NOTE — ED Notes (Signed)
TTS at bedside assessing pt on screen.

## 2022-03-13 NOTE — ED Provider Notes (Addendum)
[facility]-EMERGENCY DEPT Provider Note   CSN: [id number] Arrival date & time: 03/13/22  0049     History  Chief Complaint  Patient presents with   Suicidal    Ryan York is a 51 y.o. male.  HPI  Patient has history of bipolar disorder, hypertension, asthma, schizophrenia.  Patient presents to the ED with complaints of suicidal ideation.  Patient states he has had these thoughts for the last couple of weeks.  He states he has been thinking about overdosing on pills.  He states he has been compliant with his mental health medications.  He denies any medical complaints, chest pain shortness of breath concern for  Home Medications Prior to Admission medications   Medication Sig Start Date End Date Taking? Authorizing Provider  fluPHENAZine (PROLIXIN) 10 MG tablet Take 1 tablet (10 mg total) by mouth in the morning. 12/07/20  Yes Aldean Baker, NP  QUETIAPINE FUMARATE PO Take 1 tablet by mouth at bedtime.   Yes [provider]  traZODone (DESYREL) 100 MG tablet Take 1 tablet (100 mg total) by mouth at bedtime. 12/06/20  Yes Aldean Baker, NP  cetirizine (ZYRTEC) 10 MG tablet Take 10 mg by mouth daily as needed for allergies. Patient not taking: Reported on 03/13/2022 07/23/21   [provider]  divalproex (DEPAKOTE) 500 MG DR tablet Take 1 tablet (500 mg total) by mouth 2 (two) times daily. Patient not taking: Reported on 12/29/2021 12/06/20   Aldean Baker, NP  methocarbamol (ROBAXIN) 500 MG tablet Take 1 tablet (500 mg total) by mouth 2 (two) times daily as needed for muscle spasms. Patient not taking: Reported on 03/13/2022 12/29/21   Placido Sou, PA-C      Allergies    Hydroxyzine hcl, Lithium, Risperidone and related, and Vistaril [hydroxyzine]    Review of Systems   Review of Systems  Constitutional:  Negative for fever.    Physical Exam Updated Vital Signs BP 121/84 (BP Location: Left Arm)   Pulse 98   Temp 97.7 F (36.5  C) (Oral)   Resp 18   SpO2 93%  Physical Exam Vitals and nursing note reviewed.  Constitutional:      General: He is not in acute distress.    Appearance: He is well-developed.  HENT:     Head: Normocephalic and atraumatic.     Right Ear: External ear normal.     Left Ear: External ear normal.  Eyes:     General: No scleral icterus.       Right eye: No discharge.        Left eye: No discharge.     Conjunctiva/sclera: Conjunctivae normal.  Neck:     Trachea: No tracheal deviation.  Cardiovascular:     Rate and Rhythm: Normal rate and regular rhythm.  Pulmonary:     Effort: Pulmonary effort is normal. No respiratory distress.     Breath sounds: Normal breath sounds. No stridor. No wheezing or rales.  Abdominal:     General: Bowel sounds are normal. There is no distension.     Palpations: Abdomen is soft.     Tenderness: There is no abdominal tenderness. There is no guarding or rebound.  Musculoskeletal:        General: No tenderness or deformity.     Cervical back: Neck supple.  Skin:    General: Skin is warm and dry.     Findings: No rash.  Neurological:     General: No focal  deficit present.     Mental Status: He is alert.     Cranial Nerves: No cranial nerve deficit (no facial droop, extraocular movements intact, no slurred speech).     Sensory: No sensory deficit.     Motor: No abnormal muscle tone or seizure activity.     Coordination: Coordination normal.  Psychiatric:        Mood and Affect: Mood is depressed. Affect is not tearful.        Speech: Speech is not slurred or tangential.        Behavior: Behavior is not aggressive, hyperactive or combative.        Thought Content: Thought content includes suicidal ideation.     ED Results / Procedures / Treatments   Labs (all labs ordered are listed, but only abnormal results are displayed) Labs Reviewed  COMPREHENSIVE METABOLIC PANEL - Abnormal; Notable for the following components:      Result Value    Potassium 3.4 (*)    Glucose, Bld 124 (*)    Total Protein 8.5 (*)    All other components within normal limits  SALICYLATE LEVEL - Abnormal; Notable for the following components:   Salicylate Lvl <7.0 (*)    All other components within normal limits  ACETAMINOPHEN LEVEL - Abnormal; Notable for the following components:   Acetaminophen (Tylenol), Serum <10 (*)    All other components within normal limits  RESP PANEL BY RT-PCR (FLU A&B, COVID) ARPGX2  ETHANOL  CBC  RAPID URINE DRUG SCREEN, HOSP PERFORMED    EKG EKG Interpretation  Date/Time:  Thursday March 13 2022 01:17:37 EDT Ventricular Rate:  93 PR Interval:  161 QRS Duration: 93 QT Interval:  412 QTC Calculation: 513 R Axis:   -60 Text Interpretation: Sinus rhythm LAD, consider left anterior fascicular block RSR' in V1 or V2, probably normal variant Prolonged QT interval Confirmed by Linwood Dibbles [phone]) on 03/13/2022 7:32:59 AM  Radiology No results found.  Procedures Procedures    Medications Ordered in ED Medications  traZODone (DESYREL) tablet 100 mg (has no administration in time range)  fluPHENAZine (PROLIXIN) tablet 10 mg (has no administration in time range)    ED Course/ Medical Decision Making/ A&P Clinical Course as of 03/13/22 0736  Thu Mar 13, 2022  0732 Labs reviewed.  No significant abnormalities.  CBC and metabolic panel unremarkable.  Negative UDS.  Negative EtOH [JK]    Clinical Course User Index [JK] Linwood Dibbles, MD                           Medical Decision Making Amount and/or Complexity of Data Reviewed External Data Reviewed: notes.    Details: Previous mental health visits reviewed Labs: ordered. Decision-making details documented in ED Course.  Risk Prescription drug management. Diagnosis or treatment significantly limited by social determinants of health.   Patient presented to ED with complaints of suicidal ideation.  No acute medical issues noted.  Patient is medically cleared  for psychiatric evaluation  The patient has been placed in psychiatric observation due to the need to provide a safe environment for the patient while obtaining psychiatric consultation and evaluation, as well as ongoing medical and medication management to treat the patient's condition.  The patient has not been placed under full [name] at this time.         Final Clinical Impression(s) / ED Diagnoses Final diagnoses:  Suicidal ideation    Rx / DC Orders ED  Discharge Orders     None         Linwood Dibbles, MD 03/13/22 [phone] Patient was assessed by the psychiatry team.  He is stable for discharge and outpatient management and does not require inpatient treatment   Linwood Dibbles, MD 03/13/22 1417

## 2022-03-13 NOTE — ED Notes (Signed)
Dr. Knapp at bedside assessing pt at this time.  °

## 2022-03-13 NOTE — Consult Note (Signed)
[facility] ED ASSESSMENT   Reason for Consult: Treatment recommendation. Referring Physician: [facility] ED[facility].  Patient Identification: Ryan York  MRN:  [id number]  ED Chief Complaint: Schizoaffective disorder, bipolar type [facility](HCC)   Diagnosis:  Principal Problem:   Schizoaffective disorder, bipolar type (HCC) Active Problems:   Disorganized schizophrenia [facility](HCC)   ED Assessment Time Calculation: No data recorded  Subjective:   Ryan Primerremayne G Hann is a 51 y.o. AA male with hx of mental illness, chronic. Patient was [facility] at [facility] ED initially with complain of suicidal ideations with plans to overdose on medication. However, during this telepsych evaluation with this provider, Orinda Kennerremayne reports, "The police brought me here last night. I called them. I was having suicidal thoughts because a lot is going on. My apartment that I live in is in-humane & my washer & dryer broke down. I did not know what to do, so I became suicidal. I have schizophrenia since 1995. I have an Act team from [facility]. They visit me once or twice a week. I saw them last week. I'm on a Prolixin injection on a biweekly basis. I received the shot last week Tuesday. I also take Prolixin pill, Seroquel & Trazodone. I am still hearing voices telling me to go sell drugs. I ain't selling no drugs. I just need help with my apartment & my washer/dryer fixed". Robert at this time denies any SIHI, plans or intent. He does not appear to be responding to any internal stimuli. He is endorsing auditory hallucinations telling his to sell drugs. He denies any VH. Patient at this time does not meet criteria for inpatient admission. Informed & instructed patient that the problem with his apartment/broken washer/dryer are not problems that the EDP can fix for him. Rather, these problems need to be addressed properly with his act team for adequate response. He will be discharged to continue mental health care/other  services with his Act team. This case is discussed with Dr. Lucianne MussKumar & she is in agreement with the discharge decision/recommendation.[name].  HPI: (Per the EDP evaluation notes. Reviewed & agreed with this notes): Patient has history of bipolar disorder, hypertension, asthma, schizophrenia.  Patient presents to the ED with complaints of suicidal ideation.  Patient states he has had these thoughts for the last couple of weeks.  He states he has been thinking about overdosing on pills.  He states he has been compliant with his mental health medications.  He denies any medical complaints, chest pain shortness of breath concern for  Past Psychiatric History: Schizoaffective disorder.  Risk to Self or Others: Is the patient at risk to self? No Has the patient been a risk to self in the past 6 months? Yes Has the patient been a risk to self within the distant past? Yes Is the patient a risk to others? No Has the patient been a risk to others in the past 6 months? No Has the patient been a risk to others within the distant past? No  [place] Scale:  Flowsheet Row ED from 03/13/2022 [facility] from [facility]2021 [facility] from [facility]02[facility]2021 [facility]  C-SSRS RISK CATEGORY High Risk No Risk Error: Q3, 4, or 5 should not be populated when Q2 is No       AIMS:  , , ,  ,   ASAM:    Substance Abuse:     Past Medical History:  Past  Medical History:  Diagnosis Date   Asthma    Bipolar disorder (HCC)    Hypertension    Schizophrenia (HCC)     Past Surgical History:  Procedure Laterality Date   KNEE SURGERY     Family History:  Family History  Problem Relation Age of Onset   Alzheimer's disease Father    Family Psychiatric  History: Reports, "I'm not sure".  Social History:  Social History   Substance and Sexual Activity  Alcohol Use No   Comment: denies alcohol use      Social History   Substance and Sexual Activity  Drug Use No   Comment: UDS not available at time of writing    Social History   Socioeconomic History   Marital status: Single    Spouse name: Not on file   Number of children: Not on file   Years of education: Not on file   Highest education level: Not on file  Occupational History   Not on file  Tobacco Use   Smoking status: Former    Packs/day: 0.50    Years: 10.00    Total pack years: 5.00    Types: Cigars, Cigarettes   Smokeless tobacco: Never  Vaping Use   Vaping Use: Never used  Substance and Sexual Activity   Alcohol use: No    Comment: denies alcohol use   Drug use: No    Comment: UDS not available at time of writing   Sexual activity: Not on file  Other Topics Concern   Not on file  Social History Narrative   Not on file   Social Determinants of Health   Financial Resource Strain: Not on file  Food Insecurity: Not on file  Transportation Needs: Not on file  Physical Activity: Not on file  Stress: Not on file  Social Connections: Not on file   Additional Social History:  Allergies:   Allergies  Allergen Reactions   Hydroxyzine Hcl Other (See Comments)    "Sexual difficulty"- Note: "Vistaril" has been in Epic since 2014 as "nausea and vomiting"/allergic reaction and "Hydroxyzine" has been in since 2019 with an intolerance of "sexual difficulty"   Lithium Itching   Risperidone And Related Other (See Comments)    "It shrinks my penis."    Vistaril [Hydroxyzine] Nausea And Vomiting and Other (See Comments)    "Vistaril" has been in Epic since 2014 as "nausea and vomiting"/allergic reaction and "Hydroxyzine" has been in since 2019 with an intolerance of "sexual difficulty"   Labs:  Results for orders placed or performed during the hospital encounter of 03/13/22 (from the past 48 hour(s))  Comprehensive metabolic panel     Status: Abnormal   Collection Time: 03/13/22  1:29 AM  Result Value Ref Range    Sodium 140 135 - 145 mmol/L   Potassium 3.4 (L) 3.5 - 5.1 mmol/L   Chloride 109 98 - 111 mmol/L   CO2 22 22 - 32 mmol/L   Glucose, Bld 124 (H) 70 - 99 mg/dL    Comment: Glucose reference range applies only to samples taken after fasting for at least 8 hours.   BUN 11 6 - 20 mg/dL   Creatinine, Ser 2.20 0.61 - 1.24 mg/dL   Calcium 9.0 8.9 - 25.4 mg/dL   Total Protein 8.5 (H) 6.5 - 8.1 g/dL   Albumin 4.4 3.5 - 5.0 g/dL   AST 26 15 - 41 U/L   ALT 21 0 - 44 U/L   Alkaline Phosphatase 71 38 -  126 U/L   Total Bilirubin 0.6 0.3 - 1.2 mg/dL   GFR, Estimated >54 >65 mL/min    Comment: (NOTE) Calculated using the CKD-EPI Creatinine Equation (2021)    Anion gap 9 5 - 15    Comment: Performed at [facility], [address] [place], [place] [address]  Ethanol     Status: None   Collection Time: 03/13/22  1:29 AM  Result Value Ref Range   Alcohol, Ethyl (B) <10 <10 mg/dL    Comment: (NOTE) Lowest detectable limit for serum alcohol is 10 mg/dL.  For medical purposes only. Performed at [facility], [address], [place], [place] [address]   Salicylate level     Status: Abnormal   Collection Time: 03/13/22  1:29 AM  Result Value Ref Range   Salicylate Lvl <7.0 (L) 7.0 - 30.0 mg/dL    Comment: Performed at [facility], [address] [place], [place] [address]  Acetaminophen level     Status: Abnormal   Collection Time: 03/13/22  1:29 AM  Result Value Ref Range   Acetaminophen (Tylenol), Serum <10 (L) 10 - 30 ug/mL    Comment: (NOTE) Therapeutic concentrations vary significantly. A range of 10-30 ug/mL  may be an effective concentration for many patients. However, some  are best treated at concentrations outside of this range. Acetaminophen concentrations >150 ug/mL at 4 hours after ingestion  and >50 ug/mL at 12 hours after ingestion are often associated with  toxic reactions.  Performed at [facility],  [address] [place], [place] [address]   cbc     Status: None   Collection Time: 03/13/22  1:29 AM  Result Value Ref Range   WBC 6.8 4.0 - 10.5 K/uL   RBC 4.54 4.22 - 5.81 MIL/uL   Hemoglobin 14.6 13.0 - 17.0 g/dL   HCT 91.6 38.4 - 66.5 %   MCV 92.1 80.0 - 100.0 fL   MCH 32.2 26.0 - 34.0 pg   MCHC 34.9 30.0 - 36.0 g/dL   RDW 99.3 57.0 - 17.7 %   Platelets 155 150 - 400 K/uL   nRBC 0.0 0.0 - 0.2 %    Comment: Performed at [facility], [address], [place], [place] [address]  Rapid urine drug screen (hospital performed)     Status: None   Collection Time: 03/13/22  2:02 AM  Result Value Ref Range   Opiates NONE DETECTED NONE DETECTED   Cocaine NONE DETECTED NONE DETECTED   Benzodiazepines NONE DETECTED NONE DETECTED   Amphetamines NONE DETECTED NONE DETECTED   Tetrahydrocannabinol NONE DETECTED NONE DETECTED   Barbiturates NONE DETECTED NONE DETECTED    Comment: (NOTE) DRUG SCREEN FOR MEDICAL PURPOSES ONLY.  IF CONFIRMATION IS NEEDED FOR ANY PURPOSE, NOTIFY LAB WITHIN 5 DAYS.  LOWEST DETECTABLE LIMITS FOR URINE DRUG SCREEN Drug Class                     Cutoff (ng/mL) Amphetamine and metabolites    1000 Barbiturate and metabolites    200 Benzodiazepine                 200 Tricyclics and metabolites     300 Opiates and metabolites        300 Cocaine and metabolites        300 THC  50 Performed at [facility], [address], [place], [place] [address]     Current Facility-Administered Medications  Medication Dose Route Frequency Provider Last Rate Last Admin   fluPHENAZine (PROLIXIN) tablet 10 mg  10 mg Oral Daily Linwood Dibbles, MD   10 mg at 03/13/22 6045   traZODone (DESYREL) tablet 100 mg  100 mg Oral Annye English, MD       Current Outpatient Medications  Medication Sig Dispense Refill   fluPHENAZine (PROLIXIN) 10 MG tablet Take 1 tablet (10 mg total) by mouth in the morning. 30 tablet 0    QUETIAPINE FUMARATE PO Take 1 tablet by mouth at bedtime.     traZODone (DESYREL) 100 MG tablet Take 1 tablet (100 mg total) by mouth at bedtime. 30 tablet 0   cetirizine (ZYRTEC) 10 MG tablet Take 10 mg by mouth daily as needed for allergies. (Patient not taking: Reported on 03/13/2022)     divalproex (DEPAKOTE) 500 MG DR tablet Take 1 tablet (500 mg total) by mouth 2 (two) times daily. (Patient not taking: Reported on 12/29/2021) 60 tablet 0   methocarbamol (ROBAXIN) 500 MG tablet Take 1 tablet (500 mg total) by mouth 2 (two) times daily as needed for muscle spasms. (Patient not taking: Reported on 03/13/2022) 10 tablet 0   Musculoskeletal: Strength & Muscle Tone: within normal limits Gait & Station: normal Patient leans: N/A   Psychiatric Specialty Exam: Presentation   General Appearance: Appropriate for Environment; Disheveled  Eye Contact:Good  Speech:Clear and Coherent; Normal Rate  Speech Volume:Normal  Handedness:Right  Mood and Affect  Mood:Euthymic  Affect:Appropriate; Congruent  Thought Process   Thought Processes:Coherent; Goal Directed  Descriptions of Associations:Intact  Orientation:Full (Time, Place and Person)   Thought Content:Logical  History of Schizophrenia/Schizoaffective disorder:Yes  Duration of Psychotic Symptoms: Greater than two weeks.  Hallucinations:Hallucinations: Auditory (Chronic problem.) Description of Auditory Hallucinations: "I hear voices telling me to go sell drugs".  Ideas of Reference:None  Suicidal Thoughts:Suicidal Thoughts: No  Homicidal Thoughts:Homicidal Thoughts: No  Sensorium  Memory:Immediate Good; Recent Good; Remote Good  Judgment:Good  Insight:Good  Executive Functions   Concentration:Good  Attention Span:Good  Recall:Good  Fund of Knowledge:Fair  Language:Good  Psychomotor Activity  Psychomotor Activity:Psychomotor Activity: Normal  Assets  Assets:Desire for Improvement; Manufacturing systems engineer;  Resilience; Social Support; Housing; Financial Resources/Insurance  Sleep  Sleep:Sleep: Good Number of Hours of Sleep: 7  Physical Exam: Physical Exam Vitals and nursing note reviewed.  HENT:     Mouth/Throat:     Pharynx: Oropharynx is clear.  Cardiovascular:     Rate and Rhythm: Normal rate.     Pulses: Normal pulses.  Pulmonary:     Effort: Pulmonary effort is normal.  Genitourinary:    Comments: Deferred Musculoskeletal:        General: Normal range of motion.     Cervical back: Normal range of motion.  Skin:    General: Skin is warm.  Neurological:     General: No focal deficit present.     Mental Status: He is alert and oriented to person, place, and time. Mental status is at baseline.    Review of Systems  Constitutional:  Negative for chills, diaphoresis and fever.  Respiratory:  Negative for cough, shortness of breath and wheezing.   Cardiovascular:  Negative for chest pain and palpitations.  Gastrointestinal:  Negative for abdominal pain, constipation, diarrhea, heartburn, nausea and vomiting.  Neurological:  Negative for dizziness, tingling, tremors, sensory change, speech change, focal weakness,  seizures, loss of consciousness, weakness and headaches.  Endo/Heme/Allergies:        Allergies: Hydroxyzine (Vistaril), Lithium, Risperdal.  Psychiatric/Behavioral:  Positive for hallucinations (AH (+)" telling me to go sell drugs"). Negative for depression, memory loss, substance abuse and suicidal ideas. The patient is not nervous/anxious and does not have insomnia.    Blood pressure 121/84, pulse 98, temperature 97.7 F (36.5 C), temperature source Oral, resp. rate 18, SpO2 93 %. There is no height or weight on file to calculate BMI.  Medical Decision Making: Psychiatric cleared. Patient does not meet criteria for inpatient admission. He is already an established mental health patient linked with the [facility] Act Team. He is on a bi-weekly prolixin  injections which is current.  Problem 1: Schizoaffective disorder, bipolar-type.  Problem 2: Schizophrenia, undifferentiated.  Problem 3: NA  Disposition: No evidence of imminent risk to self or others at present.   Patient does not meet criteria for psychiatric inpatient admission. Discussed crisis plan, support from social network, calling 911, coming to the Emergency Department, and calling Suicide Hotline. Will continue mental health treatment with his Act team from the [facility].  Armandina Stammer, NP, pmhnp, fnp-bc. 03/13/2022 3:14 PM

## 2022-03-13 NOTE — Discharge Instructions (Signed)
For your behavioral health needs you are advised to continue treatment with the [facility] ACT Team: ? ?     [facility] ?     [address] ?     [place], [place] [address] ?     Office: [phone] ?     Crisis: [phone]  ?

## 2022-03-13 NOTE — BH Assessment (Addendum)
[facility] Assessment Progress Note   Per Serena Colonel, NP, this voluntary pt does not require psychiatric hospitalization at this time.  Pt is psychiatrically cleared.  Discharge instructions advise pt to continue treatment with the [facility] ACT Team.  At 14:01 this writer called them and spoke to [place], informing her of disposition.  She reports that they will not be able to pick pt up today.  EDP Linwood Dibbles, MD, Aggie and pt's nurse, [place], have been notified.  Doylene Canning, MA Triage Specialist [phone]

## 2022-03-13 NOTE — ED Notes (Signed)
Gold and red tube sent down to main lab

## 2022-03-13 NOTE — ED Notes (Signed)
Called patient's brother, agreed to pick pt up at 5 on his way home from appointment in [place].

## 2023-04-21 ENCOUNTER — Other Ambulatory Visit: Payer: Self-pay | Admitting: Internal Medicine

## 2023-04-22 LAB — CBC
HCT: 41.5 % (ref 38.5–50.0)
Hemoglobin: 14.5 g/dL (ref 13.2–17.1)
MCH: 31.5 pg (ref 27.0–33.0)
MCHC: 34.9 g/dL (ref 32.0–36.0)
MCV: 90.2 fL (ref 80.0–100.0)
MPV: 13.6 fL — ABNORMAL HIGH (ref 7.5–12.5)
Platelets: 131 10*3/uL — ABNORMAL LOW (ref 140–400)
RBC: 4.6 10*6/uL (ref 4.20–5.80)
RDW: 13 % (ref 11.0–15.0)
WBC: 3.9 10*3/uL (ref 3.8–10.8)

## 2023-04-22 LAB — TSH: TSH: 2.38 mIU/L (ref 0.40–4.50)

## 2023-04-22 LAB — COMPLETE METABOLIC PANEL WITH GFR
AG Ratio: 1.1 (calc) (ref 1.0–2.5)
ALT: 19 U/L (ref 9–46)
AST: 21 U/L (ref 10–35)
Albumin: 4.2 g/dL (ref 3.6–5.1)
Alkaline phosphatase (APISO): 105 U/L (ref 35–144)
BUN: 13 mg/dL (ref 7–25)
CO2: 21 mmol/L (ref 20–32)
Calcium: 9.2 mg/dL (ref 8.6–10.3)
Chloride: 104 mmol/L (ref 98–110)
Creat: 1.19 mg/dL (ref 0.70–1.30)
Globulin: 3.8 g/dL (calc) — ABNORMAL HIGH (ref 1.9–3.7)
Glucose, Bld: 95 mg/dL (ref 65–99)
Potassium: 3.9 mmol/L (ref 3.5–5.3)
Sodium: 138 mmol/L (ref 135–146)
Total Bilirubin: 0.4 mg/dL (ref 0.2–1.2)
Total Protein: 8 g/dL (ref 6.1–8.1)
eGFR: 74 mL/min/{1.73_m2} (ref >=60)

## 2023-04-22 LAB — LIPID PANEL
Cholesterol: 231 mg/dL — ABNORMAL HIGH (ref <200)
HDL: 42 mg/dL (ref >=40)
LDL Cholesterol (Calc): 166 mg/dL (calc) — ABNORMAL HIGH
Non-HDL Cholesterol (Calc): 189 mg/dL (calc) — ABNORMAL HIGH (ref <130)
Total CHOL/HDL Ratio: 5.5 (calc) — ABNORMAL HIGH (ref <5.0)
Triglycerides: 112 mg/dL (ref <150)

## 2023-04-22 LAB — VITAMIN D 25 HYDROXY (VIT D DEFICIENCY, FRACTURES): Vit D, 25-Hydroxy: 31 ng/mL (ref 30–100)

## 2023-04-22 LAB — PSA: PSA: 0.25 ng/mL (ref <=4.00)
